# Patient Record
Sex: Female | Born: 1970 | Race: White | Hispanic: No
Health system: Midwestern US, Academic
[De-identification: ages and names within clinical notes are randomized; demographics above are authoritative.]

---

## 2014-03-22 IMAGING — MG SCR BIL
5 series · 5 of 5 positions shown · non-contrast
Comparison: February 29, 2008.
COMPARISON: February 29, 2008.

------------- REPORT [id number] -------------
DIGITAL MAMMOGRAM

INDICATIONS:
Screening
Family history OF carcinoma of the breast in maternal aunt.
TECHNIQUE: Four standard projections plus supplemental left MLO projection with CAD.

[RCC]
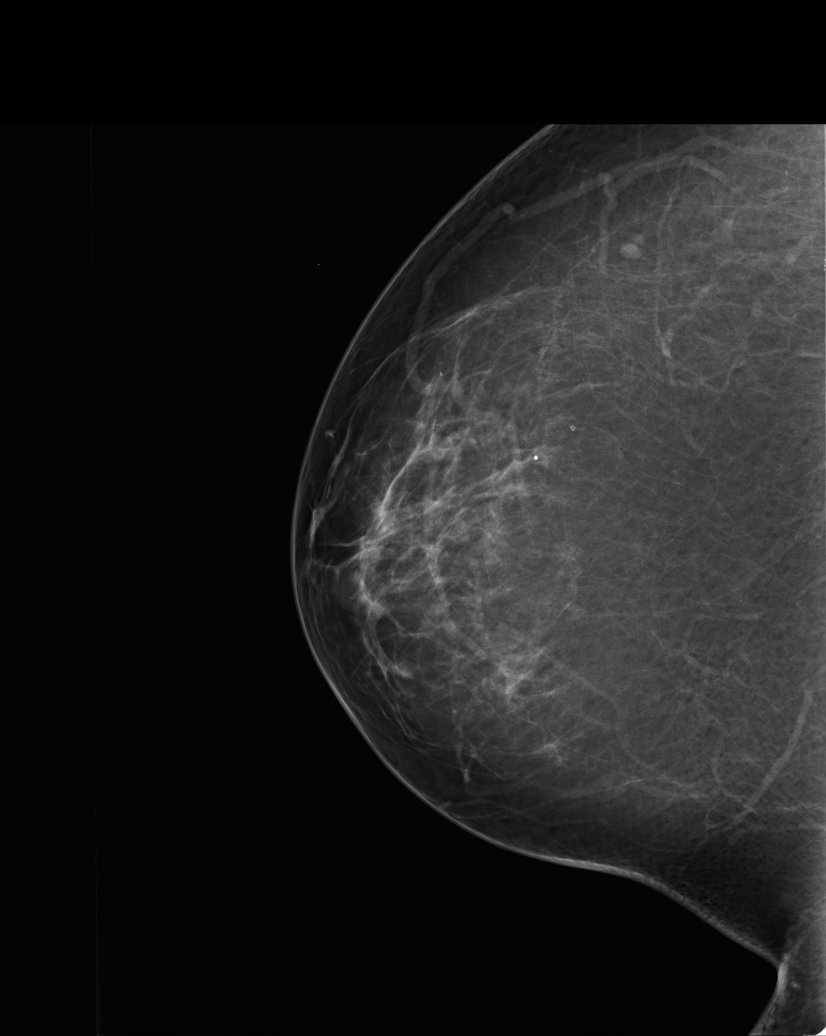

[LCC]
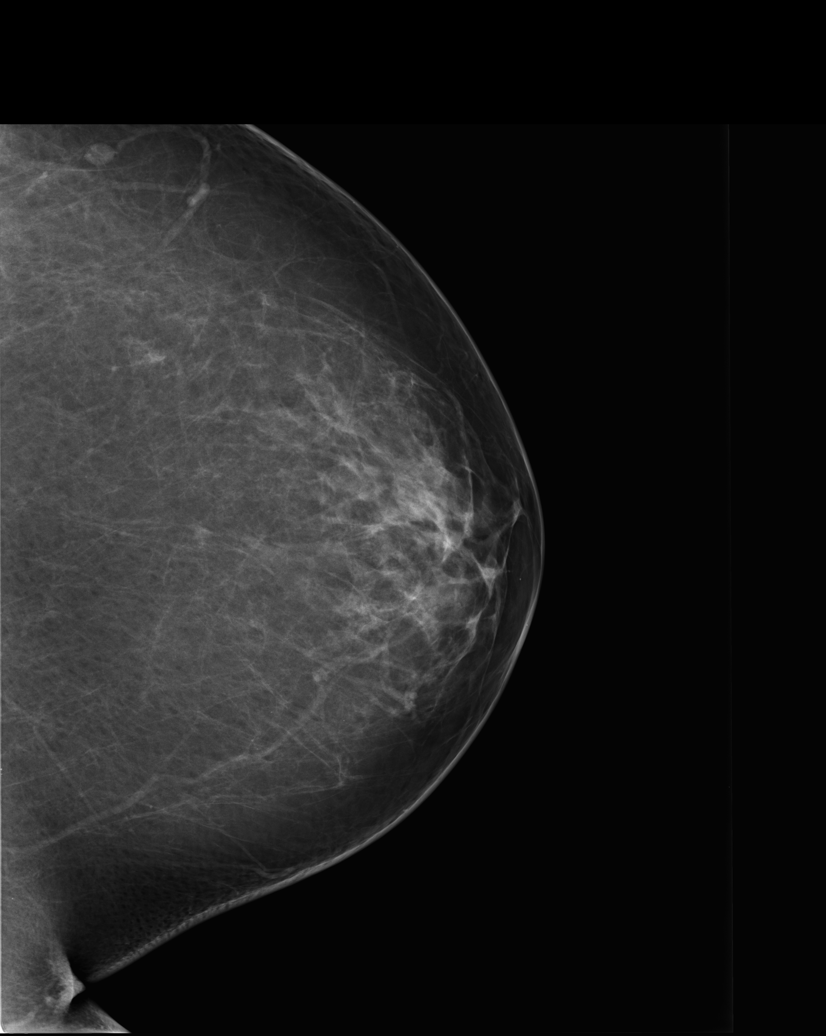

[RMLO]
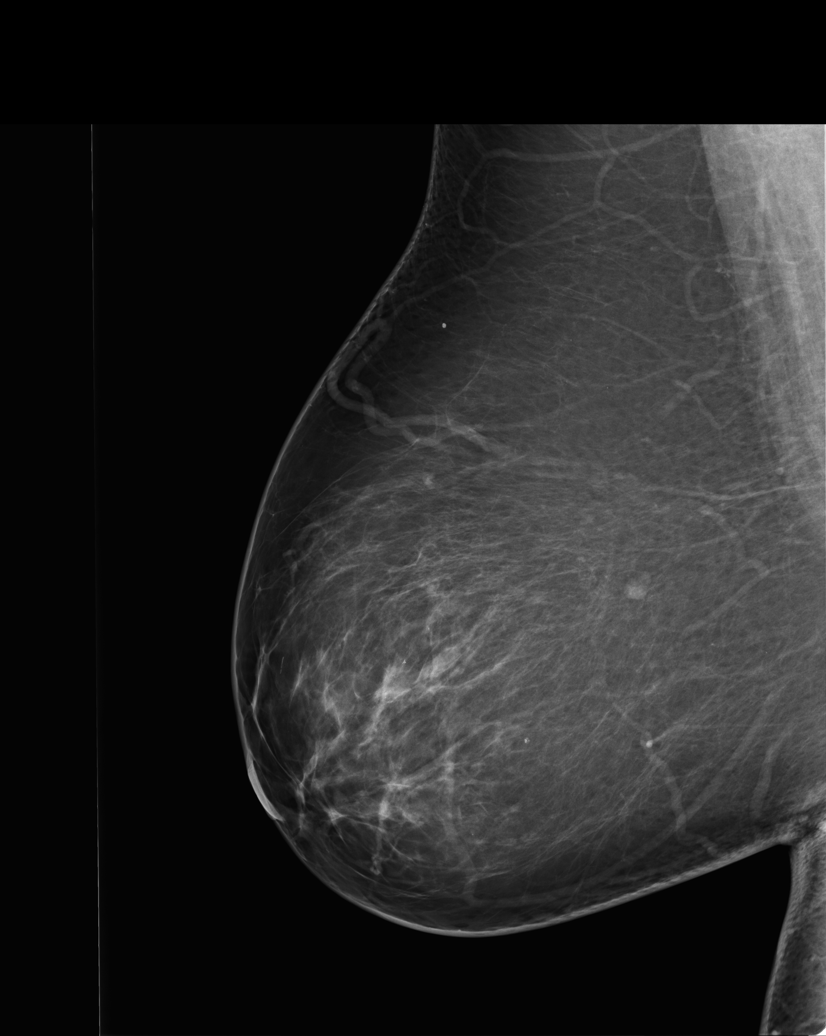

[LMLO (1 of 2)]
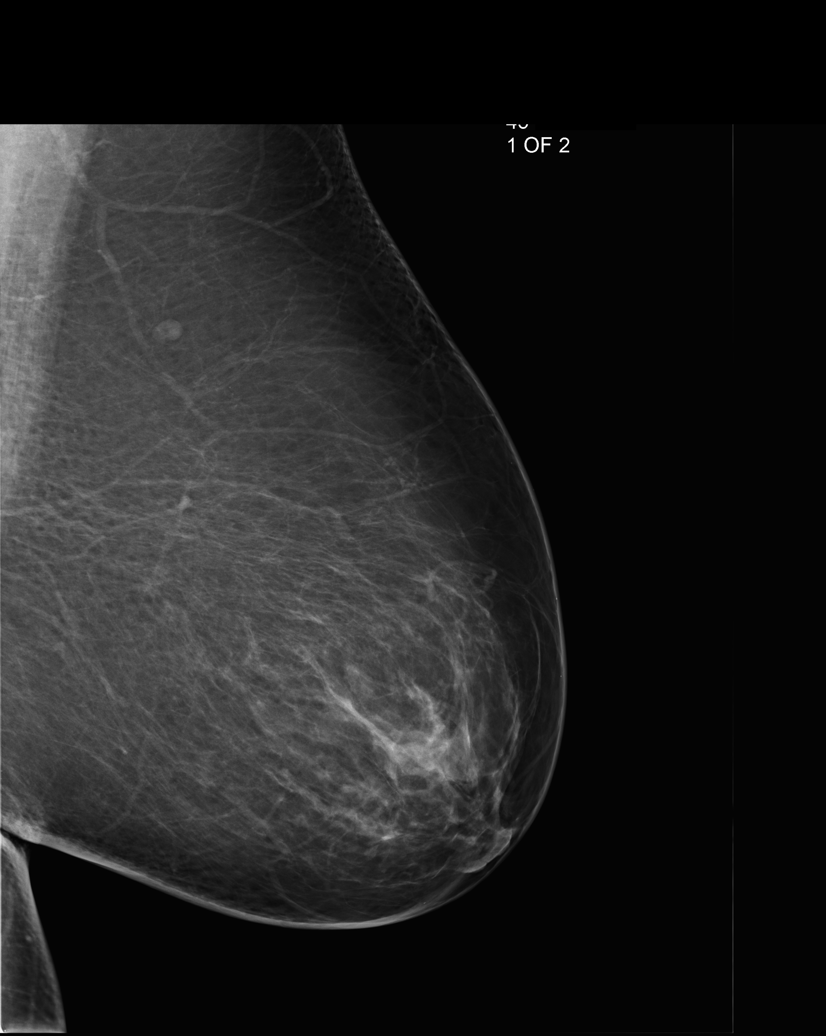

[LMLO (2 of 2)]
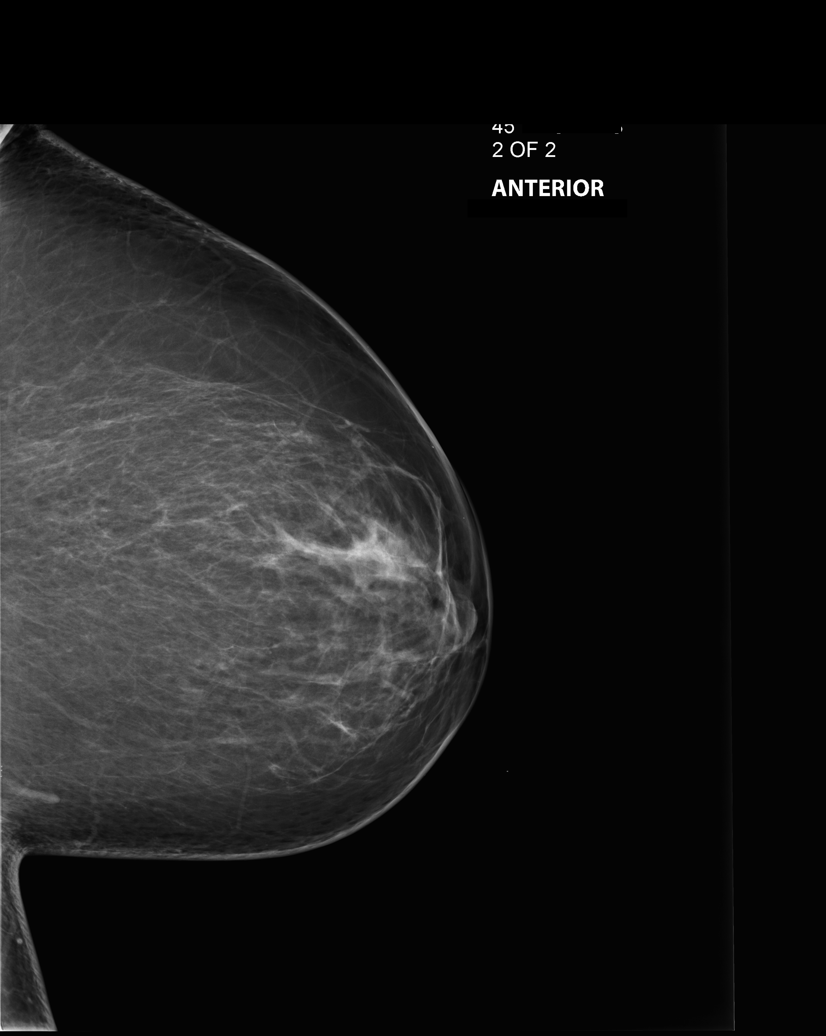

[5 of 5 positions shown; findings below may reference images not displayed]

FINDINGS: The breasts are largely fatty replaced
There are a few scattered involutional calcifications.
There are stable small intraparenchymal lymph nodes of the upper outer
quadrant of the left breast and lateral aspect of the right breast.
There is no new, dominant, nor suspicious parenchymal asymmetry or
calcification.
IMPRESSION: Benign findings.
Annual mammographic follow up is recommended.
The absence of a significant mammographic finding should not delay biopsy of
any clinically suspicious lesion.

Job:  [id number]

BI-RADS:  2 - BENIGN FINDING

FOLLOWUP:  12 MONTH FOLLOW-UP

Tech Notes: FM HX.:  MATERNAL AUNT DECEASED IN HER 30s - HAD BRAIN AND BREAST CA- NOT SURE WHICH
WAS FIRST.  AB
(LB5)

------------- REPORT [id number] -------------
DIGITAL MAMMOGRAM

INDICATIONS:
Screening
Family history OF carcinoma of the breast in maternal aunt.
FINDINGS: The breasts are largely fatty replaced
There are a few scattered involutional calcifications.
There are stable small intraparenchymal lymph nodes of the upper outer
quadrant of the left breast and lateral aspect of the right breast.
There is no new, dominant, nor suspicious parenchymal asymmetry or
calcification.
IMPRESSION: Benign findings.
Annual mammographic follow up is recommended.
The absence of a significant mammographic finding should not delay biopsy of
any clinically suspicious lesion.

Job:  [id number]

BI-RADS:  2 - BENIGN FINDING

FOLLOWUP:  12 MONTH FOLLOW-UP

Tech Notes: FM HX.:  MATERNAL AUNT DECEASED IN HER 30s - HAD BRAIN AND BREAST CA- NOT SURE WHICH
WAS FIRST.  AB
(LB5)

## 2014-09-23 IMAGING — CT Abdomen^1_ABDOMEN_PELVIS_WITHOUT (Adult)
1 series · 15 of 32 positions shown, 19 images · non-contrast
Comparison: none

PROCEDURE: ABDOMEN 1_ABDOMEN_PELVIS_WITHOUT (ADULT)
HISTORY: RT SIDE ABD PAIN. HX OF CHOLE, APPY, AND HYSTERECTOMY.
TECHNIQUE: Axial CT imaging of the abdomen and pelvis was performed without contrast.

[Series 2: abd/pelvis w/o 5.0 soft tissue · axial · non-contrast · 0.88mm/px · z∈[-487,-67]mm · 15 of 94 slices shown, 19 images]
[im 7/94  soft-tissue]
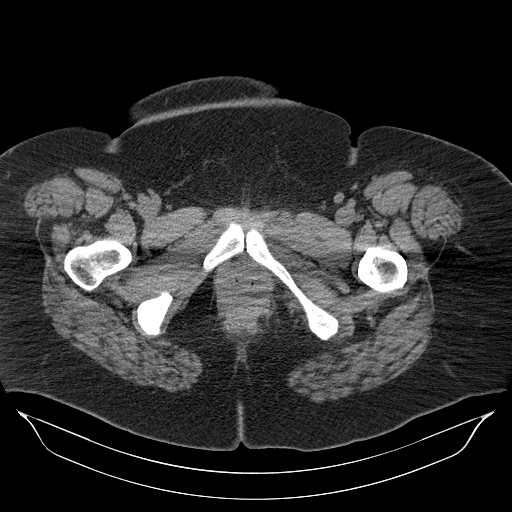
[im 7/94  bone]
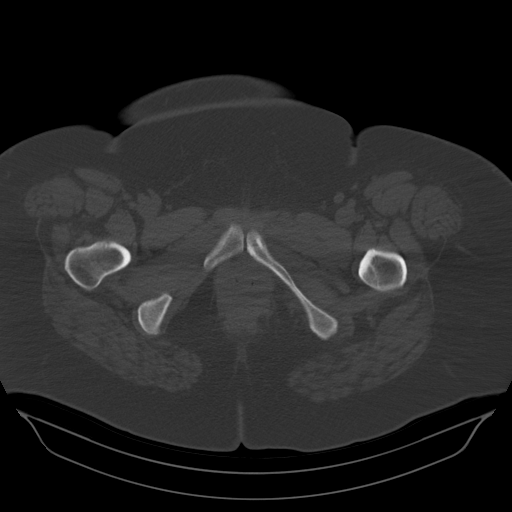
[im 13/94  soft-tissue]
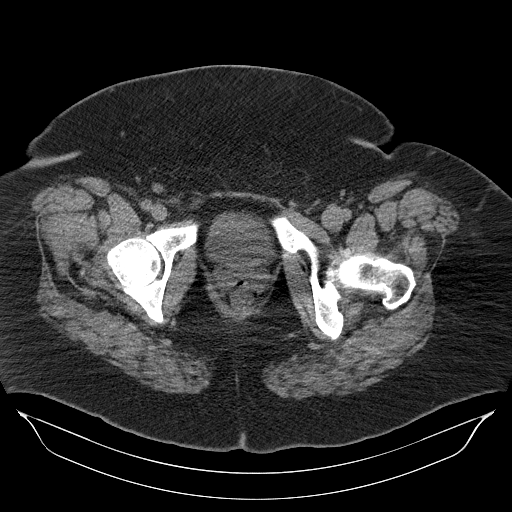
[im 19/94  soft-tissue]
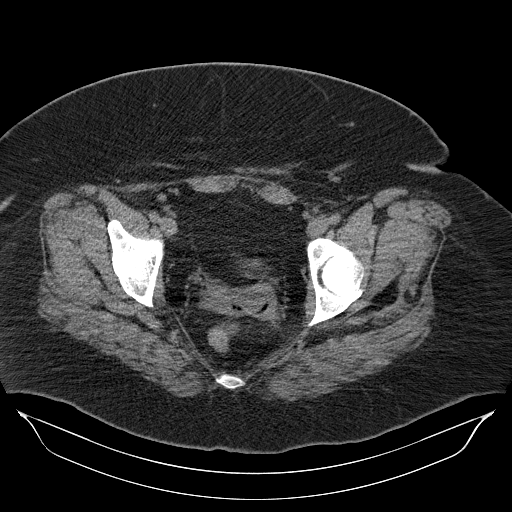
[im 28/94  soft-tissue]
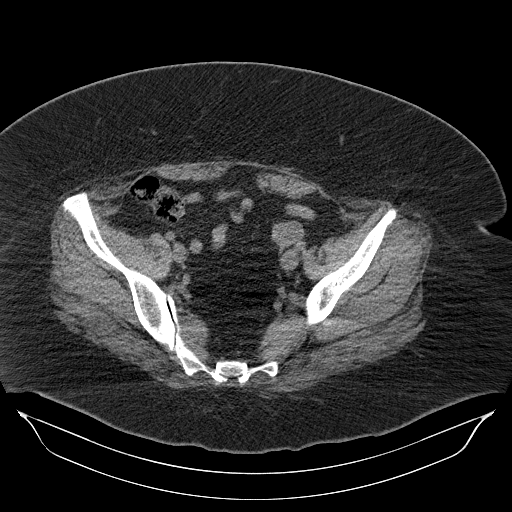
[im 34/94  soft-tissue]
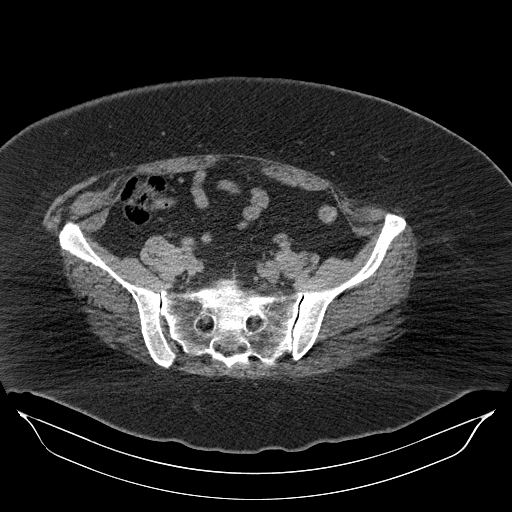
[im 40/94  soft-tissue]
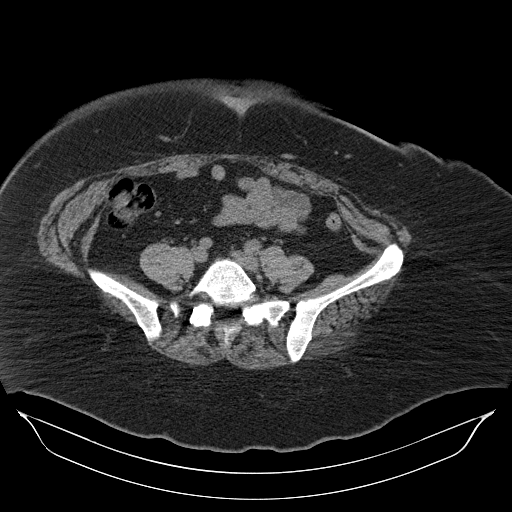
[im 49/94  soft-tissue]
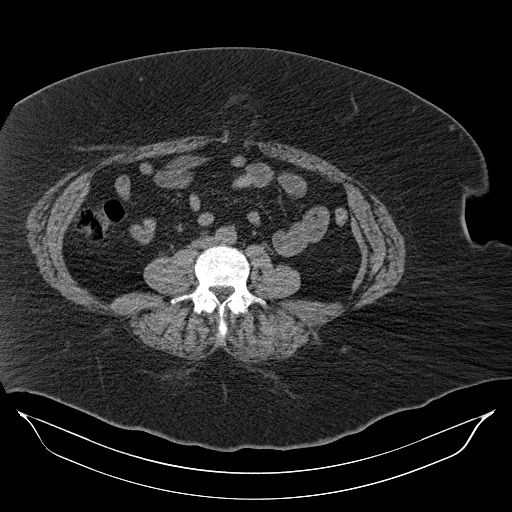
[im 55/94  soft-tissue]
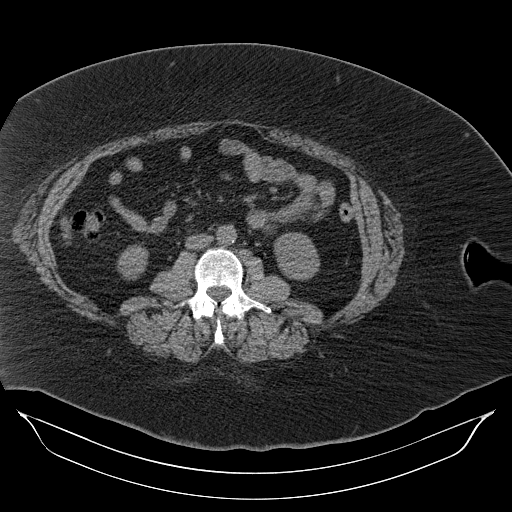
[im 61/94  soft-tissue]
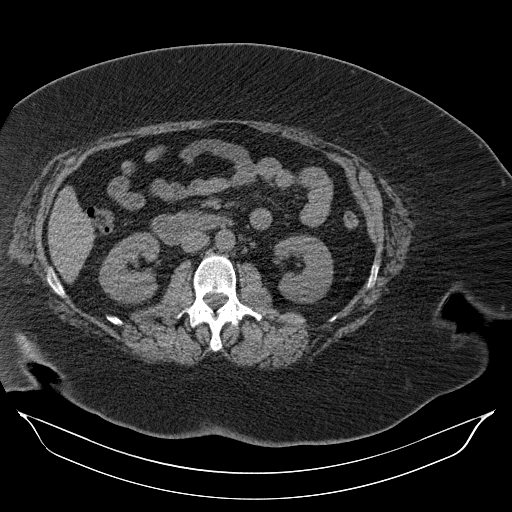
[im 61/94  bone]
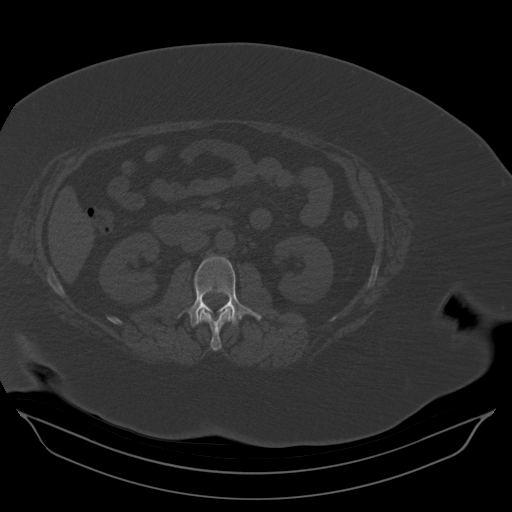
[im 67/94  soft-tissue]
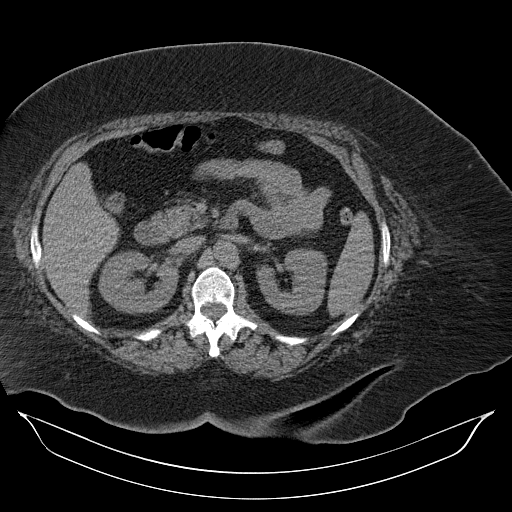
[im 76/94  soft-tissue]
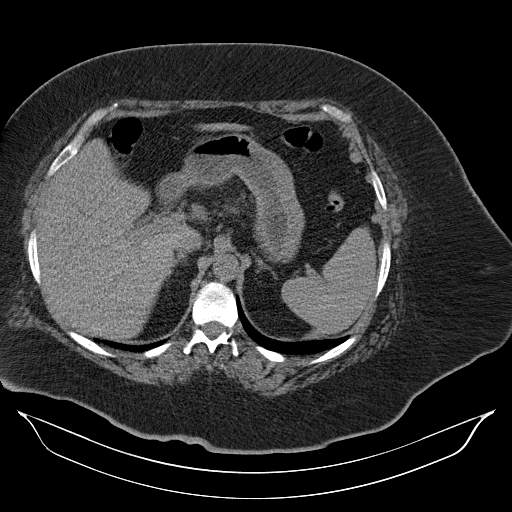
[im 82/94  soft-tissue]
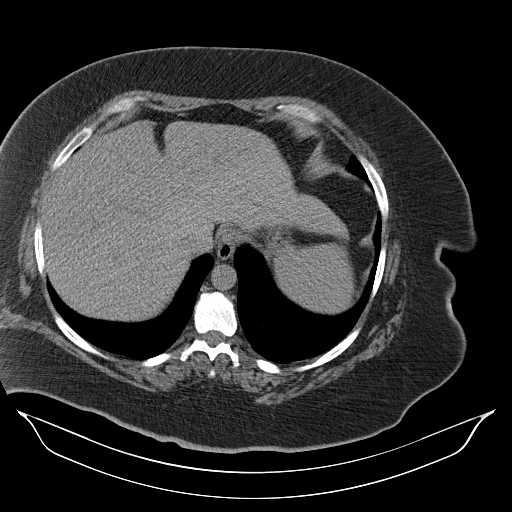
[im 82/94  lung]
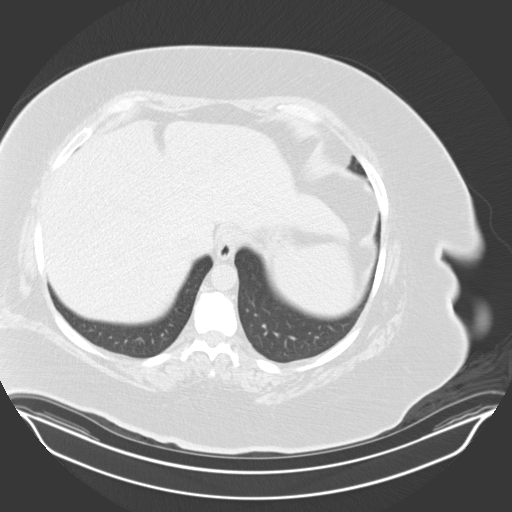
[im 85/94  lung]
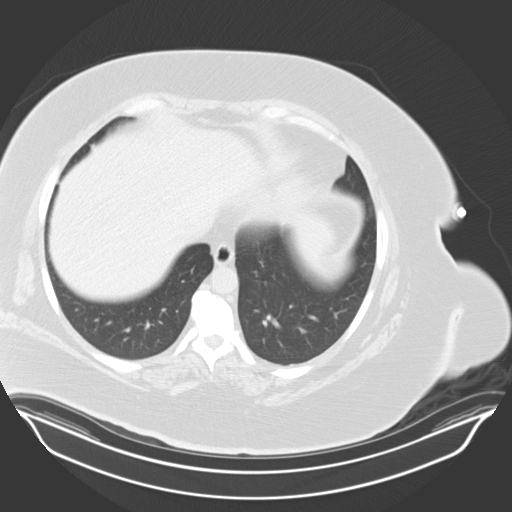
[im 88/94  soft-tissue]
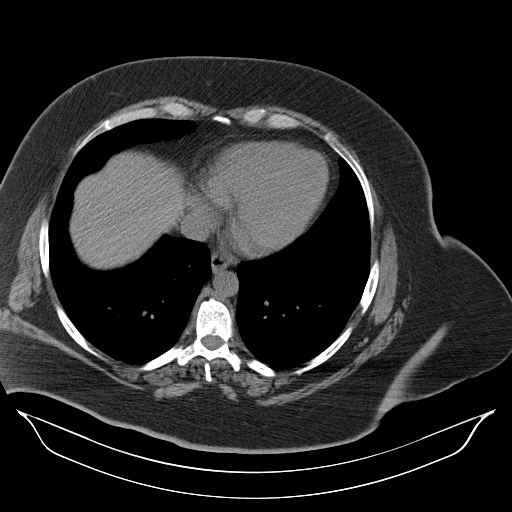
[im 88/94  lung]
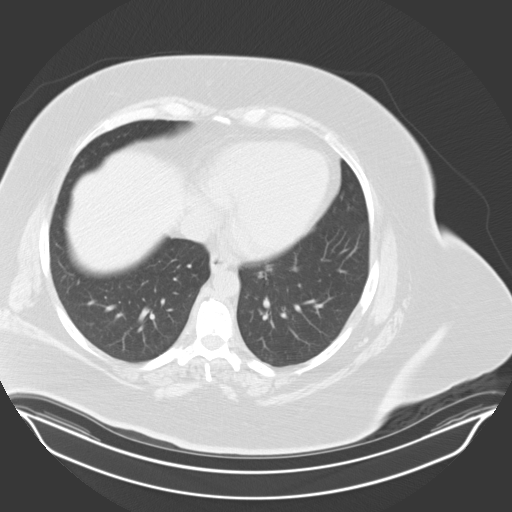
[im 91/94  lung]
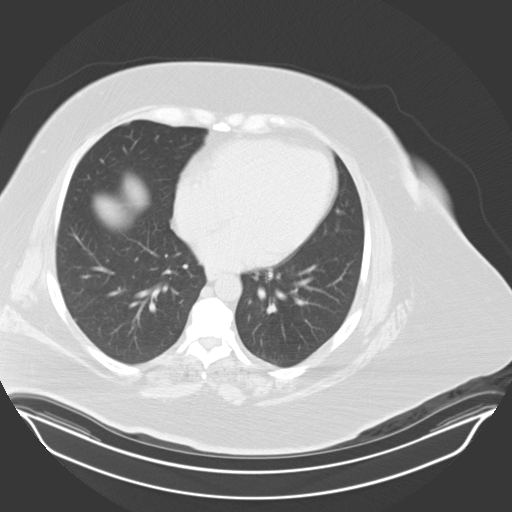

[15 of 32 positions shown; findings below may reference images not displayed]

FINDINGS: The lung bases are clear. Surgical changes are seen reflecting cholecystectomy. The noncontrast
visualized portions of the liver, spleen, gallbladder, pancreas, and adrenals are within normal
limits.
The right kidney shows no hydronephrosis, perinephric fat stranding, or obstructing renal calculi.
The
left kidney shows no hydronephrosis, perinephric fat stranding, or obstructing renal calculi. The
aorta
is normal in course and caliber. No pathologically dilated loops of large or small bowel are seen.
Findings are seen consistent with appendectomy. There is no free air or free fluid seen in the
abdomen. The noncontrast urinary bladder is unremarkable.
IMPRESSION: 1. There is no hydronephrosis, perinephric fat stranding, or obstructing renal calculi.
2. Nonobstructive bowel gas pattern without free air.

Tech Notes: RT SIDE ABD PAIN. HX OF CHOLE, APPY, HYSTERECTOMY. TM

## 2017-01-18 IMAGING — CR LOW_EXM
3 series · 3 of 3 positions shown · non-contrast
Comparison: none

[ankle ap]
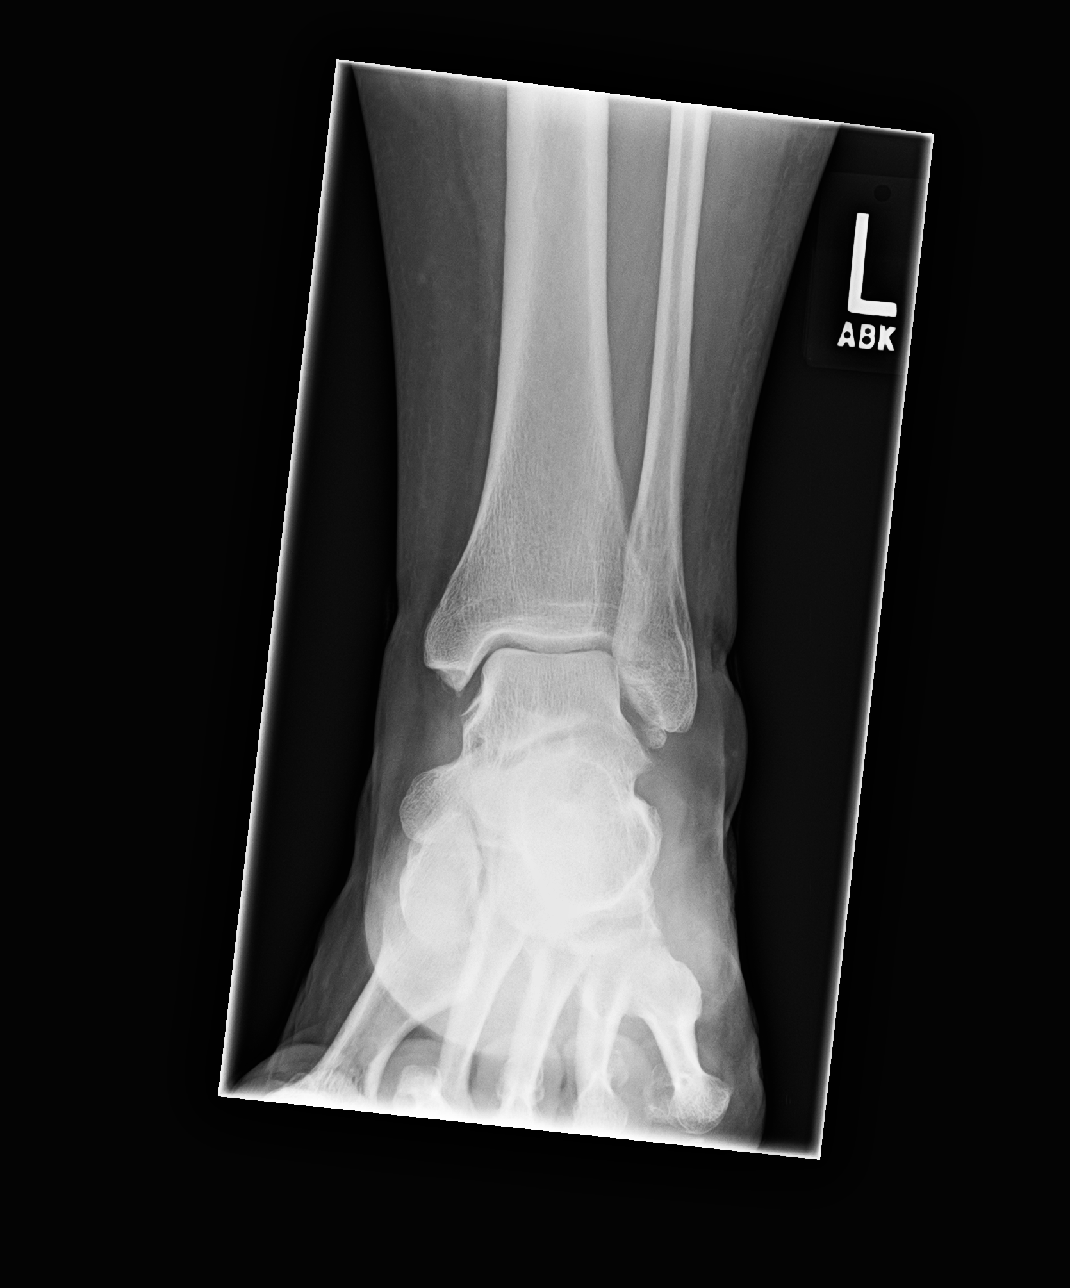

[ankle obl]
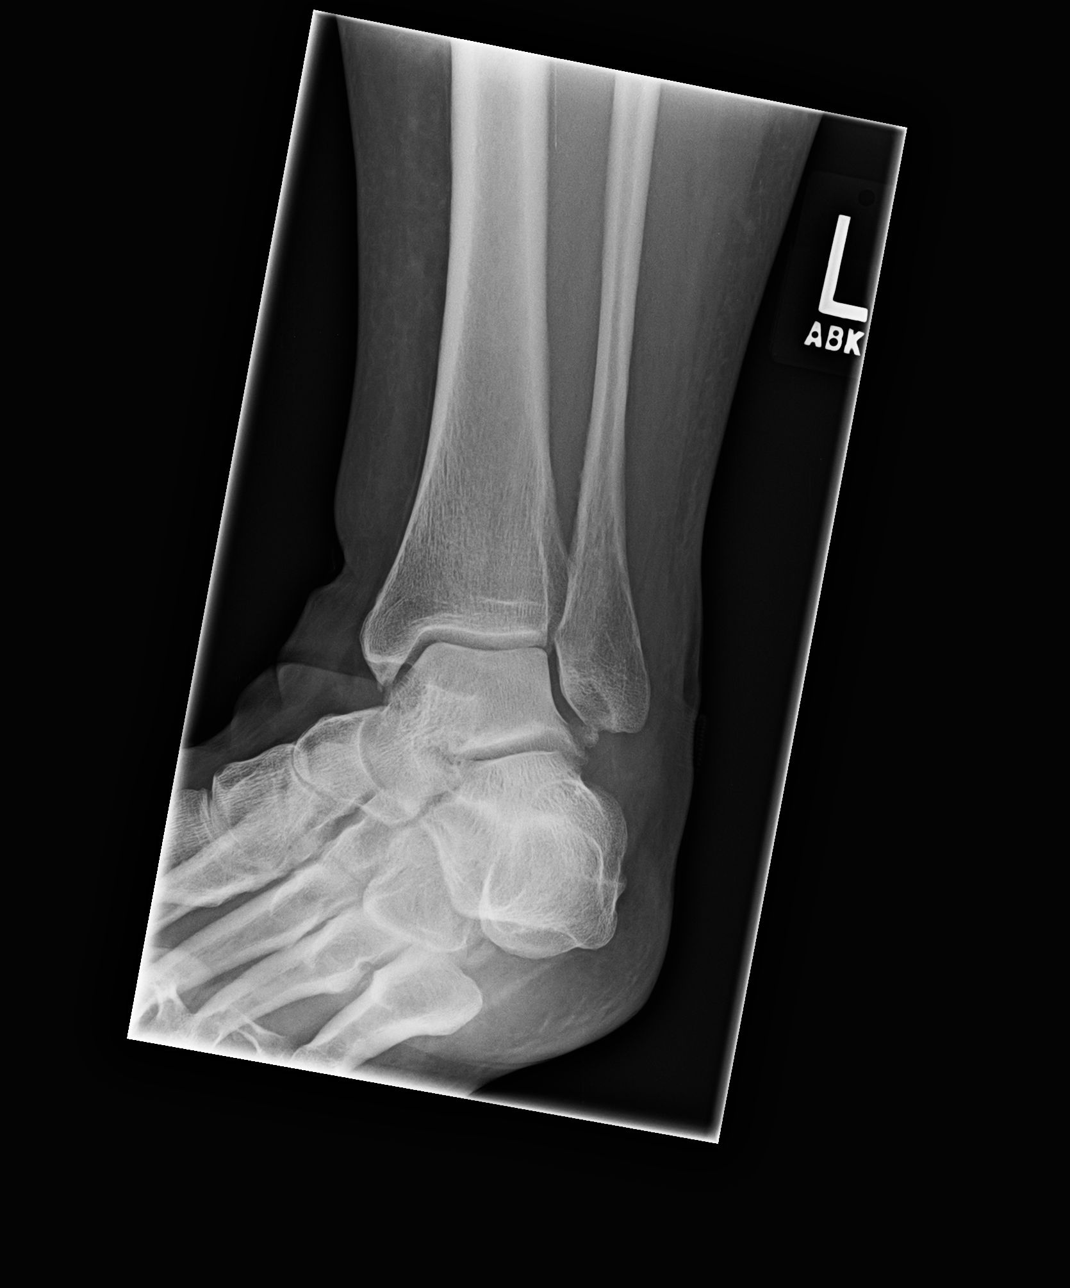

[ankle lat]
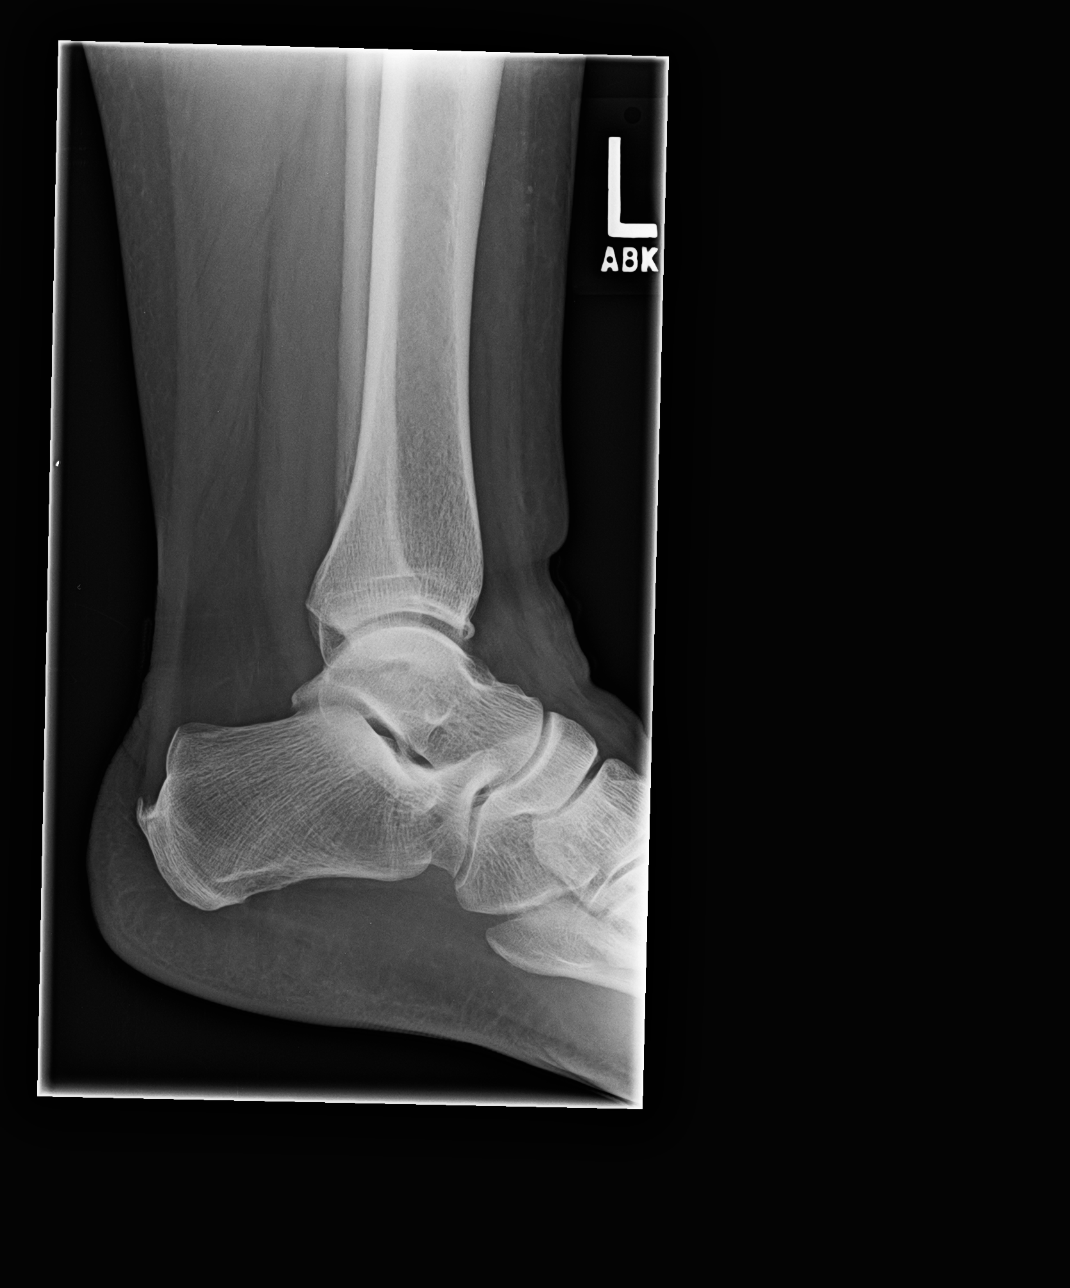

[3 of 3 positions shown; findings below may reference images not displayed]

EXAM

RADIOLOGIC EXAMINATION, ANKLE; 3 OR MORE VIEWS, COMPLETE CPT 19217

INDICATION

pain post fall
FELL AND TWISTED LEFT ANKLE, PAIN LATERALLY - AK

TECHNIQUE

AP, lateral and oblique views of the left ankle were acquired.

COMPARISONS

There are no previous examinations available at the time of dictation

FINDINGS

There is soft tissue swelling of the left ankle. There are no acute fractures. There are no
subluxations. Accessory ossicles are identified along the tip of the lateral malleolus. Calcaneal
spurs are identified along the insertion of the Achilles tendon and plantar fascia. The ankle
mortise appears intact. There is no evidence of an ankle joint effusion.

IMPRESSION

Soft tissue swelling of the left ankle suggestive of a left ankle sprain. No acute fractures.
Calcaneal spurs.

## 2018-07-07 IMAGING — MG MAMMOGRAM 3D SCREEN, BILATERAL
11 of 16 series · 11 of 16 positions shown · non-contrast
Comparison: none

[R CC (1 of 2)]
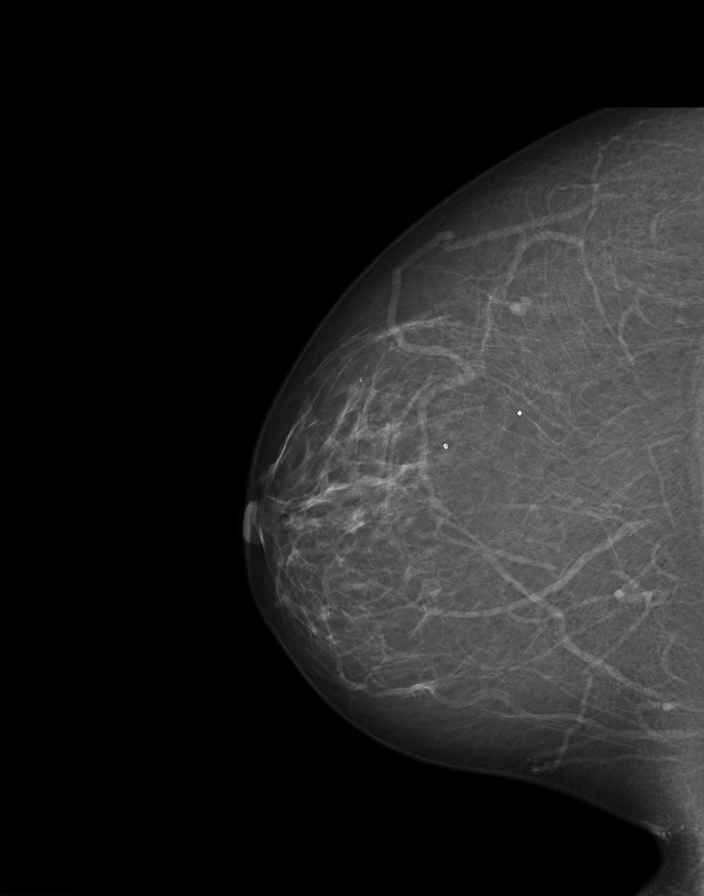

[R tomo (1 of 2)]
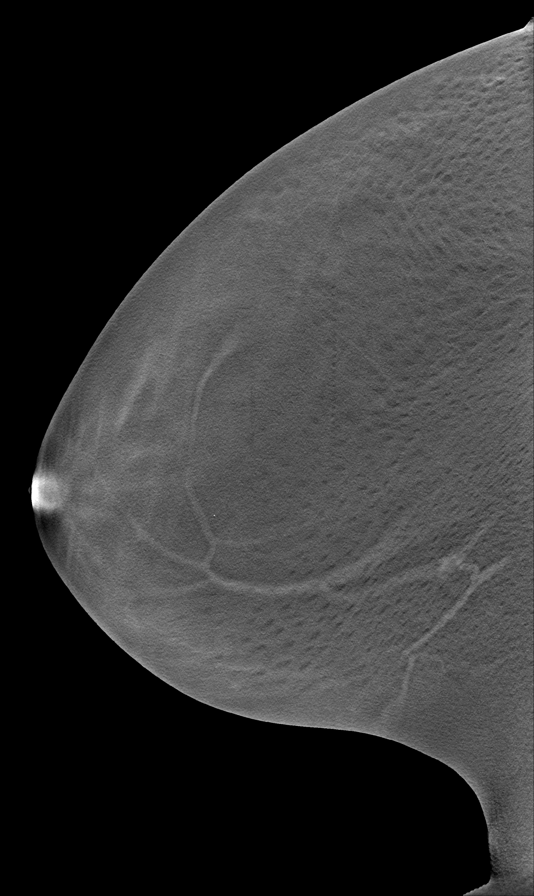

[R CC (2 of 2)]
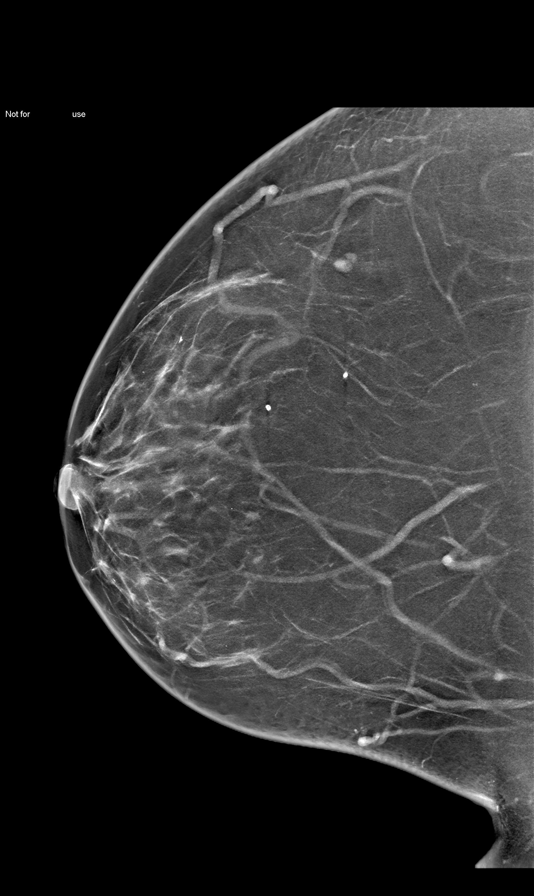

[R]
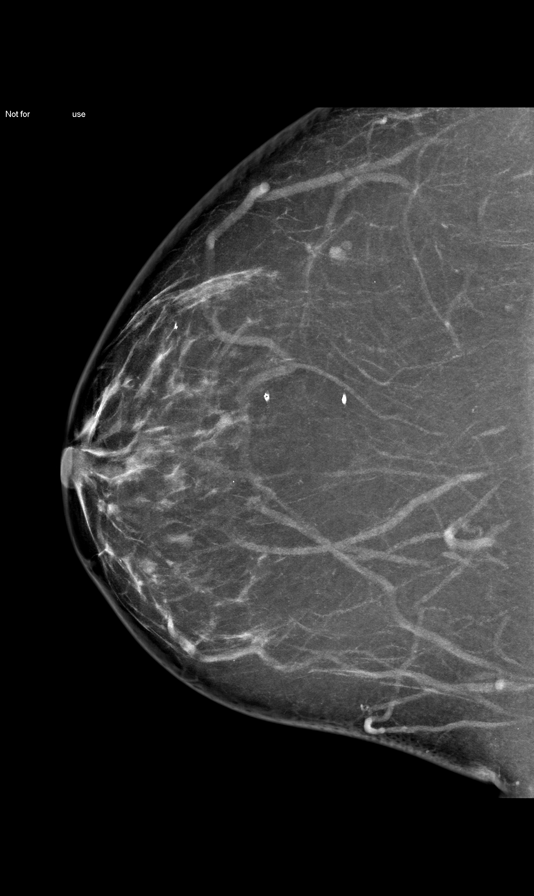

[L CC (1 of 2)]
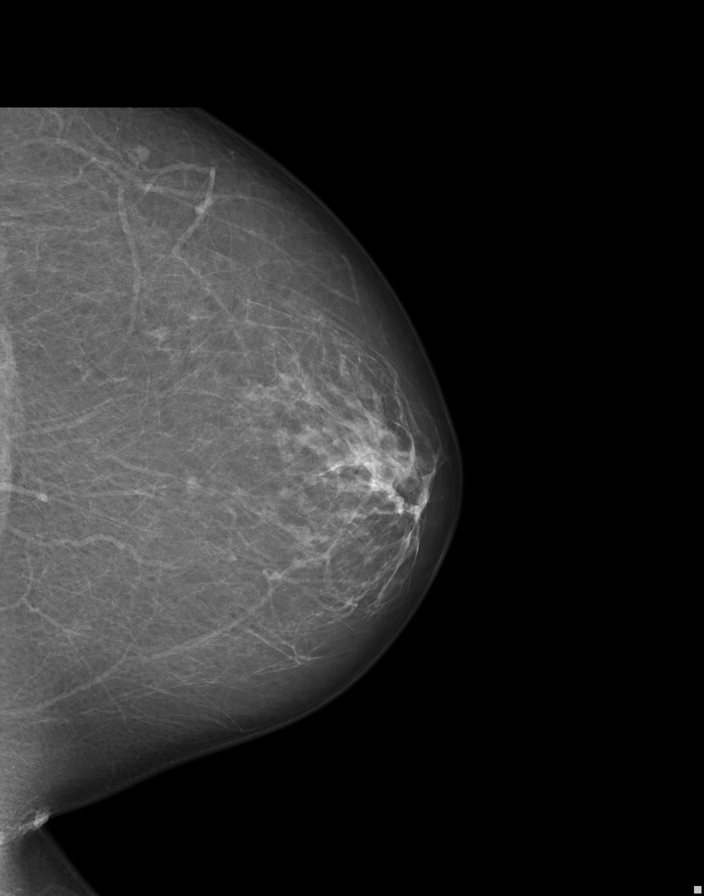

[L tomo]
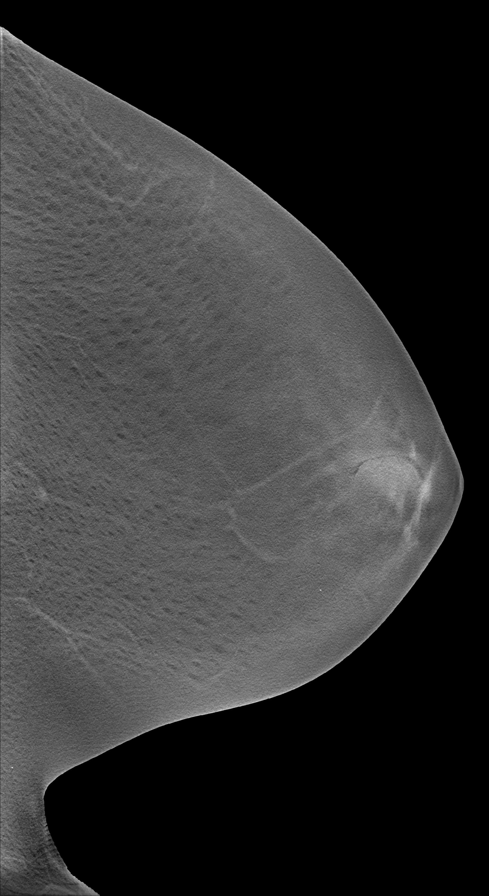

[L CC (2 of 2)]
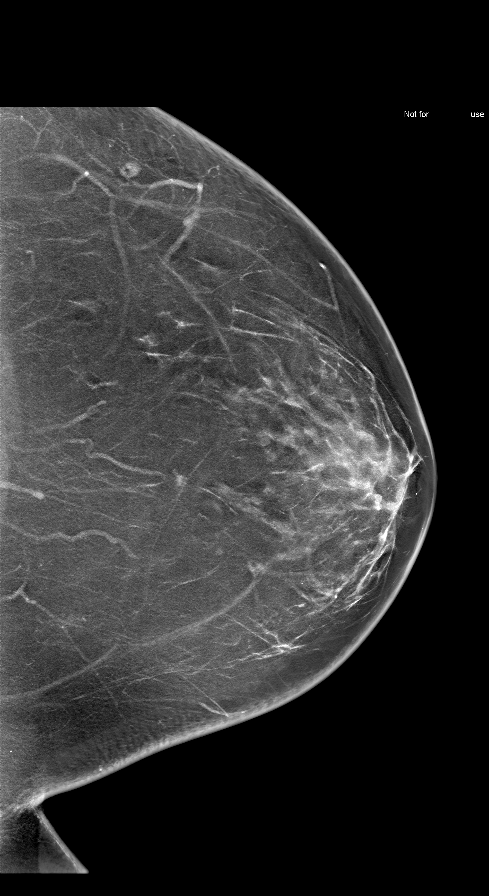

[L]
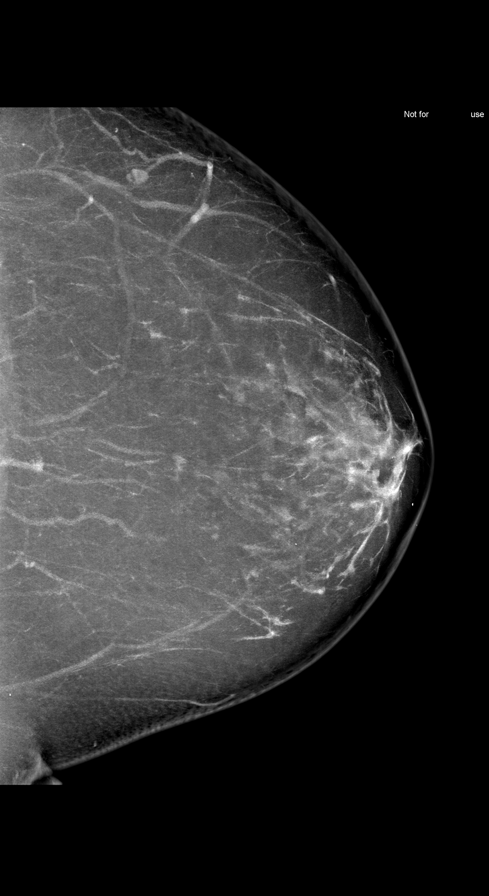

[R MLO (1 of 2)]
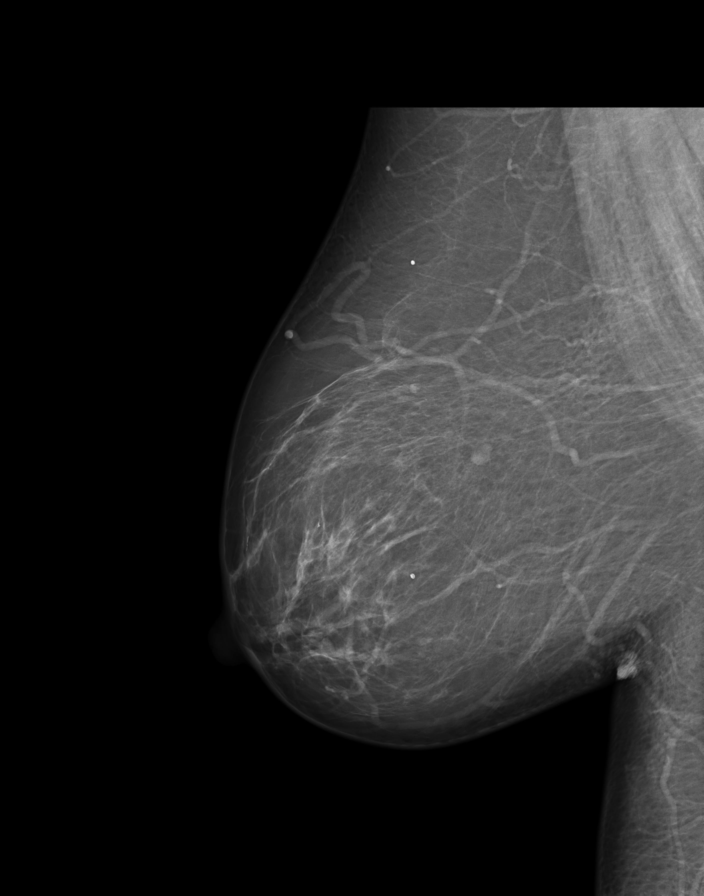

[R tomo (2 of 2)]
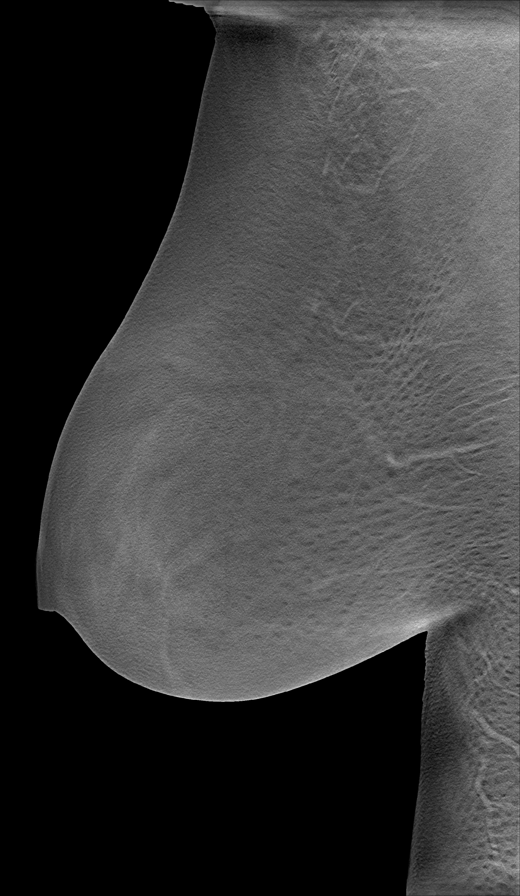

[R MLO (2 of 2)]
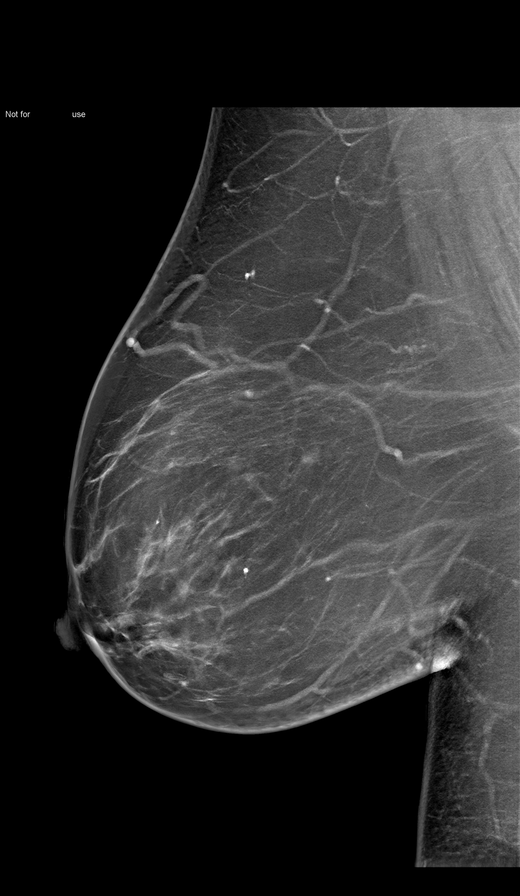

[11 of 16 positions shown; findings below may reference images not displayed]

EXAM
MM mammogram 3D screen bilat

INDICATION
Screening

TECHNIQUE
2D and 3D tomosynthesis digital craniocaudal and mediolateral oblique views were obtained of both
breasts. Computer aided detection software was utilized.

COMPARISONS
08/28/15

FINDINGS
The breasts are almost entirely fatty.

No suspicious microcalcification, mass, architectural distortion, or asymmetry.

IMPRESSION
BI-RADS 1, NEGATIVE.
A reminder letter will be sent.

Tech Notes:

## 2018-08-26 ENCOUNTER — Encounter: Admit: 2018-08-26 | Discharge: 2018-08-26 | Payer: Commercial Managed Care - PPO

## 2018-08-26 ENCOUNTER — Ambulatory Visit: Admit: 2018-08-26 | Discharge: 2018-08-31 | Payer: Commercial Managed Care - PPO

## 2018-08-26 ENCOUNTER — Inpatient Hospital Stay: Admit: 2018-08-26 | Discharge: 2018-08-26 | Payer: Commercial Managed Care - PPO

## 2018-08-26 DIAGNOSIS — G9389 Other specified disorders of brain: ICD-10-CM

## 2018-08-26 LAB — COMPREHENSIVE METABOLIC PANEL
Lab: 0.5 mg/dL (ref 0.3–1.2)
Lab: 0.7 mg/dL (ref 0.4–1.00)
Lab: 12 mg/dL (ref 7–25)
Lab: 138 MMOL/L (ref 137–147)
Lab: 18 U/L (ref 7–56)
Lab: 25 MMOL/L (ref 21–30)
Lab: 27 U/L (ref 7–40)
Lab: 3.6 g/dL (ref 3.5–5.0)
Lab: 40 U/L (ref 25–110)
Lab: 6.4 g/dL (ref 6.0–8.0)
Lab: 8 K/UL (ref 3–12)
Lab: 8.4 mg/dL — ABNORMAL LOW (ref 8.5–10.6)
Lab: 98 mg/dL (ref 70–100)
Lab: >60 mL/min (ref >60)
Lab: >60 mL/min (ref >60)

## 2018-08-26 LAB — PHOSPHORUS: Lab: 3 mg/dL (ref 2.0–4.5)

## 2018-08-26 LAB — PROTIME INR (PT): Lab: 1.1 M/UL (ref 0.8–1.2)

## 2018-08-26 LAB — PTT (APTT): Lab: 20 s — ABNORMAL LOW (ref 24.0–36.5)

## 2018-08-26 LAB — MAGNESIUM: Lab: 1.8 mg/dL (ref 1.6–2.6)

## 2018-08-26 LAB — CBC AND DIFF: Lab: 8.9 10*3/uL (ref 4.5–11.0)

## 2018-08-26 IMAGING — CT brain
3 of 4 series · 14 of 47 positions shown, 16 images · non-contrast
Comparison: none

[Series 2: brain 5.00 hr40 s3 ax ibhc · axial · 0.32mm/px · z∈[-434,-323]mm · 8 of 32 slices shown, 10 images]
[im 3/32  brain]
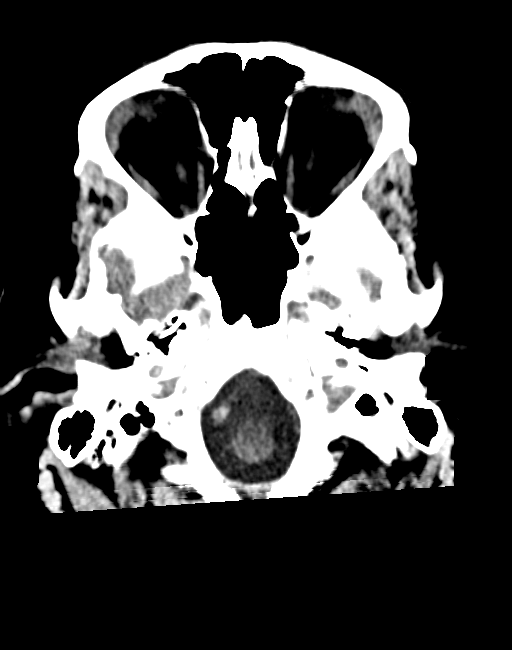
[im 3/32  bone]
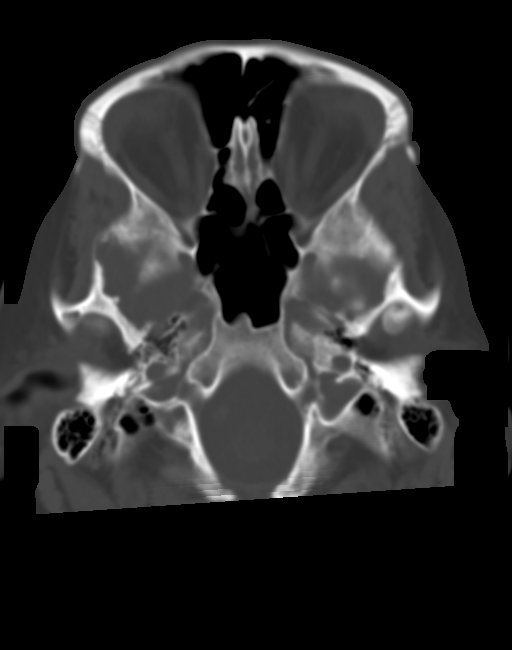
[im 7/32  brain]
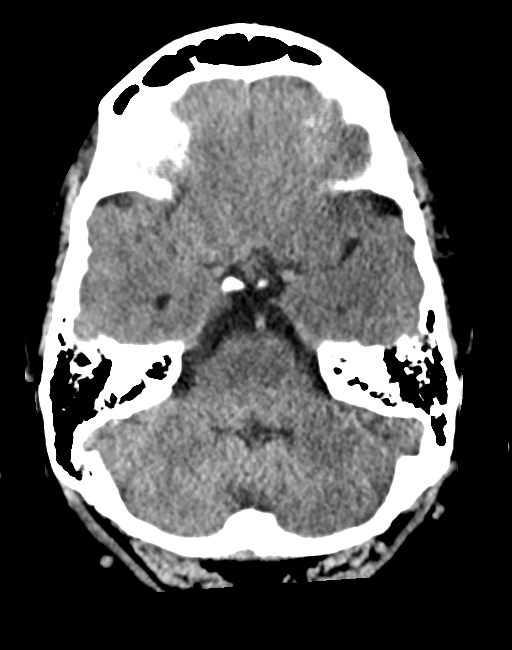
[im 12/32  brain]
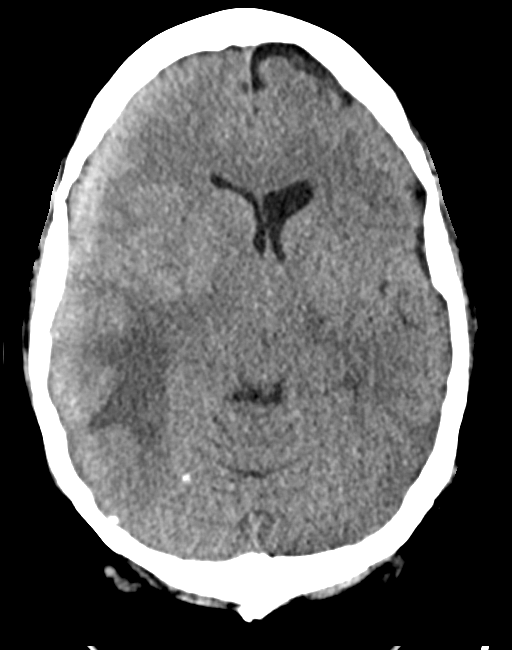
[im 14/32  brain]
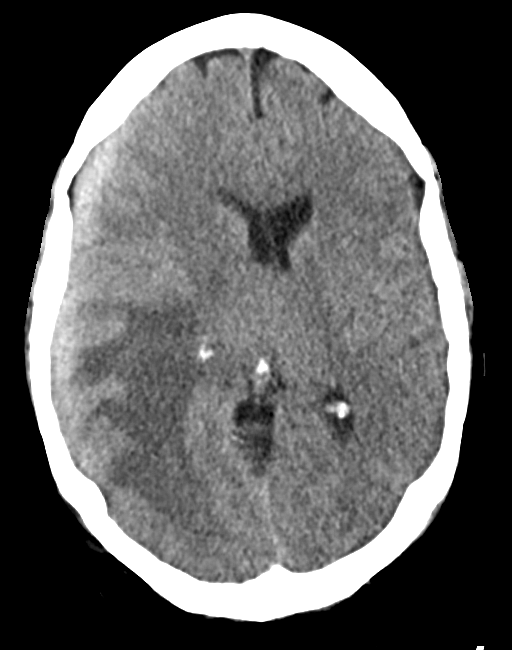
[im 18/32  brain]
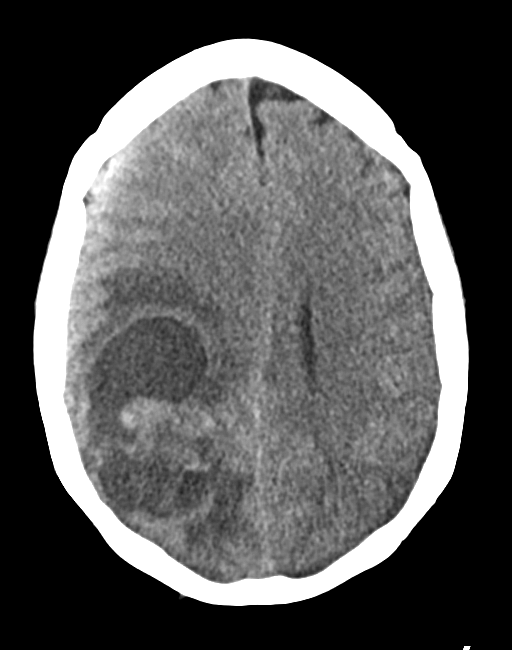
[im 18/32  bone]
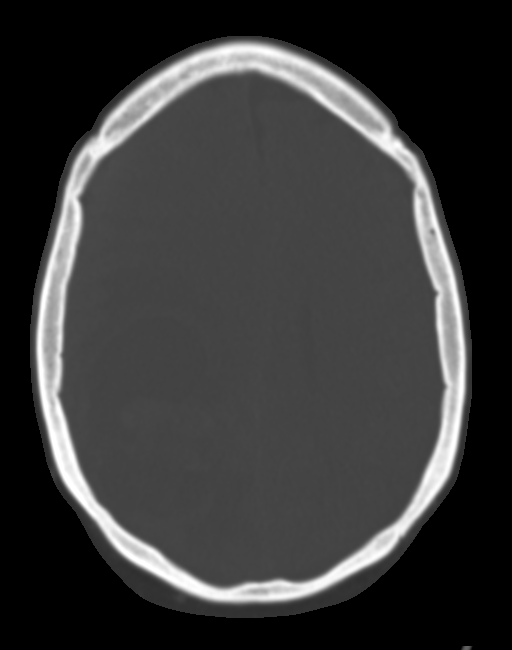
[im 20/32  brain]
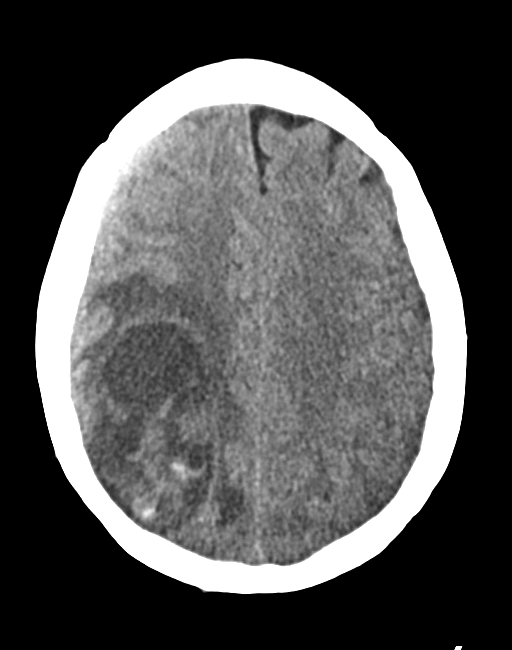
[im 25/32  brain]
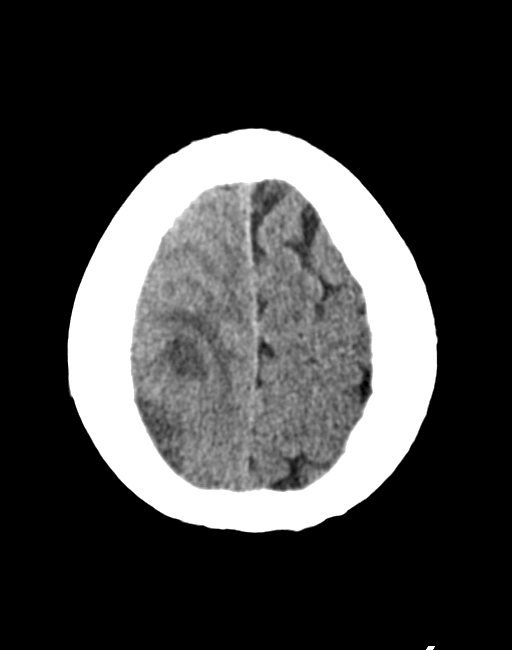
[im 29/32  brain]
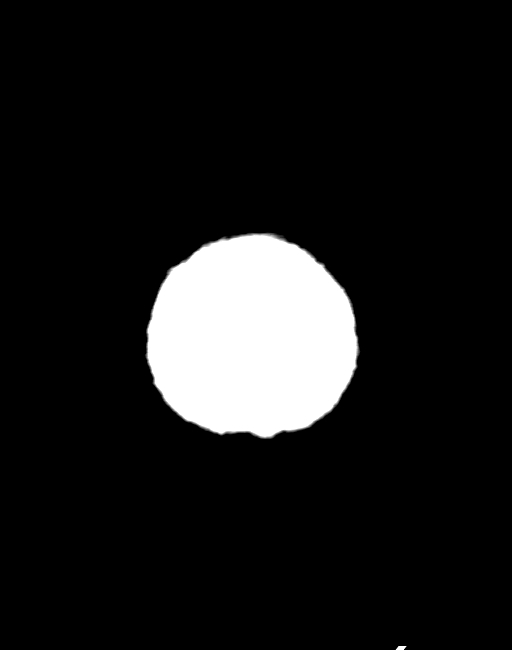

[Series 8: brain 5.00 hr40 s3 cor ibhc · coronal · 0.31mm/px · 3 of 41 slices shown]
[im 16/41  brain]
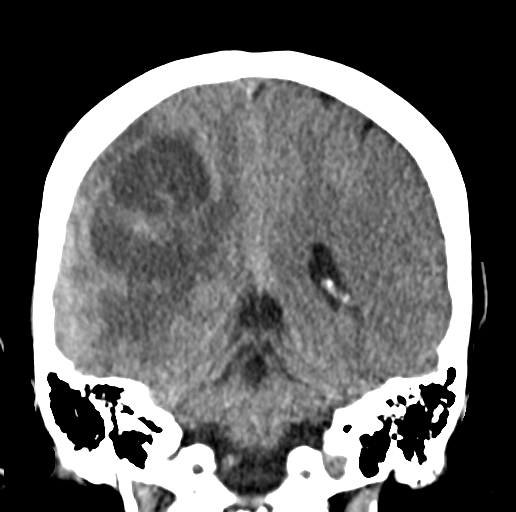
[im 19/41  brain]
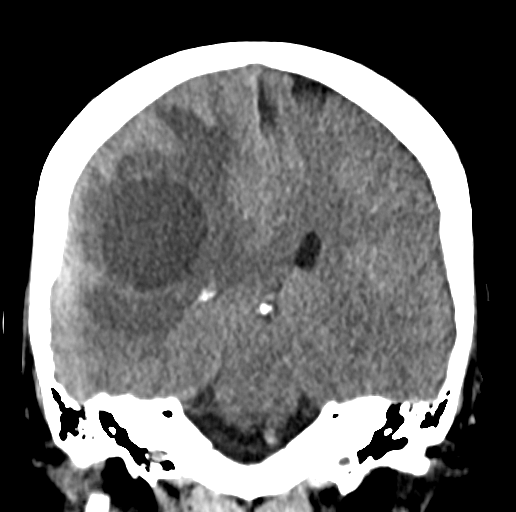
[im 22/41  brain]
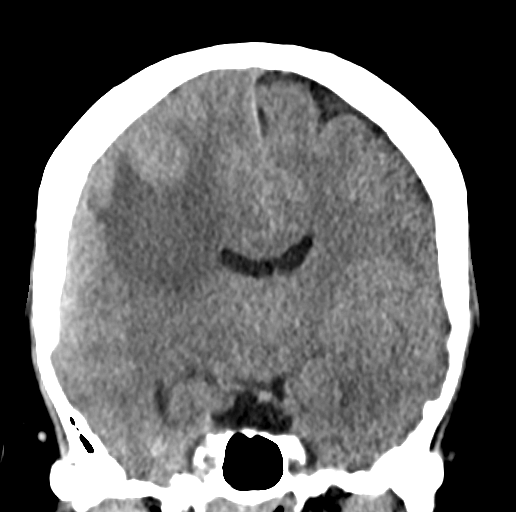

[Series 10: brain 5.00 hr40 s3 sag ibhc · sagittal · 0.31mm/px · 3 of 32 slices shown]
[im 11/32  brain]
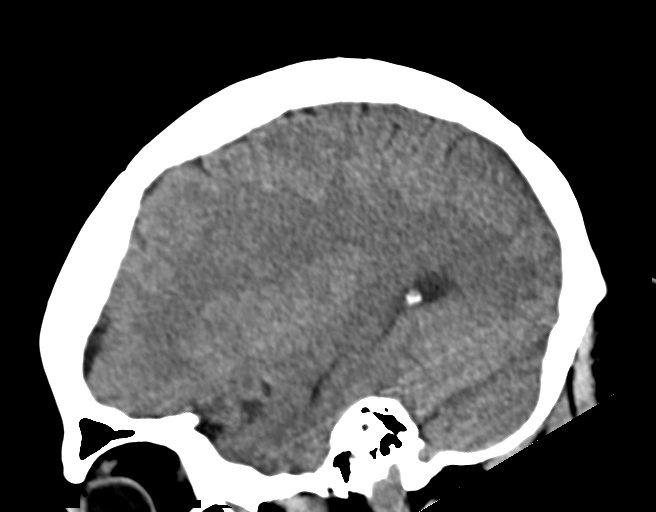
[im 16/32  brain]
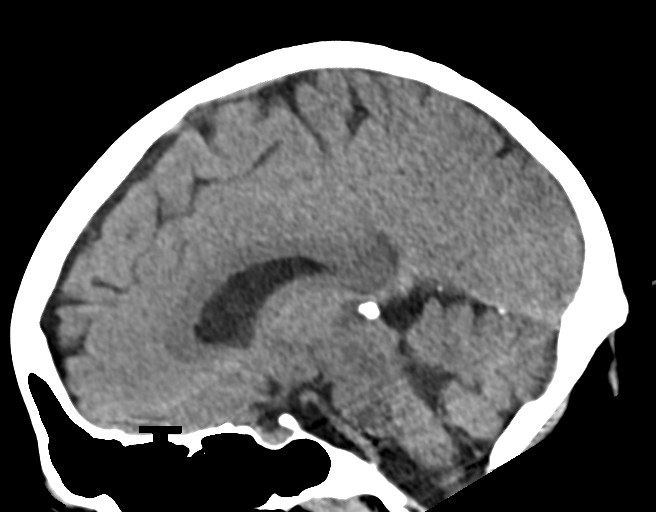
[im 21/32  brain]
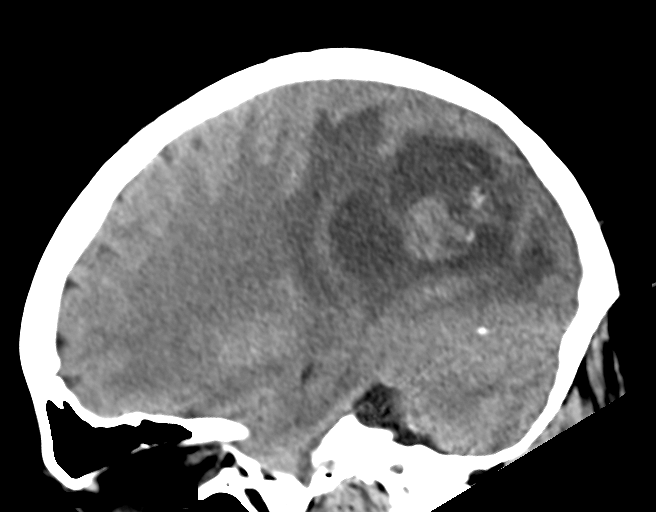

[14 of 47 positions shown; findings below may reference images not displayed]

EXAM
Sinus CT.

INDICATION
sinus pain
PT WOKE UP WITH HEADACHE, NAUSEA, NOW CONFUSION, WEAKNESS.

TECHNIQUE
Noncontrast CT scan of the paranasal sinuses. All CT scans at this facility use dose modulation,
iterative reconstruction, and/or weight based dosing when appropriate to reduce radiation dose to as
low as reasonably achievable.

COMPARISONS
Head CT from same date.

FINDINGS
Masslike process in the right posterior cerebrum/parietal lobe, better described on recent CT scan.

There is evidence of extensive sinus surgery with uncinate tectum a, ethmoidectomy surgery. There
is also takedown of the frontoethmoidal recesses. There is no evidence of acute paranasal sinus
disease. Minimal/trace mucosal thickening is noted in a few areas in the maxillary sinuses. The
nasal septum is nearly midline. The orbits are symmetric.

IMPRESSION
Evidence of prior sinus surgery. No evidence of sinusitis.

Tech Notes:

PT WOKE UP WITH HEADACHE, NAUSEA, NOW CONFUSION, WEAKNESS.

## 2018-08-26 MED ORDER — LOSARTAN 50 MG PO TAB
100 mg | Freq: Every day | ORAL | 0 refills | Status: DC
Start: 2018-08-26 — End: 2018-08-31
  Administered 2018-08-27 – 2018-08-31 (×5): 100 mg via ORAL

## 2018-08-26 MED ORDER — SODIUM CHLORIDE 0.9 % IJ SOLN
50 mL | Freq: Once | INTRAVENOUS | 0 refills | Status: CP
Start: 2018-08-26 — End: ?
  Administered 2018-08-26: 22:00:00 50 mL via INTRAVENOUS

## 2018-08-26 MED ORDER — MAGNESIUM CHLORIDE 64 MG PO TBER
535 mg | ORAL | 0 refills | Status: DC | PRN
Start: 2018-08-26 — End: 2018-08-30

## 2018-08-26 MED ORDER — LEVOTHYROXINE 125 MCG PO TAB
250 ug | Freq: Every day | ORAL | 0 refills | Status: DC
Start: 2018-08-26 — End: 2018-08-31
  Administered 2018-08-27 – 2018-08-31 (×4): 250 ug via ORAL

## 2018-08-26 MED ORDER — GADOBENATE DIMEGLUMINE 529 MG/ML (0.1MMOL/0.2ML) IV SOLN
20 mL | Freq: Once | INTRAVENOUS | 0 refills | Status: CP
Start: 2018-08-26 — End: ?
  Administered 2018-08-26: 23:00:00 20 mL via INTRAVENOUS

## 2018-08-26 MED ORDER — LABETALOL 5 MG/ML IV SYRG
10-20 mg | INTRAVENOUS | 0 refills | Status: DC | PRN
Start: 2018-08-26 — End: 2018-08-29

## 2018-08-26 MED ORDER — DEXAMETHASONE 4 MG PO TAB
4 mg | ORAL | 0 refills | Status: DC
Start: 2018-08-26 — End: 2018-08-30
  Administered 2018-08-26 – 2018-08-30 (×12): 4 mg via ORAL

## 2018-08-26 MED ORDER — IOHEXOL 350 MG IODINE/ML IV SOLN
100 mL | Freq: Once | INTRAVENOUS | 0 refills | Status: CP
Start: 2018-08-26 — End: ?
  Administered 2018-08-26: 22:00:00 100 mL via INTRAVENOUS

## 2018-08-26 MED ORDER — POTASSIUM CHLORIDE IN WATER 10 MEQ/50 ML IV PGBK
10 meq | INTRAVENOUS | 0 refills | Status: DC | PRN
Start: 2018-08-26 — End: 2018-08-30

## 2018-08-26 MED ORDER — POTASSIUM CHLORIDE 20 MEQ/15 ML PO LIQD
40-60 meq | NASOGASTRIC | 0 refills | Status: DC | PRN
Start: 2018-08-26 — End: 2018-08-30

## 2018-08-26 MED ORDER — FENTANYL CITRATE (PF) 50 MCG/ML IJ SOLN
25-50 ug | INTRAVENOUS | 0 refills | Status: DC | PRN
Start: 2018-08-26 — End: 2018-08-28
  Administered 2018-08-26 – 2018-08-27 (×10): 50 ug via INTRAVENOUS

## 2018-08-26 MED ORDER — ONDANSETRON HCL (PF) 4 MG/2 ML IJ SOLN
4 mg | INTRAVENOUS | 0 refills | Status: DC | PRN
Start: 2018-08-26 — End: 2018-08-30
  Administered 2018-08-27: 05:00:00 4 mg via INTRAVENOUS

## 2018-08-26 MED ORDER — SENNOSIDES-DOCUSATE SODIUM 8.6-50 MG PO TAB
1 | Freq: Two times a day (BID) | ORAL | 0 refills | Status: DC
Start: 2018-08-26 — End: 2018-08-31
  Administered 2018-08-27 – 2018-08-31 (×9): 1 via ORAL

## 2018-08-26 MED ORDER — LORAZEPAM 2 MG/ML IJ SOLN
.5 mg | Freq: Once | INTRAVENOUS | 0 refills | Status: CP
Start: 2018-08-26 — End: ?
  Administered 2018-08-26: 22:00:00 1 mg via INTRAVENOUS

## 2018-08-26 MED ORDER — DULOXETINE 60 MG PO CPDR
60 mg | Freq: Every day | ORAL | 0 refills | Status: DC
Start: 2018-08-26 — End: 2018-08-31
  Administered 2018-08-27 – 2018-08-31 (×5): 60 mg via ORAL

## 2018-08-26 MED ORDER — HYDRALAZINE 20 MG/ML IJ SOLN
10 mg | INTRAVENOUS | 0 refills | Status: DC | PRN
Start: 2018-08-26 — End: 2018-08-29

## 2018-08-26 MED ORDER — POTASSIUM CHLORIDE 20 MEQ PO TBTQ
40-60 meq | ORAL | 0 refills | Status: DC | PRN
Start: 2018-08-26 — End: 2018-08-30
  Administered 2018-08-27 – 2018-08-28 (×2): 40 meq via ORAL

## 2018-08-26 MED ORDER — POTASSIUM, SODIUM PHOSPHATES 280-160-250 MG PO PWPK
1 | NASOGASTRIC | 0 refills | Status: DC | PRN
Start: 2018-08-26 — End: 2018-08-30

## 2018-08-26 MED ORDER — PANTOPRAZOLE 40 MG PO TBEC
40 mg | Freq: Every day | ORAL | 0 refills | Status: DC
Start: 2018-08-26 — End: 2018-08-31
  Administered 2018-08-27 – 2018-08-31 (×4): 40 mg via ORAL

## 2018-08-26 MED ORDER — POTASSIUM PHOSPHATE, MONOBASIC 500 MG PO TBSO
2 | ORAL | 0 refills | Status: DC | PRN
Start: 2018-08-26 — End: 2018-08-30

## 2018-08-26 MED ORDER — SODIUM PHOSPHATE IVPB
8 MMOL | INTRAVENOUS | 0 refills | Status: DC | PRN
Start: 2018-08-26 — End: 2018-08-30

## 2018-08-26 MED ORDER — ACETAMINOPHEN 325 MG PO TAB
650 mg | ORAL | 0 refills | Status: DC | PRN
Start: 2018-08-26 — End: 2018-08-28
  Administered 2018-08-27 (×2): 650 mg via ORAL

## 2018-08-26 MED ORDER — LORAZEPAM 0.5 MG PO TAB
.5 mg | Freq: Once | ORAL | 0 refills | Status: DC
Start: 2018-08-26 — End: 2018-08-26

## 2018-08-26 MED ORDER — BUPROPION XL 150 MG PO TB24
150 mg | Freq: Every day | ORAL | 0 refills | Status: DC
Start: 2018-08-26 — End: 2018-08-31
  Administered 2018-08-27 – 2018-08-31 (×5): 150 mg via ORAL

## 2018-08-26 MED ORDER — DEXTROAMPHETAMINE-AMPHETAMINE 10 MG PO TAB
30 mg | Freq: Two times a day (BID) | ORAL | 0 refills | Status: DC
Start: 2018-08-26 — End: 2018-08-31
  Administered 2018-08-27 – 2018-08-31 (×9): 30 mg via ORAL

## 2018-08-26 MED ORDER — TRAMADOL 50 MG PO TAB
50 mg | ORAL | 0 refills | Status: DC | PRN
Start: 2018-08-26 — End: 2018-08-31
  Administered 2018-08-26 – 2018-08-31 (×4): 50 mg via ORAL

## 2018-08-26 MED ORDER — MAGNESIUM HYDROXIDE 2,400 MG/10 ML PO SUSP
10 mL | Freq: Every day | ORAL | 0 refills | Status: DC
Start: 2018-08-26 — End: 2018-08-31
  Administered 2018-08-29 – 2018-08-31 (×2): 10 mL via ORAL

## 2018-08-26 MED ORDER — HYDROCODONE-ACETAMINOPHEN 5-325 MG PO TAB
1-2 | ORAL | 0 refills | Status: DC | PRN
Start: 2018-08-26 — End: 2018-08-26

## 2018-08-26 MED ORDER — MAGNESIUM SULFATE IN D5W 1 GRAM/100 ML IV PGBK
1 g | INTRAVENOUS | 0 refills | Status: DC | PRN
Start: 2018-08-26 — End: 2018-08-30

## 2018-08-26 MED ORDER — BISACODYL 10 MG RE SUPP
10 mg | Freq: Every day | RECTAL | 0 refills | Status: DC
Start: 2018-08-26 — End: 2018-08-31

## 2018-08-26 MED ORDER — LEVETIRACETAM 500 MG PO TAB
500 mg | Freq: Two times a day (BID) | ORAL | 0 refills | Status: DC
Start: 2018-08-26 — End: 2018-08-28
  Administered 2018-08-26 – 2018-08-27 (×3): 500 mg via ORAL

## 2018-08-26 MED ORDER — DOCUSATE SODIUM 100 MG PO CAP
100 mg | Freq: Two times a day (BID) | ORAL | 0 refills | Status: DC
Start: 2018-08-26 — End: 2018-08-28
  Administered 2018-08-27 (×2): 100 mg via ORAL

## 2018-08-27 ENCOUNTER — Encounter: Admit: 2018-08-27 | Discharge: 2018-08-27 | Payer: Commercial Managed Care - PPO

## 2018-08-27 DIAGNOSIS — C713 Malignant neoplasm of parietal lobe: ICD-10-CM

## 2018-08-27 LAB — BASIC METABOLIC PANEL: Lab: 141 MMOL/L (ref 137–147)

## 2018-08-27 LAB — CBC AND DIFF: Lab: 7.1 10*3/uL — ABNORMAL LOW (ref >60)

## 2018-08-27 MED ORDER — LIDOCAINE-EPINEPHRINE 1 %-1:100,000 IJ SOLN
0 refills | Status: DC
Start: 2018-08-27 — End: 2018-08-28
  Administered 2018-08-27: 23:00:00 10 mL via INTRAMUSCULAR

## 2018-08-27 MED ORDER — THROMBIN (BOVINE) 5,000 UNIT TP SOLR
0 refills | Status: DC
Start: 2018-08-27 — End: 2018-08-28
  Administered 2018-08-27: 23:00:00 5000 [IU] via TOPICAL

## 2018-08-27 MED ORDER — BACITRACIN 50,000 UN LR 500 ML IRR BOT (OR)
0 refills | Status: DC
Start: 2018-08-27 — End: 2018-08-28
  Administered 2018-08-27 (×2): 500 mL

## 2018-08-27 MED ORDER — DEXTRAN 70-HYPROMELLOSE (PF) 0.1-0.3 % OP DPET
0 refills | Status: DC
Start: 2018-08-27 — End: 2018-08-28
  Administered 2018-08-27: 22:00:00 2 [drp] via OPHTHALMIC

## 2018-08-27 MED ORDER — FLUORESCEIN 500 MG/5 ML (10 %) IV SOLN
0 refills | Status: DC
Start: 2018-08-27 — End: 2018-08-28
  Administered 2018-08-27: 22:00:00 300 mg via INTRAVENOUS

## 2018-08-27 MED ORDER — ALPRAZOLAM 0.25 MG PO TAB
0.25 mg | Freq: Once | ORAL | 0 refills | Status: CP
Start: 2018-08-27 — End: ?
  Administered 2018-08-27: 16:00:00 0.25 mg via ORAL

## 2018-08-27 MED ORDER — HYDRALAZINE 20 MG/ML IJ SOLN
10 mg | INTRAVENOUS | 0 refills | Status: DC | PRN
Start: 2018-08-27 — End: 2018-08-29

## 2018-08-27 MED ORDER — PROPOFOL 10 MG/ML IV EMUL (INFUSION)(AM)(OR)
0 refills | Status: DC
Start: 2018-08-27 — End: 2018-08-28
  Administered 2018-08-27: 22:00:00 150 ug/kg/min via INTRAVENOUS
  Administered 2018-08-27: 23:00:00 125 ug/kg/min via INTRAVENOUS
  Administered 2018-08-28: 100.000 mL via INTRAVENOUS

## 2018-08-27 MED ORDER — LABETALOL 5 MG/ML IV SYRG
10-20 mg | INTRAVENOUS | 0 refills | Status: DC | PRN
Start: 2018-08-27 — End: 2018-08-29

## 2018-08-27 MED ORDER — ROCURONIUM 10 MG/ML IV SOLN
INTRAVENOUS | 0 refills | Status: DC
Start: 2018-08-27 — End: 2018-08-28
  Administered 2018-08-27: 22:00:00 50 mg via INTRAVENOUS
  Administered 2018-08-27: 23:00:00 20 mg via INTRAVENOUS
  Administered 2018-08-28: 02:00:00 10 mg via INTRAVENOUS

## 2018-08-27 MED ORDER — FENTANYL CITRATE (PF) 50 MCG/ML IJ SOLN
0 refills | Status: DC
Start: 2018-08-27 — End: 2018-08-28
  Administered 2018-08-27 – 2018-08-28 (×4): 50 ug via INTRAVENOUS

## 2018-08-27 MED ORDER — OXYCODONE 5 MG PO TAB
5-15 mg | ORAL | 0 refills | Status: DC | PRN
Start: 2018-08-27 — End: 2018-08-31
  Administered 2018-08-29: 23:00:00 10 mg via ORAL

## 2018-08-27 MED ORDER — DEXAMETHASONE SODIUM PHOS (PF) 10 MG/ML IJ SOLN
0 refills | Status: DC
Start: 2018-08-27 — End: 2018-08-28
  Administered 2018-08-27: 22:00:00 10 mg

## 2018-08-27 MED ORDER — ELECTROLYTE-A IV SOLP
0 refills | Status: DC
Start: 2018-08-27 — End: 2018-08-28
  Administered 2018-08-27 – 2018-08-28 (×2): via INTRAVENOUS

## 2018-08-27 MED ORDER — LEVETIRACETAM IVPB
0 refills | Status: DC
Start: 2018-08-27 — End: 2018-08-28
  Administered 2018-08-28 (×2): 500 mg via INTRAVENOUS

## 2018-08-27 MED ORDER — MIDAZOLAM 1 MG/ML IJ SOLN
0 refills | Status: DC
Start: 2018-08-27 — End: 2018-08-28
  Administered 2018-08-27: 22:00:00 2 mg via INTRAVENOUS

## 2018-08-27 MED ORDER — FENTANYL CITRATE (PF) 50 MCG/ML IJ SOLN
25-50 ug | INTRAVENOUS | 0 refills | Status: DC | PRN
Start: 2018-08-27 — End: 2018-08-29
  Administered 2018-08-28 – 2018-08-29 (×7): 50 ug via INTRAVENOUS

## 2018-08-27 MED ORDER — ONDANSETRON HCL (PF) 4 MG/2 ML IJ SOLN
4 mg | INTRAVENOUS | 0 refills | Status: DC | PRN
Start: 2018-08-27 — End: 2018-08-31
  Administered 2018-08-28: 23:00:00 4 mg via INTRAVENOUS

## 2018-08-27 MED ORDER — DEXAMETHASONE SODIUM PHOSPHATE 4 MG/ML IJ SOLN
4 mg | INTRAVENOUS | 0 refills | Status: DC
Start: 2018-08-27 — End: 2018-08-28
  Administered 2018-08-28 (×2): 4 mg via INTRAVENOUS

## 2018-08-27 MED ORDER — LIDOCAINE (PF) 200 MG/10 ML (2 %) IJ SYRG
0 refills | Status: DC
Start: 2018-08-27 — End: 2018-08-28
  Administered 2018-08-27: 22:00:00 60 mg via INTRAVENOUS

## 2018-08-27 MED ORDER — MANNITOL 20 % 20 % IV SOLP
0 refills | Status: DC
Start: 2018-08-27 — End: 2018-08-28
  Administered 2018-08-27: 23:00:00 50 g via INTRAVENOUS
  Administered 2018-08-27: 20 g via INTRAVENOUS

## 2018-08-27 MED ORDER — CEFAZOLIN INJ 1GM IVP
2 g | INTRAVENOUS | 0 refills | Status: CP
Start: 2018-08-27 — End: ?
  Administered 2018-08-28 (×3): 2 g via INTRAVENOUS

## 2018-08-27 MED ORDER — CEFAZOLIN 1 GRAM IJ SOLR
0 refills | Status: DC
Start: 2018-08-27 — End: 2018-08-28
  Administered 2018-08-27: 23:00:00 3 g via INTRAVENOUS

## 2018-08-27 MED ORDER — ACETAMINOPHEN 325 MG PO TAB
650 mg | ORAL | 0 refills | Status: DC
Start: 2018-08-27 — End: 2018-08-31
  Administered 2018-08-28 – 2018-08-31 (×13): 650 mg via ORAL

## 2018-08-27 MED ORDER — PROPOFOL 10 MG/ML IV EMUL
5-150 ug/kg/min | INTRAVENOUS | 0 refills | Status: DC
Start: 2018-08-27 — End: 2018-08-28
  Administered 2018-08-28: 08:00:00 50 ug/kg/min via INTRAVENOUS
  Administered 2018-08-28: 11:00:00 10 ug/kg/min via INTRAVENOUS

## 2018-08-27 MED ORDER — LACTATED RINGERS IV SOLP
0 refills | Status: DC
Start: 2018-08-27 — End: 2018-08-28
  Administered 2018-08-27: 22:00:00 via INTRAVENOUS

## 2018-08-27 MED ORDER — BACITRACIN ZINC 500 UNIT/GRAM TP OINT
0 refills | Status: DC
Start: 2018-08-27 — End: 2018-08-28
  Administered 2018-08-27: 23:00:00 1 via TOPICAL

## 2018-08-27 MED ORDER — LEVETIRACETAM IN NACL (ISO-OS) 500 MG/100 ML IV PGBK
500 mg | Freq: Two times a day (BID) | INTRAVENOUS | 0 refills | Status: DC
Start: 2018-08-27 — End: 2018-08-28
  Administered 2018-08-28: 05:00:00 500 mg via INTRAVENOUS

## 2018-08-27 MED ORDER — LEVETIRACETAM 500 MG PO TAB
500 mg | Freq: Two times a day (BID) | ORAL | 0 refills | Status: DC
Start: 2018-08-27 — End: 2018-08-31
  Administered 2018-08-28 – 2018-08-31 (×7): 500 mg via ORAL

## 2018-08-27 MED ORDER — NICARDIPINE IN NACL (ISO-OS) 40 MG/200 ML IV PGBK (INFUSION)(AM)(OR)
0 refills | Status: DC
Start: 2018-08-27 — End: 2018-08-28
  Administered 2018-08-28: 5 mg/h via INTRAVENOUS

## 2018-08-27 MED ORDER — REMIFENTANYL 1000MCG IN NS 20ML (OR)
0 refills | Status: DC
Start: 2018-08-27 — End: 2018-08-28
  Administered 2018-08-27 (×2): .1 ug/kg/min via INTRAVENOUS
  Administered 2018-08-28 (×2): 20.000 mL via INTRAVENOUS

## 2018-08-27 MED ORDER — PROPOFOL INJ 10 MG/ML IV VIAL
0 refills | Status: DC
Start: 2018-08-27 — End: 2018-08-28
  Administered 2018-08-27: 22:00:00 130 mg via INTRAVENOUS
  Administered 2018-08-27: 22:00:00 70 mg via INTRAVENOUS

## 2018-08-27 MED ORDER — ONDANSETRON HCL (PF) 4 MG/2 ML IJ SOLN
INTRAVENOUS | 0 refills | Status: DC
Start: 2018-08-27 — End: 2018-08-28
  Administered 2018-08-28: 02:00:00 4 mg via INTRAVENOUS

## 2018-08-27 MED ORDER — ESMOLOL 100 MG/10 ML (10 MG/ML) IV SOLN
0 refills | Status: DC
Start: 2018-08-27 — End: 2018-08-28
  Administered 2018-08-28: 03:00:00 30 mg via INTRAVENOUS
  Administered 2018-08-28: 03:00:00 50 mg via INTRAVENOUS

## 2018-08-27 MED ORDER — SODIUM CHLORIDE 0.9 % IV SOLP
1000 mL | INTRAVENOUS | 0 refills | Status: DC
Start: 2018-08-27 — End: 2018-08-28
  Administered 2018-08-27 – 2018-08-28 (×2): 1000 mL via INTRAVENOUS

## 2018-08-27 MED ORDER — PHENYLEPHRINE IN 0.9% NACL(PF) 1 MG/10 ML (100 MCG/ML) IV SYRG
INTRAVENOUS | 0 refills | Status: DC
Start: 2018-08-27 — End: 2018-08-28
  Administered 2018-08-27 (×2): 50 ug via INTRAVENOUS

## 2018-08-27 MED ORDER — DOCUSATE SODIUM 100 MG PO CAP
100 mg | Freq: Two times a day (BID) | ORAL | 0 refills | Status: DC
Start: 2018-08-27 — End: 2018-08-31
  Administered 2018-08-28 – 2018-08-31 (×7): 100 mg via ORAL

## 2018-08-28 ENCOUNTER — Encounter: Admit: 2018-08-28 | Discharge: 2018-08-28 | Payer: Commercial Managed Care - PPO

## 2018-08-28 ENCOUNTER — Inpatient Hospital Stay: Admit: 2018-08-28 | Discharge: 2018-08-28 | Payer: Commercial Managed Care - PPO

## 2018-08-28 LAB — MAGNESIUM: Lab: 1.7 mg/dL (ref 1.6–2.6)

## 2018-08-28 LAB — BLOOD GASES, ARTERIAL
Lab: 0.8 MMOL/L
Lab: 25 MMOL/L (ref 21–28)
Lab: 270 mmHg — ABNORMAL HIGH (ref 80–100)
Lab: 45 mmHg (ref 35–45)
Lab: 99 % — ABNORMAL HIGH (ref 95–99)
Lab: 7.3 (ref 7.35–7.45)

## 2018-08-28 LAB — POC BLOOD GAS ARTERIAL
Lab: 37 mmHg (ref 35–45)
Lab: 7.4 (ref 7.35–7.45)
Lab: 99 % (ref 95–99)

## 2018-08-28 LAB — POC POTASSIUM: Lab: 3.9 MMOL/L (ref 3.5–5.1)

## 2018-08-28 LAB — POC IONIZED CALCIUM: Lab: 1.1 MMOL/L (ref 1.0–1.3)

## 2018-08-28 LAB — POC HEMATOCRIT
Lab: 11 g/dL — ABNORMAL LOW (ref 12.0–15.0)
Lab: 35 % — ABNORMAL LOW (ref 36–45)

## 2018-08-28 LAB — POC GLUCOSE
Lab: 123 mg/dL — ABNORMAL HIGH (ref 70–100)
Lab: 124 mg/dL — ABNORMAL HIGH (ref 70–100)

## 2018-08-28 LAB — POC SODIUM: Lab: 136 MMOL/L — ABNORMAL LOW (ref 137–147)

## 2018-08-28 LAB — BASIC METABOLIC PANEL: Lab: 143 MMOL/L — ABNORMAL LOW (ref >60)

## 2018-08-28 LAB — CBC AND DIFF: Lab: 14 K/UL — ABNORMAL HIGH (ref >60)

## 2018-08-28 MED ORDER — METHOCARBAMOL 750 MG PO TAB
750 mg | ORAL | 0 refills | Status: DC | PRN
Start: 2018-08-28 — End: 2018-08-31
  Administered 2018-08-29 – 2018-08-30 (×2): 750 mg via ORAL

## 2018-08-28 MED ORDER — POTASSIUM CHLORIDE IN WATER 10 MEQ/50 ML IV PGBK
10 meq | INTRAVENOUS | 0 refills | Status: AC
Start: 2018-08-28 — End: ?
  Administered 2018-08-28 (×2): 10 meq via INTRAVENOUS

## 2018-08-28 MED ORDER — GADOBENATE DIMEGLUMINE 529 MG/ML (0.1MMOL/0.2ML) IV SOLN
20 mL | Freq: Once | INTRAVENOUS | 0 refills | Status: CP
Start: 2018-08-28 — End: ?
  Administered 2018-08-28: 06:00:00 20 mL via INTRAVENOUS

## 2018-08-28 MED ADMIN — PROPOFOL 10 MG/ML IV EMUL [11150]: 75 ug/kg/min | INTRAVENOUS | @ 04:00:00 | Stop: 2018-08-28 | NDC 63323026965

## 2018-08-29 LAB — CBC AND DIFF: Lab: 13 K/UL — ABNORMAL HIGH (ref 4.5–11.0)

## 2018-08-29 LAB — BASIC METABOLIC PANEL: Lab: 137 MMOL/L — ABNORMAL LOW (ref 137–147)

## 2018-08-29 MED ORDER — HEPARIN, PORCINE (PF) 5,000 UNIT/0.5 ML IJ SYRG
5000 [IU] | SUBCUTANEOUS | 0 refills | Status: DC
Start: 2018-08-29 — End: 2018-08-31
  Administered 2018-08-30 – 2018-08-31 (×4): 5000 [IU] via SUBCUTANEOUS

## 2018-08-30 ENCOUNTER — Ambulatory Visit: Admit: 2018-08-30 | Discharge: 2018-08-30 | Payer: Commercial Managed Care - PPO

## 2018-08-30 ENCOUNTER — Encounter: Admit: 2018-08-30 | Discharge: 2018-08-30 | Payer: Commercial Managed Care - PPO

## 2018-08-30 LAB — CBC AND DIFF: Lab: 8.6 10*3/uL (ref 4.5–11.0)

## 2018-08-30 LAB — BASIC METABOLIC PANEL
Lab: 140 MMOL/L — ABNORMAL LOW (ref 137–147)
Lab: 3.9 MMOL/L — ABNORMAL LOW (ref 3.5–5.1)

## 2018-08-30 MED ORDER — DEXAMETHASONE 4 MG PO TAB
4 mg | ORAL | 0 refills | Status: DC
Start: 2018-08-30 — End: 2018-08-31
  Administered 2018-08-30 – 2018-08-31 (×4): 4 mg via ORAL

## 2018-08-31 ENCOUNTER — Inpatient Hospital Stay: Admit: 2018-08-26 | Discharge: 2018-08-26 | Payer: Commercial Managed Care - PPO

## 2018-08-31 ENCOUNTER — Encounter: Admit: 2018-08-31 | Discharge: 2018-08-31 | Payer: Commercial Managed Care - PPO

## 2018-08-31 ENCOUNTER — Inpatient Hospital Stay: Admit: 2018-08-27 | Discharge: 2018-08-27 | Payer: Commercial Managed Care - PPO

## 2018-08-31 ENCOUNTER — Inpatient Hospital Stay
Admit: 2018-08-26 | Discharge: 2018-08-31 | Disposition: A | Discharge status: Rehabilitation Facility | Payer: Commercial Managed Care - PPO | Source: Other Acute Inpatient Hospital

## 2018-08-31 DIAGNOSIS — F17210 Nicotine dependence, cigarettes, uncomplicated: ICD-10-CM

## 2018-08-31 DIAGNOSIS — Z006 Encounter for examination for normal comparison and control in clinical research program: ICD-10-CM

## 2018-08-31 DIAGNOSIS — Z7409 Other reduced mobility: ICD-10-CM

## 2018-08-31 DIAGNOSIS — Z833 Family history of diabetes mellitus: ICD-10-CM

## 2018-08-31 DIAGNOSIS — H53462 Homonymous bilateral field defects, left side: ICD-10-CM

## 2018-08-31 DIAGNOSIS — Z90710 Acquired absence of both cervix and uterus: ICD-10-CM

## 2018-08-31 DIAGNOSIS — I612 Nontraumatic intracerebral hemorrhage in hemisphere, unspecified: ICD-10-CM

## 2018-08-31 DIAGNOSIS — F329 Major depressive disorder, single episode, unspecified: ICD-10-CM

## 2018-08-31 DIAGNOSIS — E039 Hypothyroidism, unspecified: ICD-10-CM

## 2018-08-31 DIAGNOSIS — E785 Hyperlipidemia, unspecified: ICD-10-CM

## 2018-08-31 DIAGNOSIS — G936 Cerebral edema: ICD-10-CM

## 2018-08-31 DIAGNOSIS — Z9049 Acquired absence of other specified parts of digestive tract: ICD-10-CM

## 2018-08-31 DIAGNOSIS — C715 Malignant neoplasm of cerebral ventricle: Principal | ICD-10-CM

## 2018-08-31 DIAGNOSIS — G935 Compression of brain: ICD-10-CM

## 2018-08-31 DIAGNOSIS — D72829 Elevated white blood cell count, unspecified: ICD-10-CM

## 2018-08-31 DIAGNOSIS — Z79899 Other long term (current) drug therapy: ICD-10-CM

## 2018-08-31 DIAGNOSIS — I10 Essential (primary) hypertension: Principal | ICD-10-CM

## 2018-08-31 DIAGNOSIS — R5383 Other fatigue: ICD-10-CM

## 2018-08-31 DIAGNOSIS — Z79890 Hormone replacement therapy (postmenopausal): ICD-10-CM

## 2018-08-31 DIAGNOSIS — Z8249 Family history of ischemic heart disease and other diseases of the circulatory system: ICD-10-CM

## 2018-08-31 DIAGNOSIS — F909 Attention-deficit hyperactivity disorder, unspecified type: ICD-10-CM

## 2018-08-31 DIAGNOSIS — G43909 Migraine, unspecified, not intractable, without status migrainosus: ICD-10-CM

## 2018-08-31 DIAGNOSIS — R410 Disorientation, unspecified: ICD-10-CM

## 2018-08-31 DIAGNOSIS — F419 Anxiety disorder, unspecified: ICD-10-CM

## 2018-08-31 DIAGNOSIS — C713 Malignant neoplasm of parietal lobe: Secondary | ICD-10-CM

## 2018-08-31 DIAGNOSIS — E079 Disorder of thyroid, unspecified: ICD-10-CM

## 2018-08-31 LAB — BASIC METABOLIC PANEL: Lab: 139 MMOL/L — ABNORMAL LOW (ref 137–147)

## 2018-08-31 LAB — CBC AND DIFF: Lab: 7.2 K/UL — ABNORMAL HIGH (ref 4.5–11.0)

## 2018-08-31 MED ORDER — BUPROPION XL 150 MG PO TB24
150 mg | Freq: Every day | ORAL | 0 refills | Status: DC
Start: 2018-08-31 — End: 2018-09-10
  Administered 2018-09-01 – 2018-09-10 (×10): 150 mg via ORAL

## 2018-08-31 MED ORDER — PANTOPRAZOLE 40 MG PO TBEC
40 mg | Freq: Every day | ORAL | 0 refills | Status: DC
Start: 2018-08-31 — End: 2018-09-10
  Administered 2018-09-01 – 2018-09-10 (×10): 40 mg via ORAL

## 2018-08-31 MED ORDER — LOSARTAN 50 MG PO TAB
100 mg | Freq: Every day | ORAL | 0 refills | Status: DC
Start: 2018-08-31 — End: 2018-09-10
  Administered 2018-09-01 – 2018-09-10 (×10): 100 mg via ORAL

## 2018-08-31 MED ORDER — DEXTROAMPHETAMINE-AMPHETAMINE 10 MG PO TAB
30 mg | Freq: Two times a day (BID) | ORAL | 0 refills | Status: DC
Start: 2018-08-31 — End: 2018-09-10
  Administered 2018-09-01 – 2018-09-10 (×20): 30 mg via ORAL

## 2018-08-31 MED ORDER — OXYCODONE 5 MG PO TAB
5-15 mg | ORAL | 0 refills | 6.00000 days | Status: AC | PRN
Start: 2018-08-31 — End: 2018-09-10

## 2018-08-31 MED ORDER — BISACODYL 10 MG RE SUPP
10 mg | Freq: Every day | RECTAL | 0 refills | Status: DC | PRN
Start: 2018-08-31 — End: 2018-09-10

## 2018-08-31 MED ORDER — ONDANSETRON HCL 4 MG PO TAB
4 mg | ORAL | 0 refills | Status: DC | PRN
Start: 2018-08-31 — End: 2018-09-10
  Administered 2018-09-08: 17:00:00 4 mg via ORAL

## 2018-08-31 MED ORDER — DEXAMETHASONE 4 MG PO TAB
4 mg | Freq: Two times a day (BID) | ORAL | 0 refills | Status: CP
Start: 2018-08-31 — End: ?
  Administered 2018-09-02 – 2018-09-04 (×4): 4 mg via ORAL

## 2018-08-31 MED ORDER — ACETAMINOPHEN 325 MG PO TAB
650 mg | ORAL | 0 refills | Status: DC
Start: 2018-08-31 — End: 2018-09-10
  Administered 2018-09-01 – 2018-09-10 (×38): 650 mg via ORAL

## 2018-08-31 MED ORDER — DOCUSATE SODIUM 100 MG PO CAP
100 mg | Freq: Two times a day (BID) | ORAL | 0 refills | Status: DC
Start: 2018-08-31 — End: 2018-09-10
  Administered 2018-09-01 – 2018-09-10 (×19): 100 mg via ORAL

## 2018-08-31 MED ORDER — DEXAMETHASONE 2 MG PO TAB
2 mg | Freq: Two times a day (BID) | ORAL | 0 refills | Status: CP
Start: 2018-08-31 — End: ?
  Administered 2018-09-04 – 2018-09-06 (×4): 2 mg via ORAL

## 2018-08-31 MED ORDER — IMS MIXTURE TEMPLATE
250 ug | Freq: Every day | ORAL | 0 refills | Status: DC
Start: 2018-08-31 — End: 2018-09-10
  Administered 2018-09-01 – 2018-09-10 (×20): 250 ug via ORAL

## 2018-08-31 MED ORDER — ACETAMINOPHEN 325 MG PO TAB
650 mg | ORAL | 0 refills | Status: AC | PRN
Start: 2018-08-31 — End: 2018-09-10

## 2018-08-31 MED ORDER — DEXAMETHASONE 4 MG PO TAB
4 mg | ORAL | 0 refills | Status: AC
Start: 2018-08-31 — End: ?
  Administered 2018-09-01 – 2018-09-02 (×5): 4 mg via ORAL

## 2018-08-31 MED ORDER — SODIUM CHLORIDE 0.9 % FLUSH
3-5 mL | Freq: Three times a day (TID) | 0 refills | Status: DC
Start: 2018-08-31 — End: 2018-09-10

## 2018-08-31 MED ORDER — METHOCARBAMOL 500 MG PO TAB
750 mg | ORAL | 0 refills | Status: DC | PRN
Start: 2018-08-31 — End: 2018-09-03

## 2018-08-31 MED ORDER — LEVETIRACETAM 500 MG PO TAB
500 mg | Freq: Two times a day (BID) | ORAL | 0 refills | Status: DC
Start: 2018-08-31 — End: 2018-09-10
  Administered 2018-09-01 – 2018-09-10 (×20): 500 mg via ORAL

## 2018-08-31 MED ORDER — ALUMINUM-MAGNESIUM HYDROXIDE 200-200 MG/5 ML PO SUSP
30 mL | ORAL | 0 refills | Status: DC | PRN
Start: 2018-08-31 — End: 2018-09-10

## 2018-08-31 MED ORDER — DEXAMETHASONE 2 MG PO TAB
2 mg | Freq: Every day | ORAL | 0 refills | Status: DC
Start: 2018-08-31 — End: 2018-09-06
  Administered 2018-09-06: 15:00:00 2 mg via ORAL

## 2018-08-31 MED ORDER — LEVETIRACETAM 500 MG PO TAB
500 mg | ORAL_TABLET | Freq: Two times a day (BID) | ORAL | 0 refills | Status: SS
Start: 2018-08-31 — End: 2018-09-09

## 2018-08-31 MED ORDER — DEXAMETHASONE 2 MG PO TAB
Freq: Three times a day (TID) | INTRAMUSCULAR | 0 refills | 14.00000 days | Status: AC
Start: 2018-08-31 — End: 2018-09-10

## 2018-08-31 MED ORDER — METHOCARBAMOL 750 MG PO TAB
750 mg | ORAL_TABLET | ORAL | 0 refills | Status: AC | PRN
Start: 2018-08-31 — End: 2018-09-10

## 2018-08-31 MED ORDER — HEPARIN, PORCINE (PF) 5,000 UNIT/0.5 ML IJ SYRG
5000 [IU] | SUBCUTANEOUS | 0 refills | Status: AC
Start: 2018-08-31 — End: 2018-09-10

## 2018-08-31 MED ORDER — SENNOSIDES-DOCUSATE SODIUM 8.6-50 MG PO TAB
1 | Freq: Two times a day (BID) | ORAL | 0 refills | Status: AC
Start: 2018-08-31 — End: 2018-09-10

## 2018-08-31 MED ORDER — TRAMADOL 50 MG PO TAB
50 mg | ORAL | 0 refills | Status: DC | PRN
Start: 2018-08-31 — End: 2018-09-10
  Administered 2018-09-02 – 2018-09-09 (×11): 50 mg via ORAL

## 2018-08-31 MED ORDER — INSULIN ASPART 100 UNIT/ML SC FLEXPEN
0-6 [IU] | Freq: Before meals | SUBCUTANEOUS | 0 refills | Status: DC
Start: 2018-08-31 — End: 2018-09-03

## 2018-08-31 MED ORDER — HEPARIN, PORCINE (PF) 5,000 UNIT/0.5 ML IJ SYRG
5000 [IU] | SUBCUTANEOUS | 0 refills | Status: DC
Start: 2018-08-31 — End: 2018-09-10
  Administered 2018-09-01 – 2018-09-10 (×27): 5000 [IU] via SUBCUTANEOUS

## 2018-08-31 MED ORDER — MAGNESIUM HYDROXIDE 2,400 MG/10 ML PO SUSP
10 mL | ORAL | 0 refills | Status: DC | PRN
Start: 2018-08-31 — End: 2018-09-10

## 2018-08-31 MED ORDER — DULOXETINE 60 MG PO CPDR
60 mg | Freq: Every day | ORAL | 0 refills | Status: DC
Start: 2018-08-31 — End: 2018-09-10
  Administered 2018-09-01 – 2018-09-10 (×9): 60 mg via ORAL

## 2018-08-31 MED ORDER — SENNOSIDES 8.6 MG PO TAB
2 | Freq: Every evening | ORAL | 0 refills | Status: DC
Start: 2018-08-31 — End: 2018-09-10
  Administered 2018-09-01 – 2018-09-10 (×9): 2 via ORAL

## 2018-09-01 LAB — POC GLUCOSE
Lab: 154 mg/dL — ABNORMAL HIGH (ref 70–100)
Lab: 127 mg/dL — ABNORMAL HIGH (ref 70–100)
Lab: 121 mg/dL — ABNORMAL HIGH (ref 70–100)
Lab: 142 mg/dL — ABNORMAL HIGH (ref 70–100)
Lab: 131 mg/dL — ABNORMAL HIGH (ref 70–100)

## 2018-09-01 LAB — URINALYSIS DIPSTICK REFLEX TO CULTURE
Lab: NEGATIVE
Lab: NEGATIVE

## 2018-09-01 LAB — URINALYSIS MICROSCOPIC REFLEX TO CULTURE

## 2018-09-01 LAB — CBC CELLULAR THERAPEUTICS: Lab: 9.2 K/UL — ABNORMAL LOW (ref 4.5–11.0)

## 2018-09-01 LAB — BASIC METABOLIC PANEL CELLULAR THERAPEUTICS: Lab: 139 MMOL/L — ABNORMAL LOW (ref >60)

## 2018-09-01 MED ORDER — POLYETHYLENE GLYCOL 3350 17 GRAM PO PWPK
1 | Freq: Every day | ORAL | 0 refills | Status: DC
Start: 2018-09-01 — End: 2018-09-10
  Administered 2018-09-01 – 2018-09-10 (×9): 17 g via ORAL

## 2018-09-01 MED ORDER — TAMSULOSIN 0.4 MG PO CAP
0.4 mg | Freq: Every day | ORAL | 0 refills | Status: DC
Start: 2018-09-01 — End: 2018-09-10
  Administered 2018-09-01 – 2018-09-10 (×10): 0.4 mg via ORAL

## 2018-09-02 LAB — POC GLUCOSE
Lab: 134 mg/dL — ABNORMAL HIGH (ref 70–100)
Lab: 101 mg/dL — ABNORMAL HIGH (ref 70–100)
Lab: 109 mg/dL — ABNORMAL HIGH (ref 70–100)
Lab: 105 mg/dL — ABNORMAL HIGH (ref 70–100)

## 2018-09-02 LAB — CBC CELLULAR THERAPEUTICS: Lab: 9.1 K/UL — ABNORMAL LOW (ref 4.5–11.0)

## 2018-09-02 LAB — BASIC METABOLIC PANEL CELLULAR THERAPEUTICS: Lab: 139 MMOL/L — ABNORMAL LOW (ref >60)

## 2018-09-02 MED ORDER — FUROSEMIDE 40 MG PO TAB
40 mg | Freq: Every day | ORAL | 0 refills | Status: DC | PRN
Start: 2018-09-02 — End: 2018-09-10

## 2018-09-02 MED ORDER — ASPIRIN 81 MG PO CHEW
81 mg | Freq: Every day | ORAL | 0 refills | Status: DC
Start: 2018-09-02 — End: 2018-09-10
  Administered 2018-09-02 – 2018-09-10 (×9): 81 mg via ORAL

## 2018-09-03 LAB — POC GLUCOSE
Lab: 119 mg/dL — ABNORMAL HIGH (ref 70–100)
Lab: 90 mg/dL (ref 70–100)

## 2018-09-03 LAB — CBC CELLULAR THERAPEUTICS: Lab: 11 K/UL — ABNORMAL HIGH (ref 4.5–11.0)

## 2018-09-03 LAB — BASIC METABOLIC PANEL CELLULAR THERAPEUTICS: Lab: 140 MMOL/L — ABNORMAL LOW (ref 137–147)

## 2018-09-03 MED ORDER — HYDROCHLOROTHIAZIDE 12.5 MG PO TAB
12.5 mg | Freq: Every day | ORAL | 0 refills | Status: DC
Start: 2018-09-03 — End: 2018-09-04
  Administered 2018-09-03 – 2018-09-04 (×2): 12.5 mg via ORAL

## 2018-09-04 LAB — CBC CELLULAR THERAPEUTICS: Lab: 12 K/UL — ABNORMAL HIGH (ref >60)

## 2018-09-04 LAB — BASIC METABOLIC PANEL CELLULAR THERAPEUTICS: Lab: 136 MMOL/L — ABNORMAL LOW (ref >60)

## 2018-09-04 MED ORDER — PNEUMOCOCCAL 23-VAL PS VACCINE 25 MCG/0.5 ML IJ SOLN
.5 mL | Freq: Once | INTRAMUSCULAR | 0 refills | Status: DC
Start: 2018-09-04 — End: 2018-09-09

## 2018-09-04 MED ORDER — FLU VACC QS2019-20 6MOS UP(PF) 60 MCG (15 MCG X 4)/0.5 ML IM SYRG
.5 mL | Freq: Once | INTRAMUSCULAR | 0 refills | Status: CP
Start: 2018-09-04 — End: ?
  Administered 2018-09-04: 21:00:00 0.5 mL via INTRAMUSCULAR

## 2018-09-04 MED ORDER — HYDROCHLOROTHIAZIDE 25 MG PO TAB
25 mg | Freq: Every day | ORAL | 0 refills | Status: DC
Start: 2018-09-04 — End: 2018-09-10
  Administered 2018-09-05 – 2018-09-10 (×6): 25 mg via ORAL

## 2018-09-05 LAB — CBC CELLULAR THERAPEUTICS: Lab: 10 K/UL — ABNORMAL LOW (ref 4.5–11.0)

## 2018-09-05 LAB — BASIC METABOLIC PANEL CELLULAR THERAPEUTICS: Lab: 139 MMOL/L — ABNORMAL LOW (ref >60)

## 2018-09-06 LAB — CBC CELLULAR THERAPEUTICS: Lab: 9.7 K/UL — ABNORMAL LOW (ref 4.5–11.0)

## 2018-09-06 LAB — BASIC METABOLIC PANEL CELLULAR THERAPEUTICS: Lab: 138 MMOL/L — ABNORMAL LOW (ref 137–147)

## 2018-09-06 MED ORDER — DEXAMETHASONE 2 MG PO TAB
2 mg | Freq: Every day | ORAL | 0 refills | Status: DC
Start: 2018-09-06 — End: 2018-09-10
  Administered 2018-09-07 – 2018-09-10 (×4): 2 mg via ORAL

## 2018-09-07 MED ORDER — MELATONIN 5 MG PO TAB
5 mg | Freq: Every evening | ORAL | 0 refills | Status: DC
Start: 2018-09-07 — End: 2018-09-10
  Administered 2018-09-08 – 2018-09-10 (×3): 5 mg via ORAL

## 2018-09-08 ENCOUNTER — Encounter: Admit: 2018-09-08 | Discharge: 2018-09-08 | Payer: Commercial Managed Care - PPO

## 2018-09-08 DIAGNOSIS — C719 Malignant neoplasm of brain, unspecified: Principal | ICD-10-CM

## 2018-09-08 LAB — CBC CELLULAR THERAPEUTICS: Lab: 10 K/UL — ABNORMAL LOW (ref >60)

## 2018-09-08 LAB — BASIC METABOLIC PANEL CELLULAR THERAPEUTICS: Lab: 136 MMOL/L — ABNORMAL LOW (ref >60)

## 2018-09-08 MED ORDER — DEXAMETHASONE 4 MG PO TAB
4 mg | Freq: Once | ORAL | 0 refills | Status: CP
Start: 2018-09-08 — End: ?
  Administered 2018-09-08: 19:00:00 4 mg via ORAL

## 2018-09-08 MED ORDER — DIVALPROEX 500 MG PO TBEC
500 mg | Freq: Once | ORAL | 0 refills | Status: CP
Start: 2018-09-08 — End: ?
  Administered 2018-09-08: 19:00:00 500 mg via ORAL

## 2018-09-08 MED ORDER — MAGNESIUM OXIDE 400 MG (241.3 MG MAGNESIUM) PO TAB
400 mg | Freq: Once | ORAL | 0 refills | Status: CP
Start: 2018-09-08 — End: ?
  Administered 2018-09-08: 19:00:00 400 mg via ORAL

## 2018-09-09 ENCOUNTER — Encounter: Admit: 2018-09-09 | Discharge: 2018-09-09 | Payer: Commercial Managed Care - PPO

## 2018-09-09 MED ORDER — ACETAMINOPHEN 325 MG PO TAB
650 mg | ORAL | 0 refills | Status: AC | PRN
Start: 2018-09-09 — End: ?

## 2018-09-09 MED ORDER — BUPROPION XL 150 MG PO TB24
150 mg | ORAL_TABLET | Freq: Every day | ORAL | 1 refills | Status: AC
Start: 2018-09-09 — End: 2019-09-15

## 2018-09-09 MED ORDER — DEXAMETHASONE 2 MG PO TAB
2 mg | ORAL_TABLET | Freq: Every day | ORAL | 0 refills | 12.50000 days | Status: AC
Start: 2018-09-09 — End: 2018-09-21

## 2018-09-09 MED ORDER — TRAMADOL 50 MG PO TAB
50 mg | ORAL_TABLET | ORAL | 0 refills | Status: AC | PRN
Start: 2018-09-09 — End: ?

## 2018-09-09 MED ORDER — TAMSULOSIN 0.4 MG PO CAP
0.4 mg | ORAL_CAPSULE | Freq: Every day | ORAL | 1 refills | 90.00000 days | Status: AC
Start: 2018-09-09 — End: 2018-11-16

## 2018-09-09 MED ORDER — POLYETHYLENE GLYCOL 3350 17 GRAM PO PWPK
17 g | Freq: Every day | ORAL | 0 refills | 18.00000 days | Status: DC
Start: 2018-09-09 — End: 2020-03-23

## 2018-09-09 MED ORDER — PANTOPRAZOLE 40 MG PO TBEC
40 mg | ORAL_TABLET | Freq: Every day | ORAL | 1 refills | 90.00000 days | Status: AC
Start: 2018-09-09 — End: 2018-09-21

## 2018-09-09 MED ORDER — DULOXETINE 60 MG PO CPDR
60 mg | ORAL_CAPSULE | Freq: Every day | ORAL | 1 refills | 60.00000 days | Status: AC
Start: 2018-09-09 — End: ?

## 2018-09-09 MED ORDER — ASPIRIN 81 MG PO CHEW
81 mg | ORAL_TABLET | Freq: Every day | ORAL | 0 refills | Status: AC
Start: 2018-09-09 — End: 2018-11-16

## 2018-09-09 MED ORDER — LEVETIRACETAM 500 MG PO TAB
500 mg | ORAL_TABLET | Freq: Two times a day (BID) | ORAL | 0 refills | 90.00000 days | Status: AC
Start: 2018-09-09 — End: 2018-11-16

## 2018-09-09 MED ORDER — MELATONIN 5 MG PO TAB
5 mg | Freq: Every evening | ORAL | 0 refills | 28.00000 days | Status: DC
Start: 2018-09-09 — End: 2018-10-01

## 2018-09-10 ENCOUNTER — Encounter: Admit: 2018-09-10 | Discharge: 2018-09-10 | Payer: Commercial Managed Care - PPO

## 2018-09-10 ENCOUNTER — Inpatient Hospital Stay: Admit: 2018-08-31 | Discharge: 2018-09-10 | Payer: Commercial Managed Care - PPO

## 2018-09-10 ENCOUNTER — Ambulatory Visit: Admit: 2018-09-10 | Discharge: 2018-09-10 | Payer: Commercial Managed Care - PPO

## 2018-09-10 ENCOUNTER — Ambulatory Visit: Admit: 2018-08-27 | Discharge: 2018-08-27 | Payer: Commercial Managed Care - PPO

## 2018-09-10 DIAGNOSIS — F900 Attention-deficit hyperactivity disorder, predominantly inattentive type: ICD-10-CM

## 2018-09-10 DIAGNOSIS — Z90710 Acquired absence of both cervix and uterus: ICD-10-CM

## 2018-09-10 DIAGNOSIS — E039 Hypothyroidism, unspecified: ICD-10-CM

## 2018-09-10 DIAGNOSIS — I2510 Atherosclerotic heart disease of native coronary artery without angina pectoris: ICD-10-CM

## 2018-09-10 DIAGNOSIS — G4700 Insomnia, unspecified: ICD-10-CM

## 2018-09-10 DIAGNOSIS — R531 Weakness: ICD-10-CM

## 2018-09-10 DIAGNOSIS — G3184 Mild cognitive impairment, so stated: ICD-10-CM

## 2018-09-10 DIAGNOSIS — R739 Hyperglycemia, unspecified: ICD-10-CM

## 2018-09-10 DIAGNOSIS — Z9049 Acquired absence of other specified parts of digestive tract: ICD-10-CM

## 2018-09-10 DIAGNOSIS — C712 Malignant neoplasm of temporal lobe: Principal | ICD-10-CM

## 2018-09-10 DIAGNOSIS — F17210 Nicotine dependence, cigarettes, uncomplicated: ICD-10-CM

## 2018-09-10 DIAGNOSIS — C713 Malignant neoplasm of parietal lobe: Secondary | ICD-10-CM

## 2018-09-10 DIAGNOSIS — T380X5D Adverse effect of glucocorticoids and synthetic analogues, subsequent encounter: ICD-10-CM

## 2018-09-10 DIAGNOSIS — N83202 Unspecified ovarian cyst, left side: ICD-10-CM

## 2018-09-10 DIAGNOSIS — G8104 Flaccid hemiplegia affecting left nondominant side: ICD-10-CM

## 2018-09-10 DIAGNOSIS — F419 Anxiety disorder, unspecified: ICD-10-CM

## 2018-09-10 DIAGNOSIS — R339 Retention of urine, unspecified: ICD-10-CM

## 2018-09-10 DIAGNOSIS — K5900 Constipation, unspecified: ICD-10-CM

## 2018-09-10 DIAGNOSIS — E785 Hyperlipidemia, unspecified: ICD-10-CM

## 2018-09-10 DIAGNOSIS — Z23 Encounter for immunization: ICD-10-CM

## 2018-09-10 DIAGNOSIS — F909 Attention-deficit hyperactivity disorder, unspecified type: ICD-10-CM

## 2018-09-10 DIAGNOSIS — R5381 Other malaise: ICD-10-CM

## 2018-09-10 DIAGNOSIS — R51 Headache: ICD-10-CM

## 2018-09-10 DIAGNOSIS — I10 Essential (primary) hypertension: ICD-10-CM

## 2018-09-10 DIAGNOSIS — G8918 Other acute postprocedural pain: ICD-10-CM

## 2018-09-10 DIAGNOSIS — Z9660 Presence of unspecified orthopedic joint implant: ICD-10-CM

## 2018-09-10 DIAGNOSIS — G9389 Other specified disorders of brain: ICD-10-CM

## 2018-09-10 DIAGNOSIS — H53462 Homonymous bilateral field defects, left side: ICD-10-CM

## 2018-09-10 DIAGNOSIS — F330 Major depressive disorder, recurrent, mild: ICD-10-CM

## 2018-09-10 DIAGNOSIS — Z7409 Other reduced mobility: ICD-10-CM

## 2018-09-10 DIAGNOSIS — F4323 Adjustment disorder with mixed anxiety and depressed mood: ICD-10-CM

## 2018-09-10 LAB — CBC CELLULAR THERAPEUTICS: Lab: 9.3 K/UL — ABNORMAL LOW (ref >60)

## 2018-09-10 LAB — BASIC METABOLIC PANEL CELLULAR THERAPEUTICS: Lab: 137 MMOL/L — ABNORMAL LOW (ref >60)

## 2018-09-11 ENCOUNTER — Ambulatory Visit: Admit: 2018-09-11 | Discharge: 2018-09-25 | Payer: Commercial Managed Care - PPO

## 2018-09-13 ENCOUNTER — Ambulatory Visit: Admit: 2018-09-13 | Discharge: 2018-09-13 | Payer: Commercial Managed Care - PPO

## 2018-09-13 DIAGNOSIS — G9389 Other specified disorders of brain: Principal | ICD-10-CM

## 2018-09-13 MED ORDER — GADOBENATE DIMEGLUMINE 529 MG/ML (0.1MMOL/0.2ML) IV SOLN
20 mL | Freq: Once | INTRAVENOUS | 0 refills | Status: CP
Start: 2018-09-13 — End: ?
  Administered 2018-09-13: 22:00:00 20 mL via INTRAVENOUS

## 2018-09-16 ENCOUNTER — Encounter: Admit: 2018-09-16 | Discharge: 2018-09-16 | Payer: Commercial Managed Care - PPO

## 2018-09-17 ENCOUNTER — Encounter: Admit: 2018-09-17 | Discharge: 2018-09-17 | Payer: Commercial Managed Care - PPO

## 2018-09-17 ENCOUNTER — Ambulatory Visit: Admit: 2018-09-17 | Discharge: 2018-09-17 | Payer: Commercial Managed Care - PPO

## 2018-09-17 DIAGNOSIS — E079 Disorder of thyroid, unspecified: ICD-10-CM

## 2018-09-17 DIAGNOSIS — F329 Major depressive disorder, single episode, unspecified: ICD-10-CM

## 2018-09-17 DIAGNOSIS — I10 Essential (primary) hypertension: Principal | ICD-10-CM

## 2018-09-17 DIAGNOSIS — F909 Attention-deficit hyperactivity disorder, unspecified type: ICD-10-CM

## 2018-09-20 ENCOUNTER — Encounter: Admit: 2018-09-20 | Discharge: 2018-09-20 | Payer: Commercial Managed Care - PPO

## 2018-09-20 ENCOUNTER — Ambulatory Visit: Admit: 2018-09-20 | Discharge: 2018-09-21 | Payer: Commercial Managed Care - PPO

## 2018-09-20 DIAGNOSIS — I10 Essential (primary) hypertension: Principal | ICD-10-CM

## 2018-09-20 DIAGNOSIS — E079 Disorder of thyroid, unspecified: ICD-10-CM

## 2018-09-20 DIAGNOSIS — C719 Malignant neoplasm of brain, unspecified: Principal | ICD-10-CM

## 2018-09-20 DIAGNOSIS — F329 Major depressive disorder, single episode, unspecified: ICD-10-CM

## 2018-09-20 DIAGNOSIS — F909 Attention-deficit hyperactivity disorder, unspecified type: ICD-10-CM

## 2018-09-20 MED ORDER — CELECOXIB 200 MG PO CAP
200 mg | ORAL_CAPSULE | Freq: Every day | ORAL | 1 refills | Status: AC
Start: 2018-09-20 — End: 2018-11-16

## 2018-09-20 MED ORDER — TEMOZOLOMIDE 180 MG PO CAP
75 mg/m2 | ORAL_CAPSULE | Freq: Every day | ORAL | 0 refills | Status: AC
Start: 2018-09-20 — End: 2018-09-21

## 2018-09-20 MED ORDER — DIVALPROEX 250 MG PO TBEC
ORAL_TABLET | ORAL | 1 refills | 30.00000 days | Status: AC
Start: 2018-09-20 — End: 2018-11-16

## 2018-09-20 MED ORDER — ONDANSETRON HCL 8 MG PO TAB
ORAL_TABLET | Freq: Four times a day (QID) | ORAL | 11 refills | 8.00000 days | Status: AC
Start: 2018-09-20 — End: 2018-12-15
  Filled 2018-09-21 (×2): qty 30, 3d supply, fill #1
  Filled 2018-09-21: qty 30, 3d supply

## 2018-09-21 ENCOUNTER — Encounter: Admit: 2018-09-21 | Discharge: 2018-09-21 | Payer: Commercial Managed Care - PPO

## 2018-09-21 DIAGNOSIS — E079 Disorder of thyroid, unspecified: ICD-10-CM

## 2018-09-21 DIAGNOSIS — F329 Major depressive disorder, single episode, unspecified: ICD-10-CM

## 2018-09-21 DIAGNOSIS — C719 Malignant neoplasm of brain, unspecified: Principal | ICD-10-CM

## 2018-09-21 DIAGNOSIS — I10 Essential (primary) hypertension: Principal | ICD-10-CM

## 2018-09-21 DIAGNOSIS — F909 Attention-deficit hyperactivity disorder, unspecified type: ICD-10-CM

## 2018-09-21 MED ORDER — ALBUTEROL SULFATE 2.5 MG /3 ML (0.083 %) IN NEBU
2.5 mg | RESPIRATORY_TRACT | 0 refills | Status: CN | PRN
Start: 2018-09-21 — End: ?

## 2018-09-21 MED ORDER — ALBUTEROL SULFATE 90 MCG/ACTUATION IN HFAA
2 | RESPIRATORY_TRACT | 1 refills | Status: AC | PRN
Start: 2018-09-21 — End: 2018-12-15

## 2018-09-21 MED ORDER — PENTAMIDINE 300 MG INH SOLR
300 mg | Freq: Once | RESPIRATORY_TRACT | 0 refills | Status: CN
Start: 2018-09-21 — End: ?

## 2018-09-21 MED ORDER — TEMOZOLOMIDE 180 MG PO CAP
ORAL_CAPSULE | 0 refills | Status: AC
Start: 2018-09-21 — End: 2018-11-16

## 2018-09-22 ENCOUNTER — Encounter: Admit: 2018-09-22 | Discharge: 2018-09-22 | Payer: Commercial Managed Care - PPO

## 2018-09-22 DIAGNOSIS — 1 ERRONEOUS ENCOUNTER--DISREGARD: Principal | ICD-10-CM

## 2018-09-24 ENCOUNTER — Encounter: Admit: 2018-09-24 | Discharge: 2018-09-24 | Payer: Commercial Managed Care - PPO

## 2018-09-25 DIAGNOSIS — G9389 Other specified disorders of brain: Principal | ICD-10-CM

## 2018-09-26 ENCOUNTER — Ambulatory Visit: Admit: 2018-09-26 | Discharge: 2018-10-10 | Payer: Commercial Managed Care - PPO

## 2018-09-27 ENCOUNTER — Encounter: Admit: 2018-09-27 | Discharge: 2018-09-27 | Payer: Commercial Managed Care - PPO

## 2018-09-27 ENCOUNTER — Ambulatory Visit: Admit: 2018-09-27 | Discharge: 2018-09-27 | Payer: Commercial Managed Care - PPO

## 2018-09-27 DIAGNOSIS — C719 Malignant neoplasm of brain, unspecified: Principal | ICD-10-CM

## 2018-09-27 DIAGNOSIS — I10 Essential (primary) hypertension: Principal | ICD-10-CM

## 2018-09-27 DIAGNOSIS — F909 Attention-deficit hyperactivity disorder, unspecified type: ICD-10-CM

## 2018-09-27 DIAGNOSIS — 1 ERRONEOUS ENCOUNTER--DISREGARD: ICD-10-CM

## 2018-09-27 DIAGNOSIS — E079 Disorder of thyroid, unspecified: ICD-10-CM

## 2018-09-27 DIAGNOSIS — F329 Major depressive disorder, single episode, unspecified: ICD-10-CM

## 2018-09-28 ENCOUNTER — Encounter: Admit: 2018-09-28 | Discharge: 2018-09-28 | Payer: Commercial Managed Care - PPO

## 2018-09-28 ENCOUNTER — Ambulatory Visit: Admit: 2018-09-28 | Discharge: 2018-09-29 | Payer: Commercial Managed Care - PPO

## 2018-09-28 DIAGNOSIS — G9389 Other specified disorders of brain: Principal | ICD-10-CM

## 2018-09-28 LAB — CBC AND DIFF
Lab: 0 10*3/uL (ref 0–0.20)
Lab: 0.1 10*3/uL (ref 0–0.45)
Lab: 0.5 10*3/uL (ref 0–0.80)
Lab: 1.8 10*3/uL (ref 1.0–4.8)
Lab: 11 g/dL — ABNORMAL LOW (ref 12.0–15.0)
Lab: 3.5 M/UL — ABNORMAL LOW (ref 4.0–5.0)
Lab: 34 % — ABNORMAL LOW (ref 36–45)
Lab: 6.4 10*3/uL (ref 4.5–11.0)

## 2018-09-28 LAB — COMPREHENSIVE METABOLIC PANEL
Lab: 137 MMOL/L (ref 137–147)
Lab: 3.9 MMOL/L (ref 3.5–5.1)

## 2018-09-29 ENCOUNTER — Encounter: Admit: 2018-09-29 | Discharge: 2018-09-29 | Payer: Commercial Managed Care - PPO

## 2018-09-30 ENCOUNTER — Encounter: Admit: 2018-09-30 | Discharge: 2018-09-30 | Payer: Commercial Managed Care - PPO

## 2018-10-01 ENCOUNTER — Encounter: Admit: 2018-10-01 | Discharge: 2018-10-01 | Payer: Commercial Managed Care - PPO

## 2018-10-01 ENCOUNTER — Ambulatory Visit: Admit: 2018-10-01 | Discharge: 2018-10-02 | Payer: Commercial Managed Care - PPO

## 2018-10-01 DIAGNOSIS — F329 Major depressive disorder, single episode, unspecified: ICD-10-CM

## 2018-10-01 DIAGNOSIS — C719 Malignant neoplasm of brain, unspecified: Principal | ICD-10-CM

## 2018-10-01 DIAGNOSIS — E079 Disorder of thyroid, unspecified: ICD-10-CM

## 2018-10-01 DIAGNOSIS — F909 Attention-deficit hyperactivity disorder, unspecified type: ICD-10-CM

## 2018-10-01 DIAGNOSIS — I10 Essential (primary) hypertension: Principal | ICD-10-CM

## 2018-10-04 ENCOUNTER — Encounter: Admit: 2018-10-04 | Discharge: 2018-10-04 | Payer: Commercial Managed Care - PPO

## 2018-10-05 ENCOUNTER — Encounter: Admit: 2018-10-05 | Discharge: 2018-10-05 | Payer: Commercial Managed Care - PPO

## 2018-10-05 DIAGNOSIS — C719 Malignant neoplasm of brain, unspecified: Principal | ICD-10-CM

## 2018-10-05 LAB — CBC AND DIFF
Lab: 0 10*3/uL (ref 0–0.20)
Lab: 0 10*3/uL (ref 0–0.45)
Lab: 0.4 10*3/uL (ref 0–0.80)
Lab: 1.5 10*3/uL (ref 1.0–4.8)
Lab: 11 g/dL — ABNORMAL LOW (ref 12.0–15.0)
Lab: 14 % (ref 11–15)
Lab: 256 10*3/uL (ref 150–400)
Lab: 3.5 M/UL — ABNORMAL LOW (ref 4.0–5.0)
Lab: 32 pg (ref 26–34)
Lab: 33 % — ABNORMAL LOW (ref 36–45)
Lab: 34 g/dL (ref 32.0–36.0)
Lab: 4.3 10*3/uL (ref 1.8–7.0)
Lab: 6.3 10*3/uL (ref 4.5–11.0)
Lab: 94 FL (ref 80–100)

## 2018-10-05 LAB — COMPREHENSIVE METABOLIC PANEL
Lab: 0.7 mg/dL (ref 0.4–1.00)
Lab: 105 MMOL/L (ref 98–110)
Lab: 12 mg/dL (ref 7–25)
Lab: 141 MMOL/L (ref 137–147)
Lab: 3.9 MMOL/L (ref 3.5–5.1)
Lab: 89 mg/dL (ref 70–100)
Lab: 9 mg/dL (ref 8.5–10.6)

## 2018-10-06 ENCOUNTER — Ambulatory Visit: Admit: 2018-10-06 | Discharge: 2018-10-06 | Payer: Commercial Managed Care - PPO

## 2018-10-06 ENCOUNTER — Encounter: Admit: 2018-10-06 | Discharge: 2018-10-06 | Payer: Commercial Managed Care - PPO

## 2018-10-06 MED ORDER — DEXAMETHASONE 4 MG PO TAB
8 mg | ORAL_TABLET | Freq: Every day | ORAL | 1 refills | 12.50000 days | Status: AC
Start: 2018-10-06 — End: 2018-11-16

## 2018-10-07 ENCOUNTER — Encounter: Admit: 2018-10-07 | Discharge: 2018-10-07 | Payer: Commercial Managed Care - PPO

## 2018-10-07 MED FILL — ONDANSETRON HCL 8 MG PO TAB: 8 mg | 3 days supply | Qty: 30 | Fill #2 | Status: AC

## 2018-10-08 ENCOUNTER — Encounter: Admit: 2018-10-08 | Discharge: 2018-10-09 | Payer: Commercial Managed Care - PPO

## 2018-10-08 ENCOUNTER — Encounter: Admit: 2018-10-08 | Discharge: 2018-10-08 | Payer: Commercial Managed Care - PPO

## 2018-10-08 ENCOUNTER — Ambulatory Visit: Admit: 2018-10-08 | Discharge: 2018-10-08 | Payer: Commercial Managed Care - PPO

## 2018-10-08 MED ORDER — ALBUTEROL SULFATE 2.5 MG /3 ML (0.083 %) IN NEBU
2.5 mg | RESPIRATORY_TRACT | 0 refills | Status: DC | PRN
Start: 2018-10-08 — End: 2018-10-13
  Administered 2018-10-08: 17:00:00 2.5 mg via RESPIRATORY_TRACT

## 2018-10-08 MED ORDER — PENTAMIDINE 300 MG INH SOLR
300 mg | Freq: Once | RESPIRATORY_TRACT | 0 refills | Status: CP
Start: 2018-10-08 — End: ?
  Administered 2018-10-08: 18:00:00 300 mg via RESPIRATORY_TRACT

## 2018-10-10 ENCOUNTER — Ambulatory Visit: Admit: 2018-09-27 | Discharge: 2018-09-27 | Payer: Commercial Managed Care - PPO

## 2018-10-10 DIAGNOSIS — C719 Malignant neoplasm of brain, unspecified: Secondary | ICD-10-CM

## 2018-10-10 DIAGNOSIS — G9389 Other specified disorders of brain: Principal | ICD-10-CM

## 2018-10-11 ENCOUNTER — Encounter: Admit: 2018-10-11 | Discharge: 2018-10-11 | Payer: Commercial Managed Care - PPO

## 2018-10-11 ENCOUNTER — Ambulatory Visit: Admit: 2018-10-11 | Discharge: 2018-10-12 | Payer: Commercial Managed Care - PPO

## 2018-10-11 LAB — CBC AND DIFF
Lab: 12 K/UL — ABNORMAL HIGH (ref 4.5–11.0)
Lab: 3.9 M/UL — ABNORMAL LOW (ref 4.0–5.0)

## 2018-10-11 LAB — COMPREHENSIVE METABOLIC PANEL
Lab: 101 MMOL/L (ref 98–110)
Lab: 137 MMOL/L (ref 137–147)
Lab: 4.3 MMOL/L (ref 3.5–5.1)

## 2018-10-12 ENCOUNTER — Encounter: Admit: 2018-10-12 | Discharge: 2018-10-12 | Payer: Commercial Managed Care - PPO

## 2018-10-13 ENCOUNTER — Encounter: Admit: 2018-10-13 | Discharge: 2018-10-13 | Payer: Commercial Managed Care - PPO

## 2018-10-14 ENCOUNTER — Encounter: Admit: 2018-10-14 | Discharge: 2018-10-14 | Payer: Commercial Managed Care - PPO

## 2018-10-15 ENCOUNTER — Ambulatory Visit: Admit: 2018-10-15 | Discharge: 2018-10-15 | Payer: Commercial Managed Care - PPO

## 2018-10-15 ENCOUNTER — Encounter: Admit: 2018-10-15 | Discharge: 2018-10-15 | Payer: Commercial Managed Care - PPO

## 2018-10-15 DIAGNOSIS — C719 Malignant neoplasm of brain, unspecified: Secondary | ICD-10-CM

## 2018-10-18 ENCOUNTER — Encounter: Admit: 2018-10-18 | Discharge: 2018-10-18 | Payer: Commercial Managed Care - PPO

## 2018-10-18 DIAGNOSIS — C719 Malignant neoplasm of brain, unspecified: Principal | ICD-10-CM

## 2018-10-18 LAB — COMPREHENSIVE METABOLIC PANEL
Lab: 0.8 mg/dL (ref 0.4–1.00)
Lab: 101 MMOL/L (ref 98–110)
Lab: 13 U/L — ABNORMAL LOW (ref 7–40)
Lab: 138 MMOL/L (ref 137–147)
Lab: 21 U/L (ref 7–56)
Lab: 22 mg/dL (ref 7–25)
Lab: 28 MMOL/L (ref 21–30)
Lab: 3.5 g/dL (ref 3.5–5.0)
Lab: 4.2 MMOL/L (ref 3.5–5.1)
Lab: 6.2 g/dL (ref 6.0–8.0)
Lab: 9 g/dL — ABNORMAL HIGH (ref 3–12)
Lab: 9 mg/dL (ref 8.5–10.6)
Lab: >60 mL/min (ref >60)
Lab: >60 mL/min (ref >60)

## 2018-10-18 LAB — CBC AND DIFF
Lab: 0 10*3/uL (ref 0–0.20)
Lab: 0 10*3/uL (ref 0–0.45)
Lab: 0.6 10*3/uL (ref 0–0.80)

## 2018-10-18 MED FILL — ONDANSETRON HCL 8 MG PO TAB: 8 mg | 3 days supply | Qty: 30 | Fill #3 | Status: AC

## 2018-10-19 ENCOUNTER — Encounter: Admit: 2018-10-19 | Discharge: 2018-10-19 | Payer: Commercial Managed Care - PPO

## 2018-10-21 ENCOUNTER — Encounter: Admit: 2018-10-21 | Discharge: 2018-10-21 | Payer: Commercial Managed Care - PPO

## 2018-10-22 ENCOUNTER — Encounter: Admit: 2018-10-22 | Discharge: 2018-10-22 | Payer: Commercial Managed Care - PPO

## 2018-10-22 ENCOUNTER — Ambulatory Visit: Admit: 2018-10-22 | Discharge: 2018-10-22 | Payer: Commercial Managed Care - PPO

## 2018-10-24 ENCOUNTER — Encounter: Admit: 2018-10-24 | Discharge: 2018-10-24 | Payer: Commercial Managed Care - PPO

## 2018-10-25 ENCOUNTER — Encounter: Admit: 2018-10-25 | Discharge: 2018-10-25 | Payer: Commercial Managed Care - PPO

## 2018-10-25 DIAGNOSIS — C719 Malignant neoplasm of brain, unspecified: Principal | ICD-10-CM

## 2018-10-25 LAB — COMPREHENSIVE METABOLIC PANEL
Lab: 0.4 mg/dL (ref 0.3–1.2)
Lab: 0.7 mg/dL (ref 0.4–1.00)
Lab: 102 MMOL/L — ABNORMAL LOW (ref >60)
Lab: 138 MMOL/L (ref 137–147)
Lab: 14 U/L (ref 7–40)
Lab: 22 U/L — ABNORMAL LOW (ref 25–110)
Lab: 25 mg/dL — ABNORMAL HIGH (ref 7–25)
Lab: 26 U/L (ref 7–56)
Lab: 3.1 g/dL — ABNORMAL LOW (ref 3.5–5.0)
Lab: 30 MMOL/L (ref 21–30)
Lab: 4.7 MMOL/L (ref 3.5–5.1)
Lab: 5.7 g/dL — ABNORMAL LOW (ref 6.0–8.0)
Lab: 8.7 mg/dL (ref 8.5–10.6)
Lab: 94 mg/dL — ABNORMAL LOW (ref >60)
Lab: >60 mL/min (ref >60)
Lab: >60 mL/min (ref >60)
Lab: 6 (ref 3–12)

## 2018-10-25 LAB — CBC AND DIFF
Lab: 12 g/dL (ref 12.0–15.0)
Lab: 15 % — ABNORMAL HIGH (ref 11–15)
Lab: 3.9 M/UL — ABNORMAL LOW (ref 4.0–5.0)
Lab: 31 pg (ref 26–34)
Lab: 33 g/dL (ref 32.0–36.0)
Lab: 37 % (ref 36–45)
Lab: 4 % (ref 4–12)
Lab: 7 FL (ref 7–11)
Lab: 9.5 10*3/uL (ref 4.5–11.0)
Lab: 91 % — ABNORMAL HIGH (ref 41–77)
Lab: 95 FL (ref 80–100)

## 2018-10-25 MED FILL — ONDANSETRON HCL 8 MG PO TAB: 8 mg | 3 days supply | Qty: 30 | Fill #4 | Status: AC

## 2018-10-26 ENCOUNTER — Encounter: Admit: 2018-10-26 | Discharge: 2018-10-26 | Payer: Commercial Managed Care - PPO

## 2018-10-26 ENCOUNTER — Ambulatory Visit: Admit: 2018-10-11 | Discharge: 2018-10-26 | Payer: Commercial Managed Care - PPO

## 2018-10-26 DIAGNOSIS — G9389 Other specified disorders of brain: Principal | ICD-10-CM

## 2018-10-26 DIAGNOSIS — C719 Malignant neoplasm of brain, unspecified: ICD-10-CM

## 2018-10-27 ENCOUNTER — Ambulatory Visit: Admit: 2018-10-27 | Discharge: 2018-11-10 | Payer: Commercial Managed Care - PPO

## 2018-10-28 ENCOUNTER — Encounter: Admit: 2018-10-28 | Discharge: 2018-10-28 | Payer: Commercial Managed Care - PPO

## 2018-11-01 ENCOUNTER — Encounter: Admit: 2018-11-01 | Discharge: 2018-11-01 | Payer: Commercial Managed Care - PPO

## 2018-11-01 ENCOUNTER — Ambulatory Visit: Admit: 2018-11-01 | Discharge: 2018-11-01 | Payer: Commercial Managed Care - PPO

## 2018-11-01 DIAGNOSIS — C719 Malignant neoplasm of brain, unspecified: Secondary | ICD-10-CM

## 2018-11-01 LAB — CBC AND DIFF
Lab: 12 g/dL (ref 12.0–15.0)
Lab: 16 % — ABNORMAL HIGH (ref 11–15)
Lab: 3.8 M/UL — ABNORMAL LOW (ref 4.0–5.0)
Lab: 33 pg — ABNORMAL LOW (ref 26–34)
Lab: 34 g/dL (ref 32.0–36.0)
Lab: 37 % (ref 36–45)
Lab: 7 FL (ref 7–11)
Lab: 8.8 10*3/uL (ref 4.5–11.0)
Lab: 87 K/UL — ABNORMAL LOW (ref 150–400)
Lab: 97 FL — ABNORMAL LOW (ref 80–100)

## 2018-11-01 LAB — COMPREHENSIVE METABOLIC PANEL
Lab: 139 MMOL/L (ref 137–147)
Lab: 25 U/L (ref 7–56)
Lab: 31 MMOL/L — ABNORMAL HIGH (ref 21–30)
Lab: 4.4 MMOL/L (ref 3.5–5.1)
Lab: 7 (ref 3–12)
Lab: >60 mL/min (ref >60)
Lab: >60 mL/min (ref >60)

## 2018-11-01 MED ORDER — FLUCONAZOLE 100 MG PO TAB
100 mg | ORAL_TABLET | Freq: Every day | ORAL | 0 refills | 3.00000 days | Status: AC
Start: 2018-11-01 — End: 2018-11-16

## 2018-11-02 ENCOUNTER — Encounter: Admit: 2018-11-02 | Discharge: 2018-11-02 | Payer: Commercial Managed Care - PPO

## 2018-11-02 LAB — MANUAL DIFF
Lab: 2 % (ref 0–5)
Lab: 3 % (ref 0–10)
Lab: 5 % (ref 4–12)
Lab: 8 % — ABNORMAL LOW (ref 24–44)
Lab: 82 % — ABNORMAL HIGH (ref 41–77)

## 2018-11-03 ENCOUNTER — Encounter: Admit: 2018-11-03 | Discharge: 2018-11-03 | Payer: Commercial Managed Care - PPO

## 2018-11-03 MED FILL — ONDANSETRON HCL 8 MG PO TAB: 8 mg | 3 days supply | Qty: 30 | Fill #5 | Status: AC

## 2018-11-04 ENCOUNTER — Encounter: Admit: 2018-11-04 | Discharge: 2018-11-04 | Payer: Commercial Managed Care - PPO

## 2018-11-05 ENCOUNTER — Ambulatory Visit: Admit: 2018-11-05 | Discharge: 2018-11-06 | Payer: Commercial Managed Care - PPO

## 2018-11-05 ENCOUNTER — Encounter: Admit: 2018-11-05 | Discharge: 2018-11-05 | Payer: Commercial Managed Care - PPO

## 2018-11-05 DIAGNOSIS — E079 Disorder of thyroid, unspecified: Secondary | ICD-10-CM

## 2018-11-05 DIAGNOSIS — F329 Major depressive disorder, single episode, unspecified: Secondary | ICD-10-CM

## 2018-11-05 DIAGNOSIS — C719 Malignant neoplasm of brain, unspecified: Secondary | ICD-10-CM

## 2018-11-05 DIAGNOSIS — F909 Attention-deficit hyperactivity disorder, unspecified type: Secondary | ICD-10-CM

## 2018-11-05 DIAGNOSIS — I10 Essential (primary) hypertension: Secondary | ICD-10-CM

## 2018-11-05 MED ORDER — CYCLOSPORINE 0.05 % OP DPET
1 [drp] | Freq: Two times a day (BID) | OPHTHALMIC | 3 refills | 90.00000 days | Status: AC
Start: 2018-11-05 — End: 2019-12-19

## 2018-11-06 DIAGNOSIS — H53462 Homonymous bilateral field defects, left side: Secondary | ICD-10-CM

## 2018-11-08 LAB — COMPREHENSIVE METABOLIC PANEL

## 2018-11-09 ENCOUNTER — Encounter: Admit: 2018-11-09 | Discharge: 2018-11-09 | Payer: Commercial Managed Care - PPO

## 2018-11-09 DIAGNOSIS — C719 Malignant neoplasm of brain, unspecified: Secondary | ICD-10-CM

## 2018-11-10 DIAGNOSIS — C719 Malignant neoplasm of brain, unspecified: Secondary | ICD-10-CM

## 2018-11-10 DIAGNOSIS — G9389 Other specified disorders of brain: Secondary | ICD-10-CM

## 2018-11-10 LAB — CBC AND DIFF

## 2018-11-11 ENCOUNTER — Encounter: Admit: 2018-11-11 | Discharge: 2018-11-11 | Payer: Commercial Managed Care - PPO

## 2018-11-11 ENCOUNTER — Ambulatory Visit: Admit: 2018-11-11 | Discharge: 2018-11-26 | Payer: Commercial Managed Care - PPO

## 2018-11-11 DIAGNOSIS — C719 Malignant neoplasm of brain, unspecified: Secondary | ICD-10-CM

## 2018-11-12 ENCOUNTER — Encounter: Admit: 2018-11-12 | Discharge: 2018-11-12 | Payer: Commercial Managed Care - PPO

## 2018-11-12 ENCOUNTER — Ambulatory Visit: Admit: 2018-11-12 | Discharge: 2018-11-12 | Payer: Commercial Managed Care - PPO

## 2018-11-12 DIAGNOSIS — C719 Malignant neoplasm of brain, unspecified: Secondary | ICD-10-CM

## 2018-11-12 LAB — CBC AND DIFF

## 2018-11-12 MED ORDER — GADOBENATE DIMEGLUMINE 529 MG/ML (0.1MMOL/0.2ML) IV SOLN
20 mL | Freq: Once | INTRAVENOUS | 0 refills | Status: CP
Start: 2018-11-12 — End: ?
  Administered 2018-11-12: 21:00:00 20 mL via INTRAVENOUS

## 2018-11-16 ENCOUNTER — Encounter: Admit: 2018-11-16 | Discharge: 2018-11-16 | Payer: Commercial Managed Care - PPO

## 2018-11-16 DIAGNOSIS — C719 Malignant neoplasm of brain, unspecified: Secondary | ICD-10-CM

## 2018-11-16 DIAGNOSIS — E079 Disorder of thyroid, unspecified: Secondary | ICD-10-CM

## 2018-11-16 DIAGNOSIS — F909 Attention-deficit hyperactivity disorder, unspecified type: Secondary | ICD-10-CM

## 2018-11-16 DIAGNOSIS — F329 Major depressive disorder, single episode, unspecified: Secondary | ICD-10-CM

## 2018-11-16 DIAGNOSIS — I10 Essential (primary) hypertension: Secondary | ICD-10-CM

## 2018-11-16 DIAGNOSIS — D696 Thrombocytopenia, unspecified: Secondary | ICD-10-CM

## 2018-11-16 DIAGNOSIS — C713 Malignant neoplasm of parietal lobe: Secondary | ICD-10-CM

## 2018-11-16 LAB — CBC AND DIFF
Lab: 0 10*3/uL (ref 0–0.20)
Lab: 0 10*3/uL (ref 0–0.45)
Lab: 0.2 10*3/uL (ref 0–0.80)
Lab: 0.7 10*3/uL — ABNORMAL LOW (ref 1.0–4.8)
Lab: 1 % (ref >60)
Lab: 12 g/dL (ref 12.0–15.0)
Lab: 16 % — ABNORMAL HIGH (ref 11–15)
Lab: 2 % (ref >60)
Lab: 2.2 10*3/uL (ref 1.8–7.0)
Lab: 22 10*3/uL — CL (ref 150–400)
Lab: 23 % — ABNORMAL LOW (ref 24–44)
Lab: 3.2 10*3/uL — ABNORMAL LOW (ref 4.5–11.0)
Lab: 3.7 M/UL — ABNORMAL LOW (ref 4.0–5.0)
Lab: 33 pg (ref 26–34)
Lab: 34 g/dL (ref 32.0–36.0)
Lab: 36 % (ref 36–45)
Lab: 5 % (ref 4–12)
Lab: 69 % (ref 41–77)
Lab: 7.2 FL (ref 7–11)
Lab: 97 FL — ABNORMAL LOW (ref 80–100)

## 2018-11-16 LAB — COMPREHENSIVE METABOLIC PANEL
Lab: 136 MMOL/L — ABNORMAL LOW (ref 137–147)
Lab: 3.4 MMOL/L — ABNORMAL LOW (ref 3.5–5.1)

## 2018-11-17 ENCOUNTER — Encounter: Admit: 2018-11-17 | Discharge: 2018-11-17 | Payer: Commercial Managed Care - PPO

## 2018-11-17 DIAGNOSIS — C719 Malignant neoplasm of brain, unspecified: Secondary | ICD-10-CM

## 2018-11-17 LAB — CBC AND DIFF

## 2018-11-18 ENCOUNTER — Encounter: Admit: 2018-11-18 | Discharge: 2018-11-18 | Payer: Commercial Managed Care - PPO

## 2018-11-18 DIAGNOSIS — C719 Malignant neoplasm of brain, unspecified: Secondary | ICD-10-CM

## 2018-11-18 LAB — CBC AND DIFF

## 2018-11-18 MED ORDER — FLUCONAZOLE 200 MG PO TAB
ORAL_TABLET | Freq: Every day | ORAL | 0 refills | 3.00000 days | Status: AC
Start: 2018-11-18 — End: 2018-12-15

## 2018-11-22 ENCOUNTER — Encounter: Admit: 2018-11-22 | Discharge: 2018-11-22 | Payer: Commercial Managed Care - PPO

## 2018-11-22 DIAGNOSIS — C719 Malignant neoplasm of brain, unspecified: Secondary | ICD-10-CM

## 2018-11-22 LAB — CBC AND DIFF

## 2018-11-22 LAB — COMPREHENSIVE METABOLIC PANEL

## 2018-11-23 ENCOUNTER — Encounter: Admit: 2018-11-23 | Discharge: 2018-11-23 | Payer: Commercial Managed Care - PPO

## 2018-11-25 ENCOUNTER — Encounter: Admit: 2018-11-25 | Discharge: 2018-11-25 | Payer: Commercial Managed Care - PPO

## 2018-11-25 DIAGNOSIS — C719 Malignant neoplasm of brain, unspecified: Secondary | ICD-10-CM

## 2018-11-25 LAB — CBC AND DIFF

## 2018-11-25 LAB — COMPREHENSIVE METABOLIC PANEL

## 2018-11-29 ENCOUNTER — Encounter: Admit: 2018-11-29 | Discharge: 2018-11-29 | Payer: Commercial Managed Care - PPO

## 2018-11-29 LAB — CBC AND DIFF

## 2018-11-29 LAB — COMPREHENSIVE METABOLIC PANEL

## 2018-11-30 ENCOUNTER — Encounter: Admit: 2018-11-30 | Discharge: 2018-11-30 | Payer: Commercial Managed Care - PPO

## 2018-11-30 DIAGNOSIS — C719 Malignant neoplasm of brain, unspecified: Principal | ICD-10-CM

## 2018-11-30 NOTE — Telephone Encounter
LVM with Andrea [name] regarding lab results. Skyelynn educated to call the clinic at [phone] for update and when Dr. Milas Kocher would like next labs drawn.

## 2018-11-30 NOTE — Progress Notes
Case Management Progress Note    Name: Andrea [name]           MRN: [id number]                 DOB: 10-12-1971          Age: 48 y.o.  Date of Service: 11/30/2018        Plan:  Mail pt financial assistance and support resources. SW to remain available to assist as needed.    Interventions:   SW discussed mailing pt financial and support resources at previous visit and pt was agreeable. SW mailed pt American Brain Tumor Association, Brain Tumor Foundation, [facility] financial and support group information, [employer], [employer], [employer], [employer], [employer], [employer], and [employer] for [employer]. SW included SW contact information in packet.    ? Support       ? Info or Referral  Information or Referral to [employer]: Diagnosis-Related [facility]    ? Care Planning       ? Medication Needs       ? Financial       ? Legal       Arnette Felts, LMSW  [phone]

## 2018-12-01 ENCOUNTER — Encounter: Admit: 2018-12-01 | Discharge: 2018-12-01 | Payer: Commercial Managed Care - PPO

## 2018-12-01 DIAGNOSIS — C719 Malignant neoplasm of brain, unspecified: Principal | ICD-10-CM

## 2018-12-01 IMAGING — CT BRAIN WO(Adult)
2 series · 4 of 6 positions shown, 5 images · non-contrast
Comparison: none

[Series 4: brain cor 5.00 hr40 s3 · coronal · 0.35mm/px · 3 of 3 slices shown, 4 images]
[im 1/3  brain]
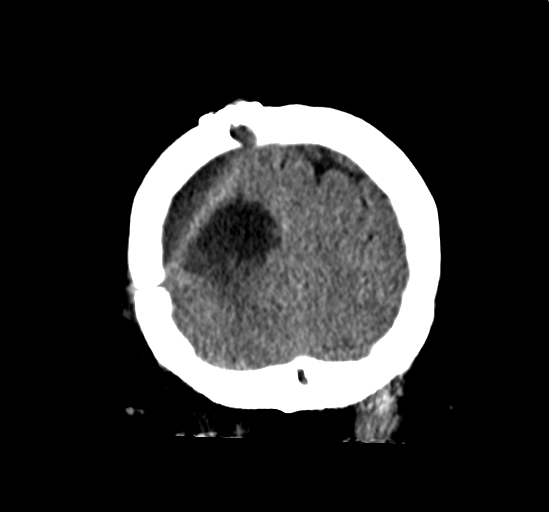
[im 1/3  bone]
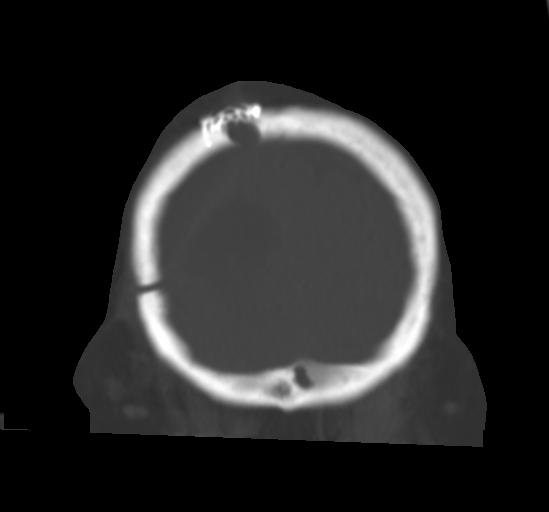
[im 2/3  brain]
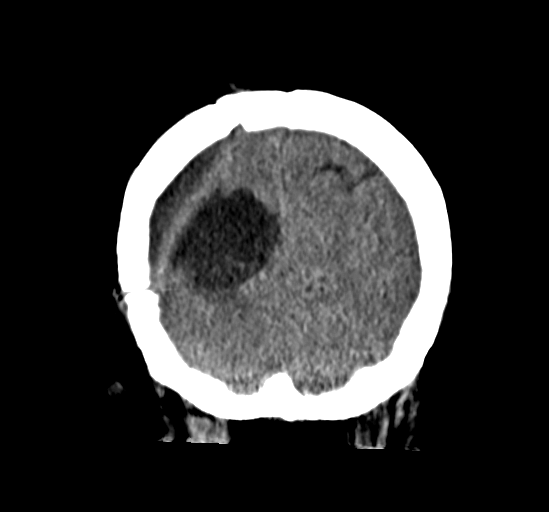
[im 3/3  brain]
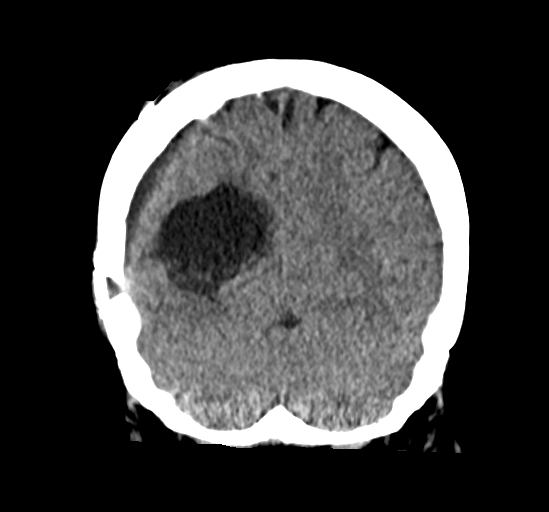

[Series 6: brain sag 5.00 hr40 s3 · sagittal · 0.35mm/px · 1 of 3 slices shown]
[im 2/3  brain]
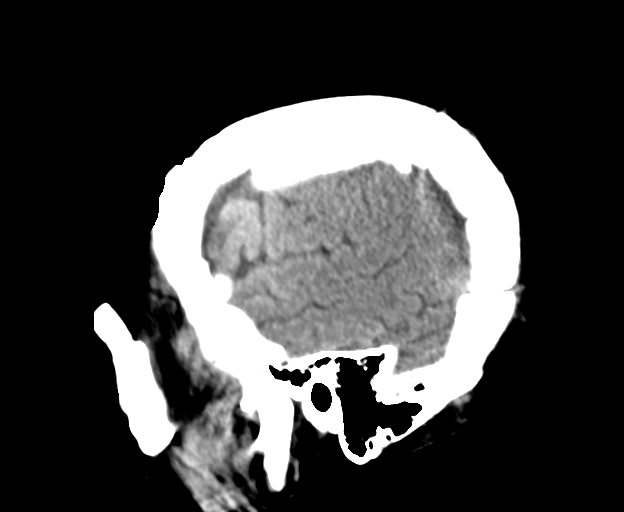

[4 of 6 positions shown; findings below may reference images not displayed]

**ADDENDUM**
ADDENDUM

Number of previous computed tomography exams in the last 12 months is 2.

Number of previous nuclear medicine myocardial perfusion studies in the last 12 months is 0.

TD/TT: /

EXAM

CT head without contast.

INDICATION

Brain tumor post resect, fall 3423. Dizziness
PT C/O INCREASED DIZZINESS. HX OF RECENT BRAIN TUMOR, REMOVED FALL ON 3423. TJ

TECHNIQUE

Volumetric multi detector CT images of the head were obtained without the administration of IV
contrast.

All CT scans at this facility use dose modulation, iterative reconstruction, and/or weight based
dosing when appropriate to reduce radiation dose to as low as reasonably achievable.

COMPARISONS

CT head August 26, 2018

FINDINGS

There is interval postoperative change of the right parietal lobe status post craniotomy and tumor
resection with a minimal amount of chronic loculated subdural fluid at the craniotomy site. There is
overall markedly improved para focal vasogenic edema and subtle midline shift from comparison exam.
The brain parenchyma is otherwise grossly preserved in attenuation and gray-white differentiation.
There is no intra-axial or acute extra-axial fluid. Acute on chronic sinus disease is seen within
the paranasal sinuses. The mastoid air cells are clear.

IMPRESSION

1.  Interval craniotomy and resection of previously seen right parietal lobe tumor with overall
markedly improved edema and subtle midline shift from comparison exam.

2. No evidence of new acute intracranial abnormality.

Tech Notes:

PT C/O INCREASED DIZZINESS. HX OF RECENT BRAIN TUMOR, REMOVED FALL ON 3423. TJ

## 2018-12-02 ENCOUNTER — Encounter: Admit: 2018-12-02 | Discharge: 2018-12-02 | Payer: Commercial Managed Care - PPO

## 2018-12-02 DIAGNOSIS — C719 Malignant neoplasm of brain, unspecified: Principal | ICD-10-CM

## 2018-12-02 MED FILL — ONDANSETRON HCL 8 MG PO TAB: 8 mg | 3 days supply | Qty: 30 | Fill #6 | Status: AC

## 2018-12-06 ENCOUNTER — Encounter: Admit: 2018-12-06 | Discharge: 2018-12-07 | Payer: Commercial Managed Care - PPO

## 2018-12-06 ENCOUNTER — Encounter: Admit: 2018-12-06 | Discharge: 2018-12-06 | Payer: Commercial Managed Care - PPO

## 2018-12-06 DIAGNOSIS — C719 Malignant neoplasm of brain, unspecified: Principal | ICD-10-CM

## 2018-12-06 IMAGING — CT PE(Adult)
2 of 4 series · 13 of 23 positions shown · IV contrast (Omnipaque)
Comparison: none

[Series 5: cta pulmonary ax 3.00 bv36 s3 · axial · 0.71mm/px · z∈[+1680,+1860]mm · 10 of 12 slices shown]
[im 2/12  lung]
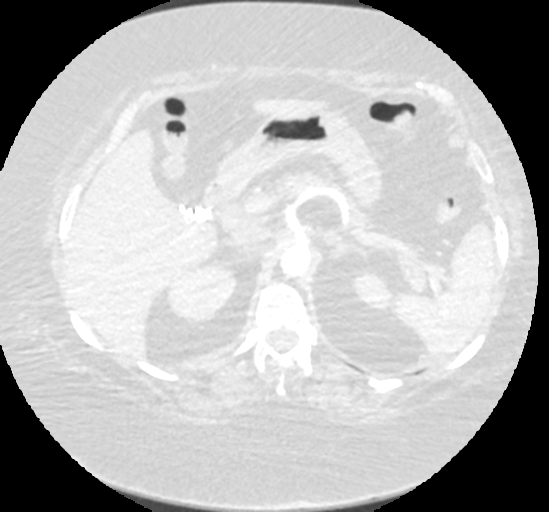
[im 3/12  mediastinal]
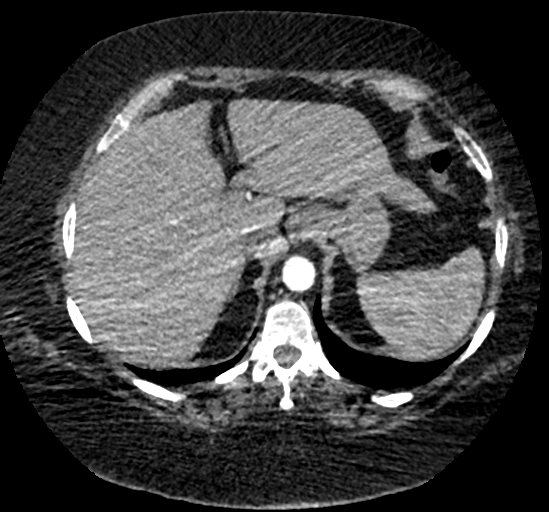
[im 4/12  lung]
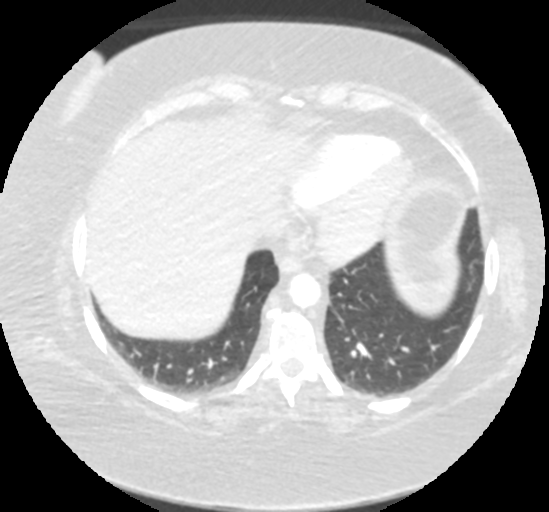
[im 5/12  mediastinal]
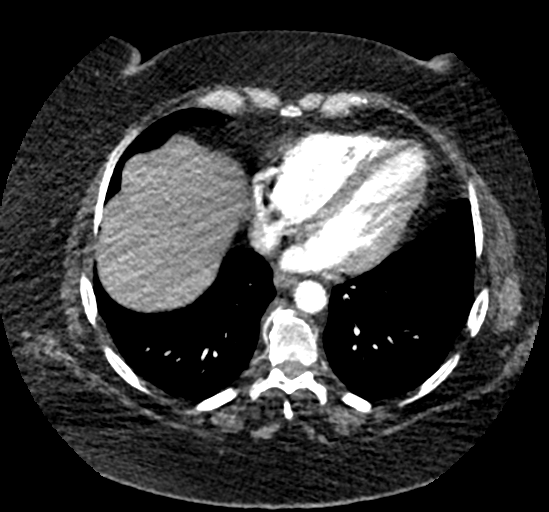
[im 6/12  lung]
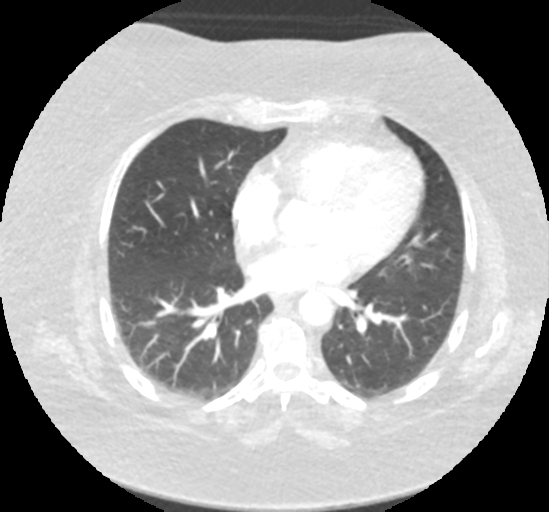
[im 7/12  mediastinal]
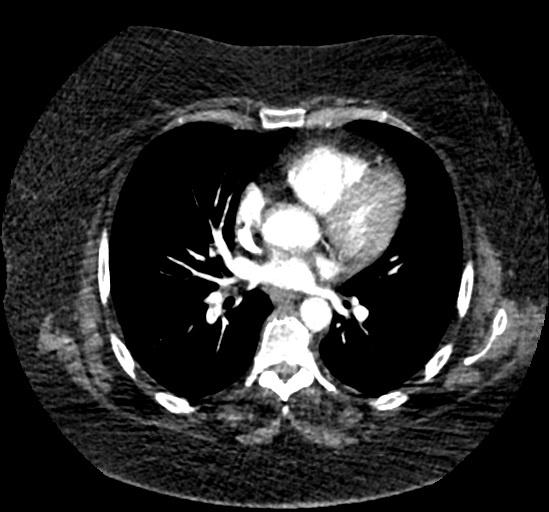
[im 8/12  lung]
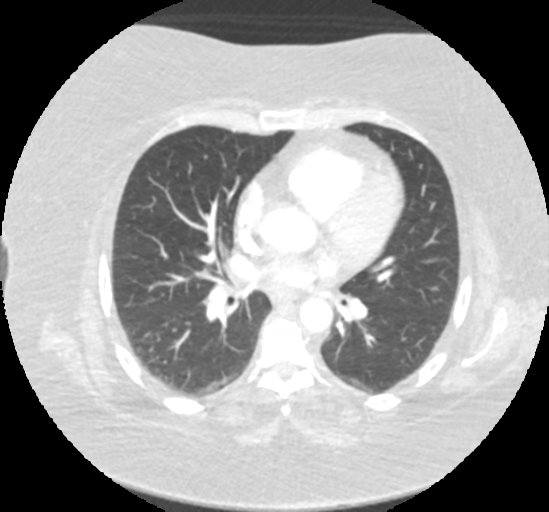
[im 9/12  mediastinal]
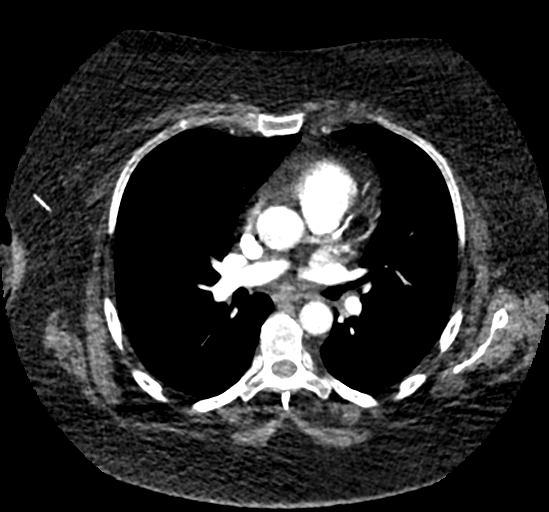
[im 10/12  lung]
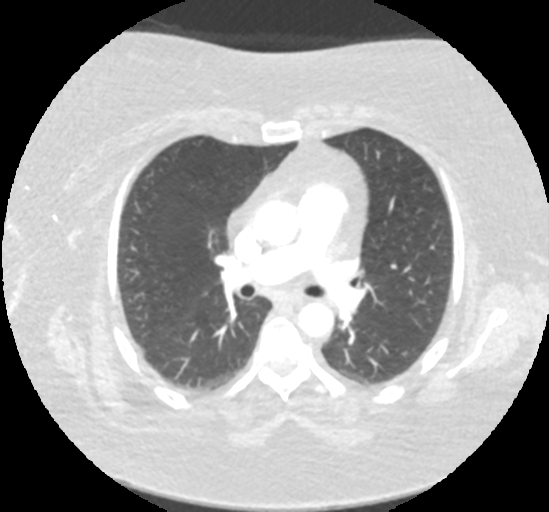
[im 11/12  mediastinal]
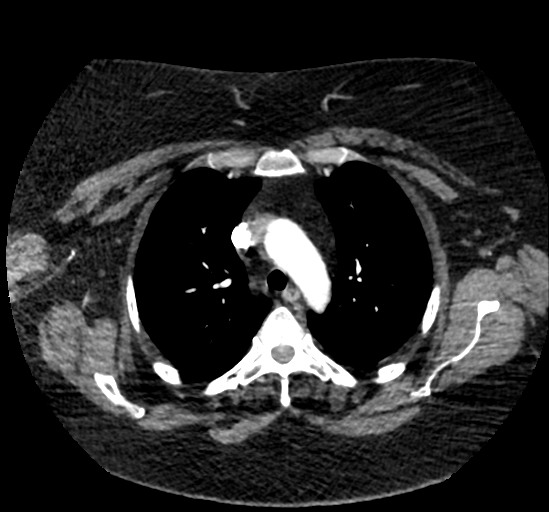

[Series 7: cta pulmonary cor 3.00 bv36 s3 · coronal · 0.61mm/px · 3 of 79 slices shown]
[im 16/79  mediastinal]
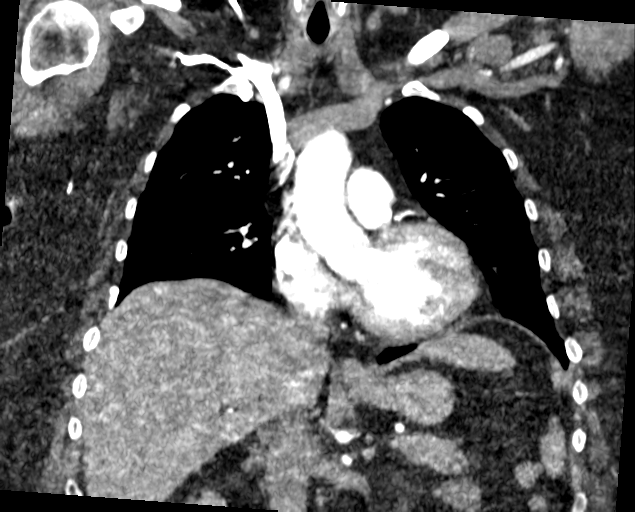
[im 32/79  mediastinal]
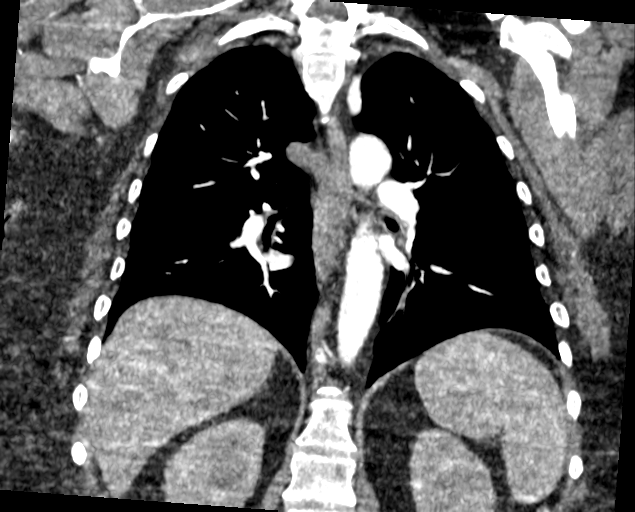
[im 47/79  mediastinal]
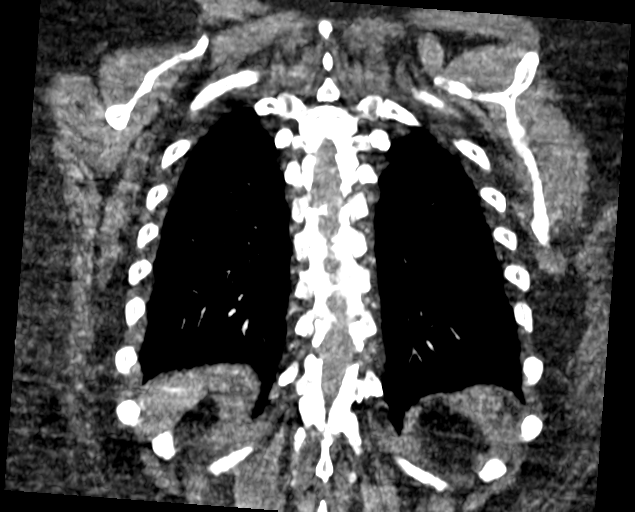

[13 of 23 positions shown; findings below may reference images not displayed]

DIAGNOSTIC STUDIES

EXAM

CT CHEST WITH CONTRAST

INDICATION

sob high d dimer; history of glioblastoma
PT C/O INCREASED SOA. HX OF BRAIN TUMOR. ZBB7L NNGDN9G. CT/NM 3/0. TJ

TECHNIQUE

Helical CT of the chest is performed after the dynamic administration of 100 cc Omnipaque 350.
Coronal and sagittal reconstruction images are obtained.

All CT scans at this facility use dose modulation, iterative reconstruction, and/or weight based
dosing when appropriate to reduce radiation dose to as low as reasonably achievable.

Number of previous computed tomography exams in the last 12 months is 3.

Number of previous nuclear medicine myocardial perfusion studies in the last 12 months is 0  .

COMPARISONS

None

FINDINGS

Study is slightly suboptimal for evaluation of intraluminal filling defects/pulmonary emboli
secondary to technique and body habitus. Intraluminal filling defects are present involving the
right main descending pulmonary artery and its branches (series 5, images 48-50, 52-53). Shotty
mediastinal and axillary nodes are present. No adenopathy pathologic by CT size criteria is
identified. No pericardial nor pleural effusions are identified. The aorta has a normal CT
appearance.

Dependent atelectasis is present. No focal consolidation is identified.

The cephalad aspects of the liver, spleen, pancreas, and kidneys demonstrate the visualized
portions of the structures to be unremarkable. Prior cholecystectomy changes are noted. Spondylitic
changes are present.

IMPRESSION

1. CT findings compatible pulmonary emboli.

2. Shotty nodes. Surgically absent gallbladder.

Three. Report was called at the time of dictation to the ED physician, Dr. Frank.

Tech Notes:

PT C/O INCREASED SOA. HX OF BRAIN TUMOR. ZBB7L NNGDN9G. CT/NM 3/0. TJ

## 2018-12-07 ENCOUNTER — Encounter: Admit: 2018-12-07 | Discharge: 2018-12-07 | Payer: Commercial Managed Care - PPO

## 2018-12-07 ENCOUNTER — Ambulatory Visit: Admit: 2018-12-07 | Discharge: 2018-12-08 | Payer: Commercial Managed Care - PPO

## 2018-12-07 DIAGNOSIS — I2699 Other pulmonary embolism without acute cor pulmonale: Principal | ICD-10-CM

## 2018-12-07 IMAGING — US US echo 2D, wo/w m-mode, compl
1 series · 14 of 16 positions shown · non-contrast
Comparison: none

[Series 1: us echo 2d, wo/w m-mode, compl · 27 acquisitions, 14 frames shown]
[im 1/27]
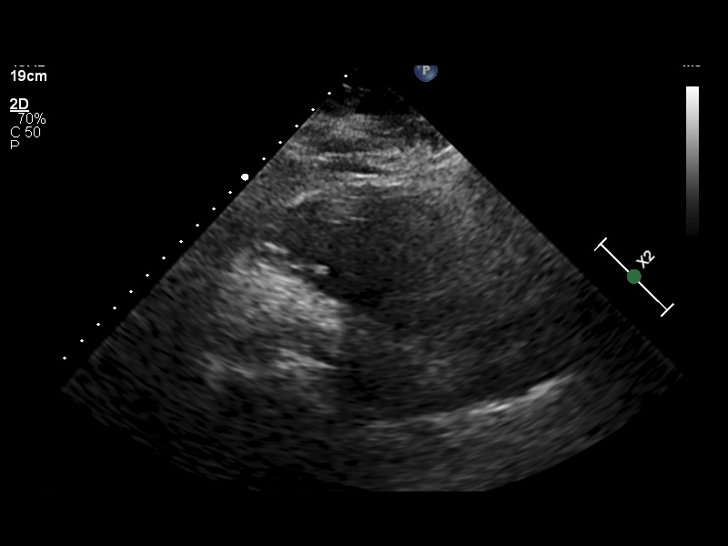
[im 2/27]
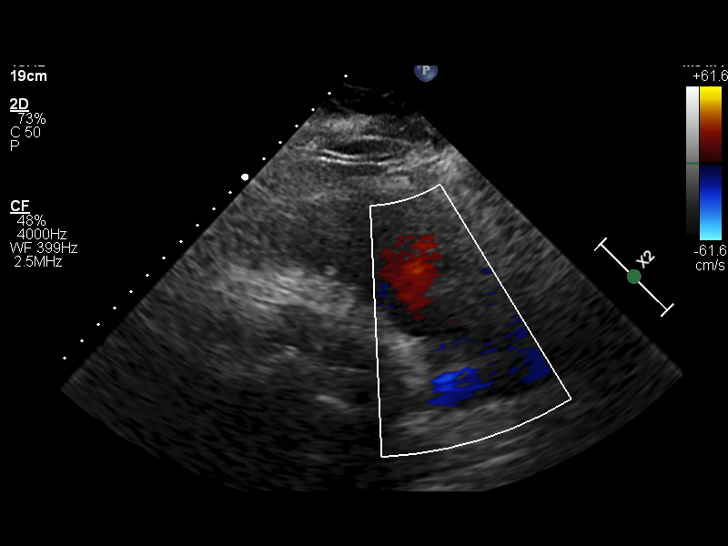
[im 4/27]
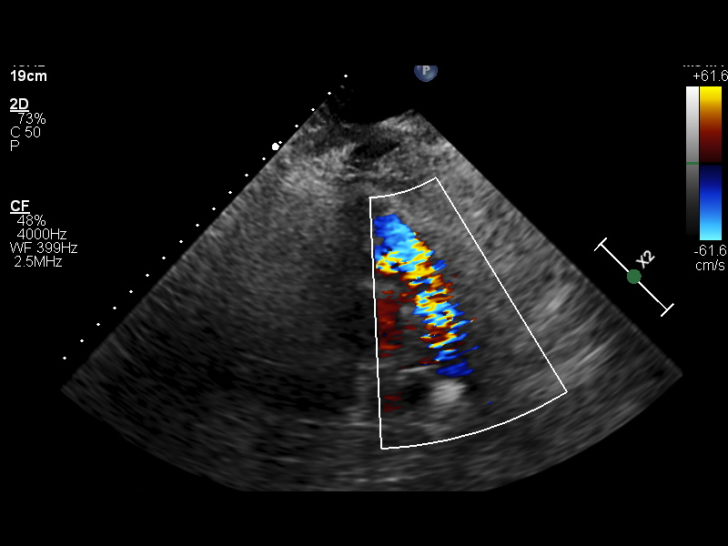
[im 7/27]
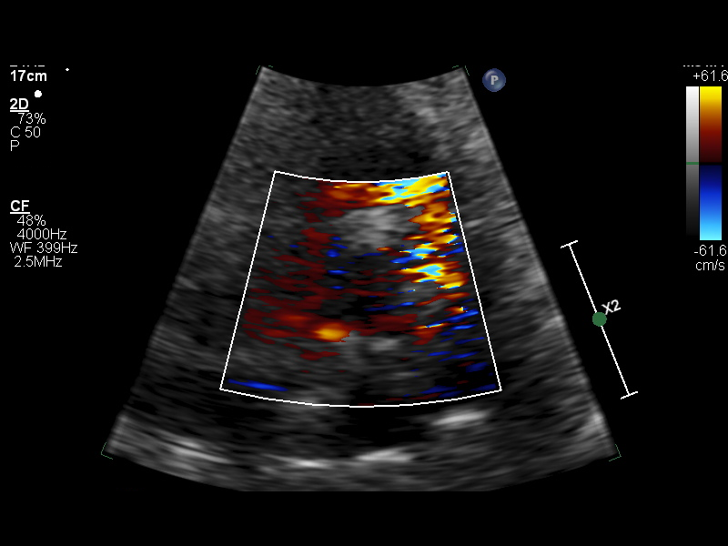
[im 9/27]
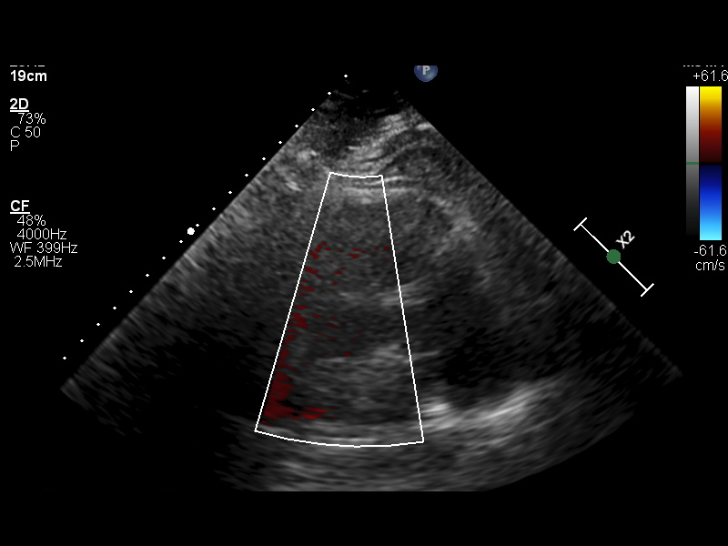
[im 11/27]
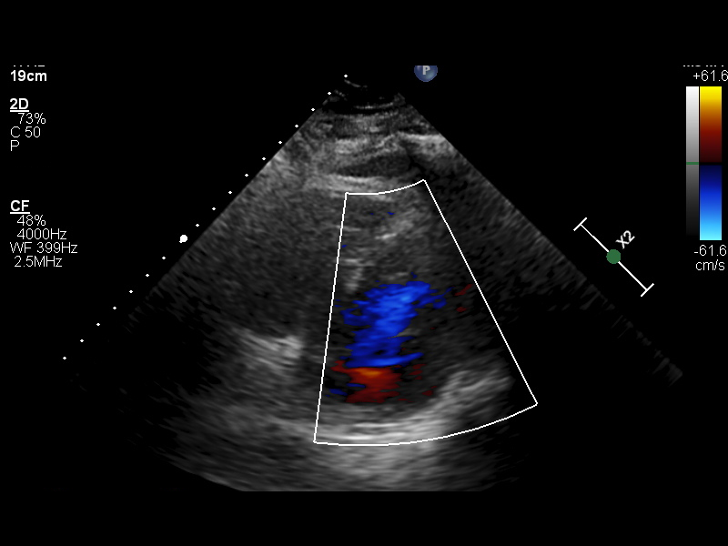
[im 13/27]
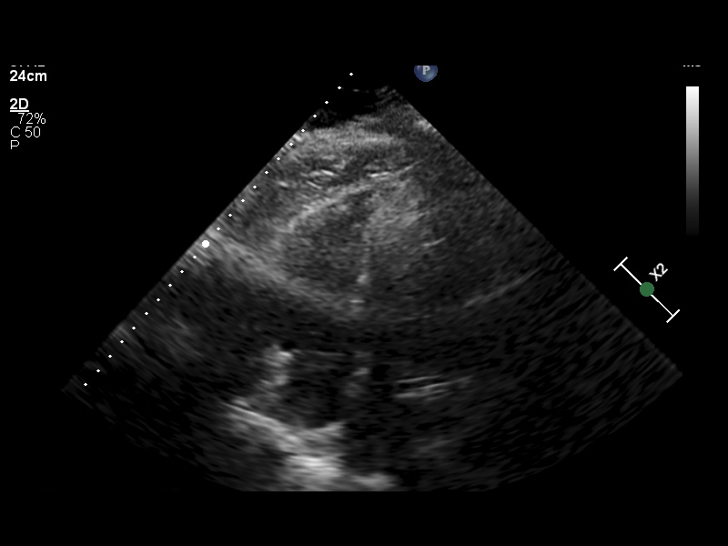
[im 14/27]
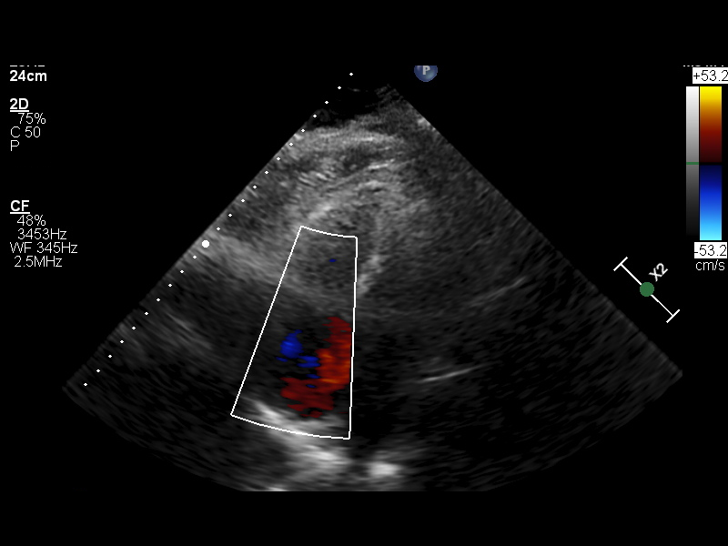
[im 16/27]
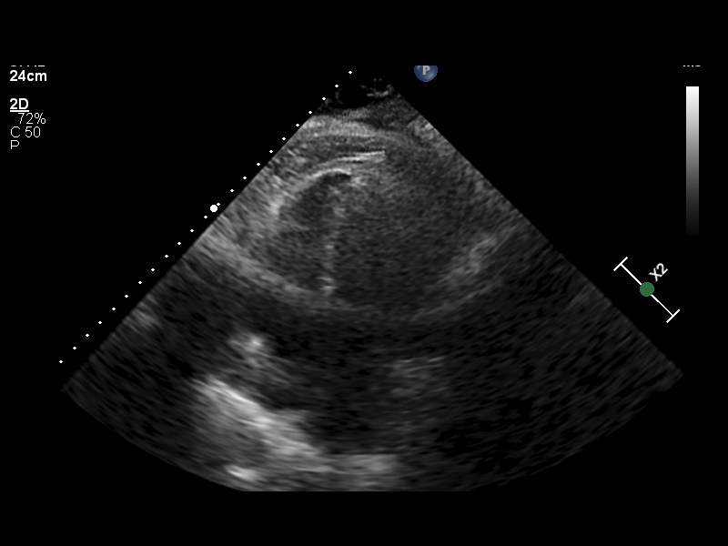
[im 18/27]
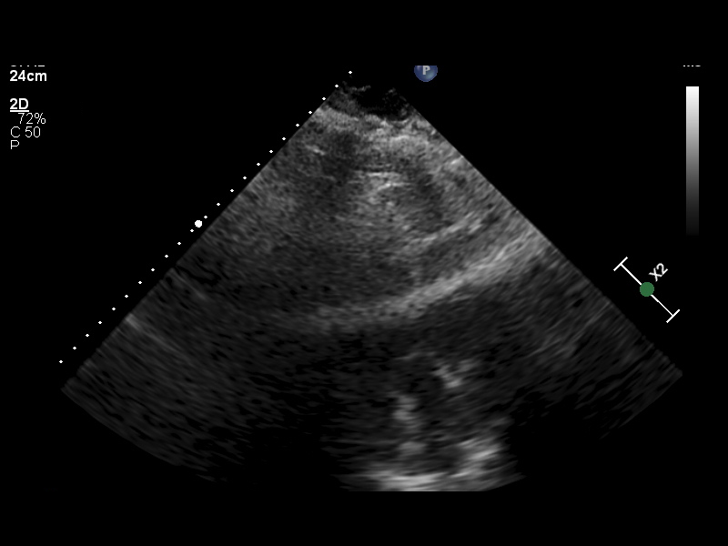
[im 21/27]
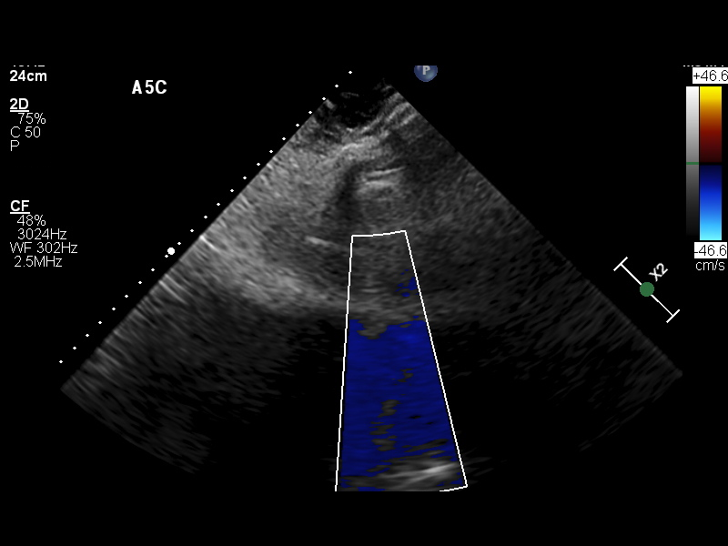
[im 23/27]
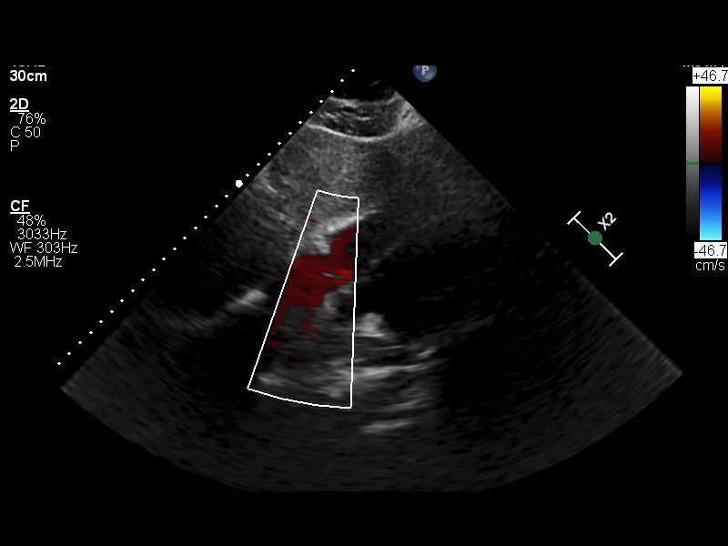
[im 25/27]
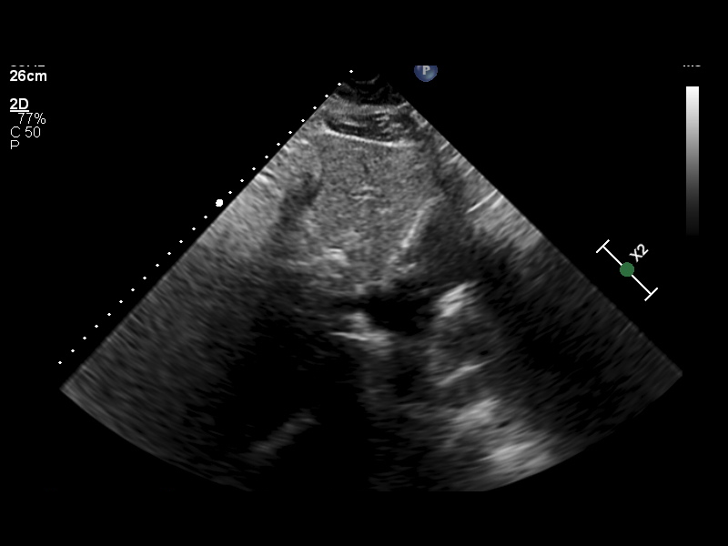
[im 27/27]
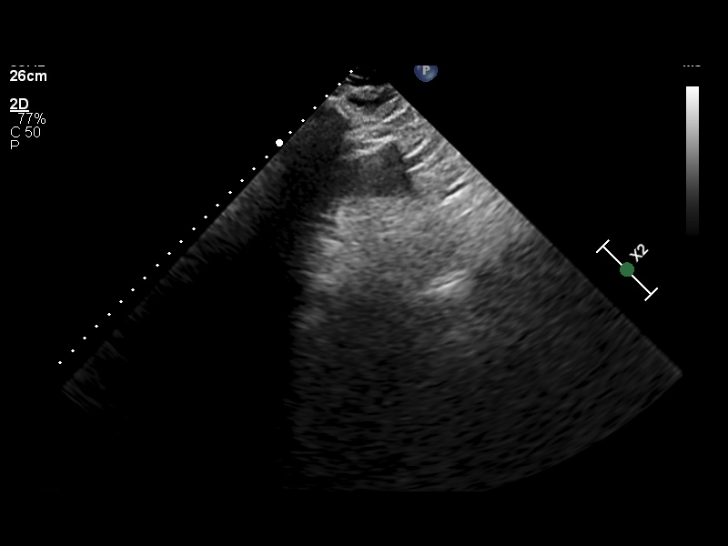

[14 of 16 positions shown; findings below may reference images not displayed]

FINAL REPORT IS SCANNED IN THE PATIENT'S EMR.

Tech Notes:

jl

## 2018-12-07 NOTE — Progress Notes
Initial Assessment: Oral Chemotherapy  Temozolomide (Temodar???)    Andrea [name] is a 48 y.o. female with a diagnosis of Glioblastoma, IDH-wildtype, WHO grade IV.      Indication/Regimen    Temozolomide (Temodar???) is being used appropriately for treatment of glioblastoma.      The dosing regimen of 150 mg/m2 (380 mg or one 180-mg capsule plus two 200-mg capsules) by mouth once daily on Days 1 through 5 of a 28-day cycle is appropriate for [name]. It is planned to continue until chemotherapy is complete.      Patient History:   Cancer Diagnosis: glioblastoma   Past treatment regimens:    Past Treatment Plans    SUPPORTIVE CARE   Plan Name Cycles Start Date Discontinue Date Discontinue Reason Discontinue User    OP SUPPORT PENTAMIDINE INH 1 of 12 cycles started 09/21/2018  11/18/2018 Therapy Complete Bartholome Bill, MD   ONCOLOGY 2   Plan Name Cycles Start Date Discontinue Date Discontinue Reason Discontinue User    OP BRAIN TEMOZOLOMIDE (ORAL) + XRT - CONCOMITANT PHASE Treatment not started 09/20/2018  12/06/2018 Therapy Complete Bartholome Bill, MD          Wt Readings from Last 1 Encounters:   11/16/18 (!) 139 kg (306 lb 6.4 oz)        Estimated body surface area is 2.56 meters squared as calculated from the following:    Height as of 11/16/18: 170.2 cm (67).    Weight as of 11/16/18: 139 kg (306 lb 6.4 oz).    Allergies:  Allergies   Allergen Reactions   ??? Morphine ANAPHYLAXIS   ??? Codeine RASH   ??? Erythromycin SHORTNESS OF BREATH   ??? Latex RASH   ??? Nsaids (Non-Steroidal Anti-Inflammatory Drug) SEE COMMENTS     History of GI bleed - has been instructed to avoid NSAID's       Baseline Labs:     CBC w/Diff    Lab Results   Component Value Date/Time    WBC 3.2 (L) 11/16/2018 11:08 AM    RBC 3.75 (L) 11/16/2018 11:08 AM    HGB 12.6 11/16/2018 11:08 AM    HCT 36.7 11/16/2018 11:08 AM    MCV 97.8 11/16/2018 11:08 AM    MCH 33.5 11/16/2018 11:08 AM    MCHC 34.2 11/16/2018 11:08 AM buPROPion XL (WELLBUTRIN XL) 150 mg tablet Take one tablet by mouth daily. Do not crush or chew.   Cephalexin 500 mg tab Take 1 tablet by mouth every 8 hours. Added per 12/01/18 records from [facility] pharmacy   cyclosporine (RESTASIS) 0.05 % ophthalmic emulsion Apply one drop to both eyes twice daily. Fill largest quantity covered  Indications: keratoconjunctivitis sicca, or dry and inflamed cornea and conjunctiva of the eye   dextroamphetamine-amphetamine(+) (ADDERALL) 30 mg tablet Take 30 mg by mouth twice daily   docusate (COLACE) 100 mg capsule Take 100-200 mg by mouth twice daily as needed for Constipation.   duloxetine DR (CYMBALTA) 60 mg capsule Take one capsule by mouth daily.   fluconazole (DIFLUCAN) 200 mg tablet Take 1 tablet daily for 7 days.   furosemide (LASIX) 40 mg tablet Take 40 mg by mouth daily as needed (leg swelling).   levothyroxine (SYNTHROID) 200 mcg tablet Take 200 mcg by mouth daily 30 minutes before breakfast. Takes + for a total of qday   levothyroxine (SYNTHROID) 50 mcg tablet Take 50 mcg by mouth daily 30 minutes before breakfast. Takes +  for a total of qday   losartan-hydrochlorothiazide (HYZAAR) 100-12.5 mg tablet Take 1 tablet by mouth every morning. Patient take 1/2 tablet daily.   meclizine (ANTIVERT) 25 mg tablet Added per 12/01/18 records from [facility] pharmacy   ondanset[facility]) 8 mg tablet Take 1-2 tabs, one hour before chemotherapy and every 6 hours as needed, but no more than 3 tabs in 24 hours.   pantoprazole DR (PROTONIX) 40 mg tablet Take 40 mg by mouth daily. Added per 10/30/18 records from [facility] pharmacy   polyethylene glycol 3350 (MIRALAX) 17 g packet Take one packet by mouth daily.   senna/docusate (SENOKOT-S) 8.6/50 mg tablet Take 1-2 tablets by mouth daily as needed (for constipation).   traMADol (ULTRAM) 50 mg tablet Take 1 tablet by mouth twice daily as needed for Pain PO. Added per 11/16/18 records from [facility] pharmacy vitamins, multiple cap Take 1 capsule by mouth daily.       Medication reconciliation is based on the patient???s most recent medication list in the electronic medical record (EMR) including herbal products and OTC medications. The patient's medication list will be updated during patient education, after speaking with the patient and prior to dispensing the medication.     Drug-drug interactions (DDIs)    DDIs were evaluated: No significant drug-drug interactions were identified with Temodar.     Follow up plan: will discuss with Patient and determine if alternative therapy is appropriate.      Drug-Food Interactions    Drug-food interactions were evaluated.  Doses should be taken consistently with or without food.    Contraindications    The following contraindications to temozolomide were reviewed:  ??? Hypersensitivity (eg, allergic reaction, anaphylaxis, urticaria, Stevens-Johnson syndrome, toxic epidermal necrolysis) to temozolomide or any component of the formulation  ??? Hypersensitivity to dacarbazine (both drugs are metabolized to MTIC)    Vaccination Status Assessment     Immunization History   Administered Date(s) Administered   ??? DTaP vaccine IM (Infanrix) 07/31/1970, 02/21/1971, 03/18/1971, 03/16/1972   ??? Flu Vaccine =>6 Months Quadrivalent PF 09/04/2018   ??? Flu vaccine, inj unspecified (Historical) 09/04/2018   ??? HEPATITIS B vaccine, unspecified (Historical) 09/15/1989, 10/29/1989, 11/26/1990, 06/11/2004   ??? MMR Vaccine 05/01/1971, 05/13/1975, 12/25/2004   ??? OPV 07/31/1970, 02/21/1971, 02/22/1972, 01/01/1983   ??? Td vaccine, unspecified (Historical) 02/05/1992, 06/05/2003   ??? Tdap Vaccine 04/23/2017       Vaccine history was reviewed. The patient will be reminded about the importance of receiving an annual influenza vaccine as indicated.    Safety Precautions    The following safety precautions to the use of temozolomide were reviewed:  ??? Bone marrow suppression: Prior to dosing, ANC should be ?1,500/mm3 and platelets ?100,000/mm3  ??? Gastrointestinal toxicity: antiemetics are recommended  ??? Hepatotoxicity  ??? Pneumocystis jirovecii pneumonia (PCP) may occur; risk is increased in those receiving steroids or longer dosing regimens.    Safety precautions for this medication have been reviewed. No concerns have been identified. See outside labs for most recent blood counts.      Risk Evaluation and Mitigation Strategy (REMS) Assessment    No REMS is required for this medication.     Initial therapy assessment has been completed and the patient will be contacted to complete education on their regimen.     Brent Bulla, [facility]  Clinical Pharmacist  12/07/18

## 2018-12-14 ENCOUNTER — Encounter: Admit: 2018-12-14 | Discharge: 2018-12-14 | Payer: Commercial Managed Care - PPO

## 2018-12-15 ENCOUNTER — Encounter: Admit: 2018-12-15 | Discharge: 2018-12-15 | Payer: Commercial Managed Care - PPO

## 2018-12-15 DIAGNOSIS — I10 Essential (primary) hypertension: Principal | ICD-10-CM

## 2018-12-15 DIAGNOSIS — E079 Disorder of thyroid, unspecified: ICD-10-CM

## 2018-12-15 DIAGNOSIS — F329 Major depressive disorder, single episode, unspecified: ICD-10-CM

## 2018-12-15 DIAGNOSIS — F909 Attention-deficit hyperactivity disorder, unspecified type: ICD-10-CM

## 2018-12-15 DIAGNOSIS — C719 Malignant neoplasm of brain, unspecified: Principal | ICD-10-CM

## 2018-12-15 MED ORDER — TEMOZOLOMIDE 180 MG PO CAP
180 mg | ORAL_CAPSULE | Freq: Every evening | ORAL | 0 refills | Status: AC
Start: 2018-12-15 — End: 2019-01-26

## 2018-12-15 MED ORDER — ONDANSETRON HCL 8 MG PO TAB
ORAL_TABLET | Freq: Four times a day (QID) | ORAL | 11 refills | 8.00000 days | Status: AC
Start: 2018-12-15 — End: 2019-05-12

## 2018-12-15 MED ORDER — TEMOZOLOMIDE 100 MG PO CAP
200 mg | ORAL_CAPSULE | Freq: Every evening | ORAL | 0 refills | Status: AC
Start: 2018-12-15 — End: 2019-01-26

## 2018-12-15 MED ORDER — IMS MIXTURE TEMPLATE
150 mg/m2 | Freq: Every evening | ORAL | 0 refills | Status: CN
Start: 2018-12-15 — End: ?

## 2018-12-16 ENCOUNTER — Encounter: Admit: 2018-12-16 | Discharge: 2018-12-16 | Payer: Commercial Managed Care - PPO

## 2018-12-16 NOTE — Telephone Encounter
Navigation Intake Assessment Document    Patient Name:  Andrea [name]  DOB:  10/21/1971  Insurance:   Rosann Auerbach    Appointment Info:   Future Appointments   Date Time Provider Department Center   12/17/2018 12:15 PM Sheral Apley, MD [facility] NeuroSurg   12/23/2018  9:30 AM Omer Jack, MD [facility] [place] Ex[facility]   01/19/2019 11:00 AM Alt, Ottis Stain, PhD [facility] [place] Exam       Diagnosis & Reason for Visit:  PE-anticoagulation recommendations.  Patient has a PMH significant for glioblastoma with resection 08/27/2018.  She is an established patient of Dr Milas Kocher.  Patient had presented multiple times to the [facility] ED in early February with complaints of not feeling right.  D-dimer performed on an unknown date was 43.  Patient was instructed to return to the ED and a CT chest was performed 12/06/18 and revealed pulmonary emboli.  Patient was discharged from the hospital 12/09/18 on lovenox.  Labs also revealed anemia with a hemoglobin of 7.4 on 12/08/18.  Per patient she had a hematology consult while admitted to the hospital.  She was not discharged taking any kind of iron supplementation.  She was referred to [facility] hematology for further management of her anemia and PE.  Additional records are in [facility] and Care Everywhere.      Physician Info:   ??? Referring Physician:  [facility]   ??? PCP:  Ozzie Hoyle

## 2018-12-17 ENCOUNTER — Ambulatory Visit: Admit: 2018-12-17 | Discharge: 2018-12-18 | Payer: Commercial Managed Care - PPO

## 2018-12-17 ENCOUNTER — Encounter: Admit: 2018-12-17 | Discharge: 2018-12-17 | Payer: Commercial Managed Care - PPO

## 2018-12-17 DIAGNOSIS — C719 Malignant neoplasm of brain, unspecified: Principal | ICD-10-CM

## 2018-12-17 DIAGNOSIS — E079 Disorder of thyroid, unspecified: ICD-10-CM

## 2018-12-17 DIAGNOSIS — F909 Attention-deficit hyperactivity disorder, unspecified type: ICD-10-CM

## 2018-12-17 DIAGNOSIS — F329 Major depressive disorder, single episode, unspecified: ICD-10-CM

## 2018-12-17 DIAGNOSIS — I10 Essential (primary) hypertension: Principal | ICD-10-CM

## 2018-12-17 NOTE — Progress Notes
AND PELVIS: No discrete primary malignancy or abdominopelvic metastatic disease. Probable left ovarian cyst, incompletely characterized. Small fat containing supraumbilical hernia.  ???  08/27/18: Right craniotomy -??????Path:???High grade glioma with necrosis, mitoses and mvp.IDH1 negative by ihc, MGMT pending  ???  08/28/18:???MRI Head - Interval right parietal craniotomy and mass resection, with expected hemorrhage within the operative cavity. There is mild postoperative pneumocephalus and a relatively small amount of postoperative gas, fluid, and blood products deep to the craniotomy. Residual peripheral enhancement along the anterior inferior origin of the resection bed, most compatible with residual tumor   Improvement in right cerebral mass effect with improving leftward shift and sulcal effacement, with resolved or nearly uncal herniation. No significant change in FLAIR signal abnormality within the right posterior cerebral hemisphere and along the bilateral rostral callosum  ???  08/31/18: Admitted to inpatient rehab at [facility]???with tentative DC date 09/15/18  ???     ??? Impaired mobility and ADLs 08/31/2018   ??? Impaired transfers 08/31/2018   ??? Impaired cognition 08/31/2018   ??? HTN (hypertension) 08/31/2018   ??? Hypothyroid 08/31/2018   ??? Depression 08/31/2018   ??? Confusion 08/26/2018           08/27/18: Right craniotomy for resection of tumor.  Pathology: GBM    48 year old female who returns to clinic today due to concerns about her wound.  She has completed radiation therapy and has gotten 1 round of chemotherapy for her GBM.  She unfortunately also developed a PE and now is on therapeutic Lovenox.  She is currently at [facility], but is discharging today and returning home.    In terms of her wound, she has noticed some intermittent swelling.  She denies any fevers.  She denies any redness of the wound.  She denies any drainage from the wound.    Awake, alert  Left-sided homonymous hemianopsia  5 out of 5 strength x4

## 2018-12-23 ENCOUNTER — Encounter: Admit: 2018-12-23 | Discharge: 2018-12-23 | Payer: Commercial Managed Care - PPO

## 2018-12-23 DIAGNOSIS — Z9221 Personal history of antineoplastic chemotherapy: ICD-10-CM

## 2018-12-23 DIAGNOSIS — E079 Disorder of thyroid, unspecified: ICD-10-CM

## 2018-12-23 DIAGNOSIS — D649 Anemia, unspecified: ICD-10-CM

## 2018-12-23 DIAGNOSIS — I10 Essential (primary) hypertension: Principal | ICD-10-CM

## 2018-12-23 DIAGNOSIS — R1011 Right upper quadrant pain: ICD-10-CM

## 2018-12-23 DIAGNOSIS — Z923 Personal history of irradiation: ICD-10-CM

## 2018-12-23 DIAGNOSIS — F909 Attention-deficit hyperactivity disorder, unspecified type: ICD-10-CM

## 2018-12-23 DIAGNOSIS — D6859 Other primary thrombophilia: Principal | ICD-10-CM

## 2018-12-23 DIAGNOSIS — F329 Major depressive disorder, single episode, unspecified: ICD-10-CM

## 2018-12-23 DIAGNOSIS — R509 Fever, unspecified: ICD-10-CM

## 2018-12-23 DIAGNOSIS — Z87891 Personal history of nicotine dependence: ICD-10-CM

## 2018-12-23 DIAGNOSIS — C719 Malignant neoplasm of brain, unspecified: ICD-10-CM

## 2018-12-23 LAB — RETICULOCYTE COUNT
Lab: 115 10*3/uL — ABNORMAL HIGH (ref 30–94)
Lab: 2.8 % (ref 7–56)
Lab: 3.6 % — ABNORMAL HIGH (ref 0.5–2.0)

## 2018-12-23 LAB — URINALYSIS DIPSTICK REFLEX TO CULTURE

## 2018-12-23 LAB — URINALYSIS MICROSCOPIC REFLEX TO CULTURE

## 2018-12-23 LAB — CBC AND DIFF
Lab: 102 FL — ABNORMAL HIGH (ref 80–100)
Lab: 11 g/dL — ABNORMAL LOW (ref 12.0–15.0)
Lab: 20 % — ABNORMAL HIGH (ref 11–15)
Lab: 3.2 M/UL — ABNORMAL LOW (ref 4.0–5.0)
Lab: 33 % — ABNORMAL LOW (ref 36–45)
Lab: 33 g/dL (ref 32.0–36.0)
Lab: 34 pg — ABNORMAL HIGH (ref 26–34)
Lab: 6 10*3/uL — AB (ref 4.5–11.0)
Lab: 6.9 FL — ABNORMAL LOW (ref 7–11)
Lab: 73 % (ref 41–77)

## 2018-12-23 LAB — HAPTOGLOBIN: Lab: 258 mg/dL — ABNORMAL HIGH (ref 16–200)

## 2018-12-23 LAB — COMPREHENSIVE METABOLIC PANEL
Lab: 140 MMOL/L (ref 137–147)
Lab: 3.3 MMOL/L — ABNORMAL LOW (ref 3.5–5.1)

## 2018-12-23 LAB — FOLATE, SERUM: Lab: >22 ng/mL (ref >3.9)

## 2018-12-23 LAB — VITAMIN B12: Lab: 103 pg/mL — ABNORMAL HIGH (ref 180–914)

## 2018-12-23 LAB — IRON + BINDING CAPACITY + %SAT+ FERRITIN
Lab: 209 ng/mL — ABNORMAL HIGH (ref 10–200)
Lab: 299 ug/dL (ref 270–380)
Lab: 65 ug/dL — ABNORMAL LOW (ref 50–160)

## 2018-12-23 MED ORDER — APIXABAN 5 MG PO TAB
5 mg | ORAL_TABLET | Freq: Two times a day (BID) | ORAL | 11 refills | Status: AC
Start: 2018-12-23 — End: 2019-12-05

## 2018-12-23 NOTE — Progress Notes
Family history: Father apparently had factor V Leiden.  No history of thromboembolism and other family members.    Social history: Former smoker, infrequent alcohol       Review of Systems   Constitutional: Positive for fatigue and fever.   HENT: Positive for postnasal drip.    Eyes: Positive for visual disturbance (Left visual field defect).   Respiratory: Positive for shortness of breath.    Cardiovascular: Negative.    Gastrointestinal: Positive for constipation and nausea.   Genitourinary: Negative.    Musculoskeletal: Negative.    Skin: Negative.    Neurological: Positive for dizziness, tremors and light-headedness.   Hematological: Bruises/bleeds easily.   Psychiatric/Behavioral: Negative.          Objective:         ??? acetaminophen (TYLENOL) 325 mg tablet Take two tablets by mouth every 6 hours as needed for Pain.   ??? ALPRAZolam (XANAX) 0.5 mg tablet Take 1 tablet by mouth three times daily as needed for Anxiety PO.   ??? buPROPion XL (WELLBUTRIN XL) 150 mg tablet Take one tablet by mouth daily. Do not crush or chew.   ??? calcium carbonate (TUMS) 500 mg (200 mg elemental calcium) chewable tablet Chew 500 mg by mouth as Needed.   ??? cyclosporine (RESTASIS) 0.05 % ophthalmic emulsion Apply one drop to both eyes twice daily. Fill largest quantity covered  Indications: keratoconjunctivitis sicca, or dry and inflamed cornea and conjunctiva of the eye   ??? docusate (COLACE) 100 mg capsule Take 100-200 mg by mouth twice daily as needed for Constipation.   ??? duloxetine DR (CYMBALTA) 60 mg capsule Take one capsule by mouth daily.   ??? enoxaparin (LOVENOX) 150 mg/mL syringe Inject 1 mg/kg under the skin every 12 hours.   ??? ferrous sulfate (FEOSOL) 325 mg (65 mg iron) tablet Take 325 mg by mouth twice daily with meals.   ??? furosemide (LASIX) 40 mg tablet Take 40 mg by mouth daily as needed (leg swelling).   ??? levothyroxine (SYNTHROID) 200 mcg tablet Take 200 mcg by mouth daily 30 Absolute Basophil Count Latest Ref Range: 0 - 0.20 K/UL 0.00   Basophils Latest Ref Range: 0 - 2 % 1   RBC Latest Ref Range: 4.0 - 5.0 M/UL 3.28 (L)   MCV Latest Ref Range: 80 - 100 FL 102.3 (H)   MCH Latest Ref Range: 26 - 34 PG 34.4 (H)   MCHC Latest Ref Range: 32.0 - 36.0 G/DL 16.1   MPV Latest Ref Range: 7 - 11 FL 6.9 (L)   RDW Latest Ref Range: 11 - 15 % 20.4 (H)   Retic, Uncorrected Latest Ref Range: 0.5 - 2.0 % 3.6 (H)   Retic, Corrected Latest Units: % 2.8   Retic, Absolute Latest Ref Range: 30 - 94 K/UL 115.0 (H)   Serum Folate Latest Ref Range: >3.9 NG/ML >22.3   Vitamin B12 Latest Ref Range: 180 - 914 PG/ML 1,039 (H)   Iron Latest Ref Range: 50 - 160 MCG/DL 65   % Saturation Latest Ref Range: 28 - 42 % 22 (L)   Iron Binding-TIBC Latest Ref Range: 270 - 380 MCG/DL 096   Ferritin Latest Ref Range: 10 - 200 NG/ML 209 (H)   Haptoglobin Latest Ref Range: 16 - 200 MG/DL 045 (H)     Assessment and Plan:  Hypercoagulable state: Ideal treatment with brain tumor would be Lovenox.  However for cost and convenience reasons, it would be reasonable to switch her over to  1 of the NOACs.  Studies have shown some superiority over conventional anticoagulation in hypercoagulability due to malignancy.  Risk of intracranial bleeding should be fairly low given that she has had resection and radiation.  Patient may be at risk of losing her insurance.  I did provide her voucher for medication assistance.  I also strongly advised her to meet with our Statistician.  I would anticipate that she will need indefinite anticoagulation.    Anemia: Repeat labs have shown dramatic improvement consistent with suppression of counts due to recent chemoradiation and subsequent recovery.    Fevers: Not documented in the clinic.  Also recently evaluated during admission without any obvious infection.  Would observe for now.    GBM: Being managed by neuro oncology.    Patient will follow-up with me in 3 months.

## 2018-12-27 ENCOUNTER — Encounter: Admit: 2018-12-27 | Discharge: 2018-12-27 | Payer: Commercial Managed Care - PPO

## 2018-12-29 ENCOUNTER — Encounter: Admit: 2018-12-29 | Discharge: 2018-12-29 | Payer: Commercial Managed Care - PPO

## 2018-12-29 MED ORDER — GRANISETRON HCL 1 MG PO TAB
1 mg | ORAL_TABLET | Freq: Two times a day (BID) | ORAL | 11 refills | 30.00000 days | Status: AC | PRN
Start: 2018-12-29 — End: ?

## 2018-12-29 NOTE — Telephone Encounter
CR 0.74 12/23/2018 10:03 AM    GLU 99 12/23/2018 10:03 AM    Lab Results   Component Value Date/Time    CA 9.5 12/23/2018 10:03 AM    PO4 3.0 08/26/2018 02:44 PM    ALBUMIN 3.7 12/23/2018 10:03 AM    TOTPROT 7.2 12/23/2018 10:03 AM    ALKPHOS 34 12/23/2018 10:03 AM    AST 34 12/23/2018 10:03 AM    ALT 39 12/23/2018 10:03 AM    TOTBILI 0.4 12/23/2018 10:03 AM    GFR >60 12/23/2018 10:03 AM    GFRAA >60 12/23/2018 10:03 AM              Serum creatinine: 0.74 mg/dL 19/14/78 2956  Estimated creatinine clearance: 135.5 mL/min      Adverse Effects Assessment    Andrea [name] is having the following adverse effects: see above, dizziness, nausea, dry heaves, lack of energy, and light-headedness.   None of the adverse effects were severe enough to interfere with adherence.     Interaction Check    Andrea [name] reports the following medication changes since the last medication history during their education visit: none    Drug-drug and drug-food interactions between the patients??? specialty medication and their medication list were assessed.     No significant drug-drug interactions were identified.     The patient was instructed to speak with their health care provider and/or the oral chemotherapy pharmacist before starting any new drug, including prescription or over the counter, natural / herbal products, or vitamins.     Risk Evaluation and Mitigation Strategy (REMS) Assessment    No REMS is required for this medication.    Followup Plan     The patient was encouraged to call the oral chemotherapy pharmacist at [phone] with questions.   Re-assessment has been completed. Next reassessment planned for 2 week(s), to assess whether treatment will continue.    Ramond Craver, [facility]  Oncology Clinical Pharmacist  12/29/2018

## 2019-01-12 ENCOUNTER — Encounter: Admit: 2019-01-12 | Discharge: 2019-01-13 | Payer: Commercial Managed Care - PPO

## 2019-01-14 LAB — COMPREHENSIVE METABOLIC PANEL

## 2019-01-14 LAB — CBC AND DIFF

## 2019-01-17 ENCOUNTER — Ambulatory Visit: Admit: 2019-01-17 | Discharge: 2019-01-18 | Payer: Commercial Managed Care - PPO

## 2019-01-17 ENCOUNTER — Encounter: Admit: 2019-01-17 | Discharge: 2019-01-17 | Payer: Commercial Managed Care - PPO

## 2019-01-17 DIAGNOSIS — F329 Major depressive disorder, single episode, unspecified: ICD-10-CM

## 2019-01-17 DIAGNOSIS — I10 Essential (primary) hypertension: Principal | ICD-10-CM

## 2019-01-17 DIAGNOSIS — E079 Disorder of thyroid, unspecified: ICD-10-CM

## 2019-01-17 DIAGNOSIS — F909 Attention-deficit hyperactivity disorder, unspecified type: ICD-10-CM

## 2019-01-17 MED ORDER — SODIUM CHLORIDE 0.9 % IV SOLP
1000 mL | Freq: Once | INTRAVENOUS | 0 refills | 30.00000 days | Status: AC
Start: 2019-01-17 — End: ?

## 2019-01-17 MED ORDER — PREDNISONE 10 MG PO TAB
10 mg | ORAL_TABLET | Freq: Every day | ORAL | 0 refills | Status: AC
Start: 2019-01-17 — End: 2019-01-21

## 2019-01-17 MED ORDER — SODIUM CHLORIDE 0.9 % IV SOLP
1000 mL | INTRAVENOUS | 0 refills | Status: DC
Start: 2019-01-17 — End: 2019-01-17

## 2019-01-17 NOTE — Progress Notes
[facility]  Brain and Spine Tumor Program        Neuro-oncology Clinic Progress Note    Patient Name: Andrea [name]  DOB: 05-Jan-1971  MRN: [id number]  DOS: 01/17/2019      Subjective:   Andrea [name] is a 48 y.o. 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    Reason For Visit:  Neuro-Oncology Care    Treatment History:  08/26/18: Presented to the ED after becoming lost while driving to her daughter's house which is 3 minutes from her home. She reports a headache for about 2 weeks which became gradually worse and she attributed to sinus problems. She states she has felt foggy lately, fatigued and her memory has been poor. Her family reports she has been running into things around the house, she drove on the wrong side of the road, forgot to go to work, and has been jumbling her words and is confused.  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 A CT head showed a large right posterior cerebrum lesion and she was transferred to [facility] for a higher level of care.  ???  08/26/18: CT Head - Large masslike process throughout the right posterior cerebrum with probably associated hemorrhage. Vasogenic edema with diffuse sulcal effacement and subfalcine herniation. ???Suggestive diffuse thickening and increased attenuation throughout the right cerebral cortex/inter up. ???  ???  08/26/18: MRI Head - ???Large cerebral necrotic enhancing mass centered within the right posterior cerebral hemisphere, favored for high-grade glioma. Cystic metastasis is considered less likely. Localized mass effect results in right hemispheric sulcal effacement, 7 mm leftward shift, and early right uncal herniation. Moderate FLAIR hyperintensity surrounding the mass and involving the bilateral rostral callosum, which may represent a combination of vasogenic edema and/or tumor elements.  ??? daily as needed for Anxiety PO.   ??? apixaban (ELIQUIS) 5 mg tablet Take one tablet by mouth twice daily. Indications: a clot in the lung   ??? buPROPion XL (WELLBUTRIN XL) 150 mg tablet Take one tablet by mouth daily. Do not crush or chew.   ??? calcium carbonate (TUMS) 500 mg (200 mg elemental calcium) chewable tablet Chew 500 mg by mouth as Needed.   ??? cyclosporine (RESTASIS) 0.05 % ophthalmic emulsion Apply one drop to both eyes twice daily. Fill largest quantity covered  Indications: keratoconjunctivitis sicca, or dry and inflamed cornea and conjunctiva of the eye   ??? docusate (COLACE) 100 mg capsule Take 100-200 mg by mouth twice daily as needed for Constipation.   ??? duloxetine DR (CYMBALTA) 60 mg capsule Take one capsule by mouth daily.   ??? enoxaparin (LOVENOX) 150 mg/mL syringe Inject 1 mg/kg under the skin every 12 hours.   ??? ferrous sulfate (FEOSOL) 325 mg (65 mg iron) tablet Take 325 mg by mouth daily.   ??? furosemide (LASIX) 40 mg tablet Take 40 mg by mouth daily as needed (leg swelling).   ??? granisetron (KYTRIL) 1 mg tablet Take one tablet by mouth every 12 hours as needed for Nausea.   ??? levothyroxine (SYNTHROID) 200 mcg tablet Take 200 mcg by mouth daily 30 minutes before breakfast. Takes + for a total of qday   ??? levothyroxine (SYNTHROID) 50 mcg tablet Take 50 mcg by mouth daily 30 minutes before breakfast. Takes + for a total of qday   ??? losartan-hydrochlorothiazide (HYZAAR) 100-12.5 mg  tablet Take 1 tablet by mouth every morning. Patient take 1/2 tablet daily.   ??? ondansetron (ZOFRAN) 8 mg tablet Take 1-2 tabs, one hour before chemotherapy and every 6 hours as needed, but no more than 3 tabs in 24 hours.   ??? polyethylene glycol 3350 (MIRALAX) 17 g packet Take one packet by mouth daily. (Patient taking differently: Take 17 g by mouth as Needed.)   ??? predniSONE (DELTASONE) 10 mg tablet Take one tablet by mouth daily with breakfast. the normal range.  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  With her anorexia/nausea combined with diarrhea, I think volume loss is more likely.  Will start low dose of corticosteroids and refer her to PCP for fluid hydration.  To call us in two days with update, further recommendations to follow at that point..  We discussed switching chemo when she is recovered to BID dosed temozolomide.  She will be due for repeat MRI scan in about 8 weeks as well.      I spent a total of 30 minutes today in direct patient evaluation, from 1000 to 1030, with more than 20 minutes spent in treatment plan formulation, symptom management discussion and consultation as outlined above.         Bartholome Bill, MD  Director, Brain and Spine Tumor Program   [facility]  p. [phone]    f. [phone]

## 2019-01-18 ENCOUNTER — Encounter: Admit: 2019-01-18 | Discharge: 2019-01-18 | Payer: Commercial Managed Care - PPO

## 2019-01-18 DIAGNOSIS — C719 Malignant neoplasm of brain, unspecified: Principal | ICD-10-CM

## 2019-01-20 ENCOUNTER — Encounter: Admit: 2019-01-20 | Discharge: 2019-01-20 | Payer: Commercial Managed Care - PPO

## 2019-01-21 ENCOUNTER — Encounter: Admit: 2019-01-21 | Discharge: 2019-01-21 | Payer: Commercial Managed Care - PPO

## 2019-01-21 MED ORDER — CELECOXIB 200 MG PO CAP
200 mg | ORAL_CAPSULE | Freq: Two times a day (BID) | ORAL | 1 refills | Status: AC
Start: 2019-01-21 — End: 2019-04-26

## 2019-01-21 MED ORDER — PREDNISONE 20 MG PO TAB
20 mg | ORAL_TABLET | Freq: Every day | ORAL | 1 refills | Status: AC
Start: 2019-01-21 — End: 2019-05-12

## 2019-01-21 NOTE — Telephone Encounter
Left VM with pt regarding change in medication. Increasing Prednisone to 20mg /day beginning 01/21/19 and starting Celebrex 200mg  BID. Educated pt to contact office Monday 01/24/19 with update on symptoms or sooner if needed or if questions on medication change

## 2019-01-24 ENCOUNTER — Encounter: Admit: 2019-01-24 | Discharge: 2019-01-24 | Payer: Commercial Managed Care - PPO

## 2019-01-25 ENCOUNTER — Encounter: Admit: 2019-01-25 | Discharge: 2019-01-25 | Payer: Commercial Managed Care - PPO

## 2019-01-25 DIAGNOSIS — C719 Malignant neoplasm of brain, unspecified: Principal | ICD-10-CM

## 2019-01-25 MED ORDER — TEMOZOLOMIDE 20 MG PO CAP
20 mg | ORAL_CAPSULE | Freq: Two times a day (BID) | ORAL | 0 refills | Status: AC
Start: 2019-01-25 — End: 2019-01-28

## 2019-01-25 MED ORDER — TEMOZOLOMIDE 5 MG PO CAP
5 mg | ORAL_CAPSULE | Freq: Two times a day (BID) | ORAL | 0 refills | Status: AC
Start: 2019-01-25 — End: 2019-01-28

## 2019-01-25 MED ORDER — IMS MIXTURE TEMPLATE
10 mg/m2 | Freq: Two times a day (BID) | ORAL | 0 refills | Status: CN
Start: 2019-01-25 — End: ?

## 2019-01-26 ENCOUNTER — Encounter: Admit: 2019-01-26 | Discharge: 2019-01-26 | Payer: Commercial Managed Care - PPO

## 2019-01-26 DIAGNOSIS — C719 Malignant neoplasm of brain, unspecified: Principal | ICD-10-CM

## 2019-01-26 NOTE — Telephone Encounter
Standing CBC and CMP routed to [facility] at [phone] via EpicFax

## 2019-01-26 NOTE — Telephone Encounter
Oral Chemotherapy Counseling  Temozolomide (Temodar???)    Andrea [name] was provided medication education regarding her new oral chemotherapy.     I reviewed the role of [facility], including access to medication assistance specialists if needed.     How to take the medication:  Andrea [name] was educated on temozolomide (Temodar???) for glioblastoma; the indication for treatment, dose, route, frequency and duration of therapy were reviewed.     Directions:  10 mg/m2 (25 mg or one 20-mg capsule plus one 5-mg capsule) by mouth consistently with or without food twice daily until progression or unacceptable toxicity.    Patient was educated to swallow capsules whole and not to crush, chew or open capsules. May administer at bedtime to reduce nausea unless the provider would like the medication to be given prior to radiation.     How to Store Medication:  Andrea [name] was educated to store temozolomide at room temperature in a safe place away from humidity, pets, and children.  I instructed the patient that it was okay to store temozolomide in a pill box, if needed.  I recommended if family members would be handling the medication, they should use gloves.  Additionally, I recommended cleaning any surfaces touched by temozolomide with bleach, if possible.     Adherence:  Patient was educated on the importance of adherence and that the consequences of non-adherence could include disease progression. The patient's ability to be adherent with drug therapies was discussed and the patient was provided options for tools/resources that promote adherence to therapy. For [employer], calendars, pillboxes, and technology (reminder apps) were recommended and/or provided.     How to Manage Missed Doses:  I instructed the patient that if a dose is missed, she should take it as soon as she remembers on the same day and to return to normally scheduled doses the following day; however, not to take extra capsules to make up for the missed dose.  If capsules are vomited up, I recommended not taking additional doses that day.  Instead, resume the medication at the next scheduled dose.     Contraindications / Safety Precautions / Adverse Effects:  Contraindications to therapy, safety precautions, and common adverse effects (listed below) were discussed with the patient.  I explained that most patients do NOT experience all these side effects and that this list was not inclusive.  I instructed patient to report any adverse effects to their doctor, pharmacist or nurse.     What You May Expect?  What Should You Do?    Constipation ? Eat a balanced diet with fiber (whole grains, fruit, and raw vegetables)  ? Drink plenty of fluids  ? Try light exercise  ? Speak to your doctor if you have not had a bowel movement in 3 or more days   Nausea and vomiting are common during treatment ? Drink clear fluids and avoid large meals. Get fresh air and rest.  ? Limit spicy, fried foods or foods with a strong smell.  ? Take antinausea drug(s) exactly as directed by your doctor. It is easier to prevent nausea than to treat it.  ? Contact your doctor if nausea lasts more than 1 day or if any vomiting occurs.   Poor Appetite? Don't feel like eating? Weight loss ? Eat foods that you like and try to eat regular small meals.  ? Use meal supplements if possible. See a dietitian.   Diarrhea ? Drink plenty of clear  fluids. Limit hot, spicy, fried foods, foods/drinks with caffeine, orange or prune juice.  ? Try a low fiber BRAT diet (Bananas, white Rice, Apple sauce, Toast made with white bread).  ? Take antidiarrhea drug(s) if given to you by your doctor.   Hair loss, thinning or a change in the texture of your hair may occur within a few days to weeks of treatment. ? Your hair will grow once your treatment is finished.   ? Use a gentle baby shampoo and soft brush. celecoxib (CELEBREX) 200 mg capsule Take one capsule by mouth twice daily.   cyclosporine (RESTASIS) 0.05 % ophthalmic emulsion Apply one drop to both eyes twice daily. Fill largest quantity covered  Indications: keratoconjunctivitis sicca, or dry and inflamed cornea and conjunctiva of the eye   docusate (COLACE) 100 mg capsule Take 100-200 mg by mouth twice daily as needed for Constipation.   duloxetine DR (CYMBALTA) 60 mg capsule Take one capsule by mouth daily.   ferrous sulfate (FEOSOL) 325 mg (65 mg iron) tablet Take 325 mg by mouth daily.   furosemide (LASIX) 40 mg tablet Take 40 mg by mouth daily as needed (leg swelling).   granisetron (KYTRIL) 1 mg tablet Take one tablet by mouth every 12 hours as needed for Nausea.   levothyroxine (SYNTHROID) 200 mcg tablet Take 200 mcg by mouth daily 30 minutes before breakfast. Takes + for a total of qday   levothyroxine (SYNTHROID) 50 mcg tablet Take 50 mcg by mouth daily 30 minutes before breakfast. Takes + for a total of qday   losartan-hydrochlorothiazide (HYZAAR) 100-12.5 mg tablet Take 1 tablet by mouth every morning. Patient take 1/2 tablet daily.   Omeprazole-Sodium Bicarbonate 20-1,680 mg pack Take 1 tablet by mouth daily.   ondansetron (ZOFRAN) 8 mg tablet Take 1-2 tabs, one hour before chemotherapy and every 6 hours as needed, but no more than 3 tabs in 24 hours.   polyethylene glycol 3350 (MIRALAX) 17 g packet Take one packet by mouth daily.  Patient taking differently: Take 17 g by mouth as Needed.   predniSONE (DELTASONE) 20 mg tablet Take one tablet by mouth daily with breakfast.   senna/docusate (SENOKOT-S) 8.6/50 mg tablet Take 1-2 tablets by mouth daily as needed (for constipation).   temozolomide (TEMODAR) 20 mg capsule Take one capsule (20 mg) by mouth twice daily with 1 other temozolomide prescription for 25 mg per dose.   temozolomide (TEMODAR) 5 mg capsule Take one capsule (5 mg) by mouth twice daily with 1 other temozolomide prescription for 25 mg per dose.   traMADol (ULTRAM) 50 mg tablet Take 1 tablet by mouth twice daily as needed for Pain PO. Added per 11/16/18 records from [facility] pharmacy   vitamins, multiple cap Take 1 capsule by mouth daily.       Drug-drug and drug-food interactions with the new therapy were assessed and reviewed with the patient. No significant drug-drug interactions were identified.      Reproductive Concerns:  Reproductive concerns were reviewed with the patient. As patient is a female not of child-bearing potential, education was not applicable.     What to do with any unused or expired medications:  Andrea [name] was instructed to return any unused or expired medication to a disposal bin at one of the retail pharmacy locations or to utilize a community drug take back program.  Instructed the patient not to flush the medication down the toilet.     Monitoring:  Monitoring and follow-up plan was discussed with patient. Andrea [name] was instructed to contact the oral chemotherapy pharmacist at [phone] if they have any questions or concerns regarding their medication therapy. Informed the patient that we would send the prescription to a specialty pharmacy and that the pharmacy would be calling the patient to schedule a shipment.  Emphasized if their phone calls were not answered, the specialty pharmacy would not ship the medication.    This medication is considered high risk per our internal oral chemotherapy risk categorization and the patient will be contacted for education, toxicity check at 2 weeks, and reassessment every 3 months, if applicable (high risk monitoring).       Patient was given the opportunity to ask questions. Patient agreed with plan and had questions regarding how long she needs to take the prednisone that was prescribed last week.  I relayed this question to Dr. Orpah Cobb team.      Brent Bulla, [facility]  Clinical Pharmacist  01/26/19

## 2019-01-28 ENCOUNTER — Encounter: Admit: 2019-01-28 | Discharge: 2019-01-28 | Payer: Commercial Managed Care - PPO

## 2019-01-28 DIAGNOSIS — C719 Malignant neoplasm of brain, unspecified: Principal | ICD-10-CM

## 2019-01-28 MED ORDER — IMS MIXTURE TEMPLATE
10 mg/m2 | Freq: Two times a day (BID) | ORAL | 0 refills | Status: CN
Start: 2019-01-28 — End: ?

## 2019-01-28 MED ORDER — TEMOZOLOMIDE 5 MG PO CAP
5 mg | ORAL_CAPSULE | Freq: Two times a day (BID) | ORAL | 0 refills | Status: DC
Start: 2019-01-28 — End: 2019-02-25

## 2019-01-28 MED ORDER — TEMOZOLOMIDE 20 MG PO CAP
20 mg | ORAL_CAPSULE | Freq: Two times a day (BID) | ORAL | 0 refills | Status: DC
Start: 2019-01-28 — End: 2019-02-25

## 2019-01-28 NOTE — Telephone Encounter
Spoke w/Cindy @[facility]  [phone] Temodar script is pending review. Should be a $0 co-pay.Will f/u on Monday.

## 2019-01-31 ENCOUNTER — Encounter: Admit: 2019-01-31 | Discharge: 2019-01-31 | Payer: Commercial Managed Care - PPO

## 2019-02-01 ENCOUNTER — Encounter: Admit: 2019-02-01 | Discharge: 2019-02-01 | Payer: Commercial Managed Care - PPO

## 2019-02-01 DIAGNOSIS — C719 Malignant neoplasm of brain, unspecified: Principal | ICD-10-CM

## 2019-02-01 NOTE — Progress Notes
No encounter as patient did not show for scheduled televideo appointment on 02/01/2019 at 1:00pm. Message left with patient encouraging contacting clinic scheduler if interested in services in the future. Scheduler number provided. No follow-up at this time.

## 2019-02-07 ENCOUNTER — Encounter: Admit: 2019-02-07 | Discharge: 2019-02-07 | Payer: Commercial Managed Care - PPO

## 2019-02-08 ENCOUNTER — Encounter: Admit: 2019-02-08 | Discharge: 2019-02-08 | Payer: Commercial Managed Care - PPO

## 2019-02-08 NOTE — Telephone Encounter
CR 0.74 12/23/2018 10:03 AM    GLU 99 12/23/2018 10:03 AM    Lab Results   Component Value Date/Time    CA 9.5 12/23/2018 10:03 AM    PO4 3.0 08/26/2018 02:44 PM    ALBUMIN 3.7 12/23/2018 10:03 AM    TOTPROT 7.2 12/23/2018 10:03 AM    ALKPHOS 34 12/23/2018 10:03 AM    AST 34 12/23/2018 10:03 AM    ALT 39 12/23/2018 10:03 AM    TOTBILI 0.4 12/23/2018 10:03 AM    GFR >60 12/23/2018 10:03 AM    GFRAA >60 12/23/2018 10:03 AM              Creatinine clearance cannot be calculated (Patient's most recent lab result is older than the maximum 30 days allowed.)      Adverse Effects Assessment     Andrea [name] is having the following adverse effects: fatigue, constipation, and nausea.   None of the adverse effects were severe enough to interfere with adherence.     Interaction Check    Andrea [name] reports the following medication changes since the last medication history during their education visit: none    Drug-drug and drug-food interactions between the patients??? specialty medication and her medication list were assessed.     No significant drug-drug interactions were identified.     Andrea [name] was instructed to speak with her health care provider and/or the oral chemotherapy pharmacist before starting any new drug, including prescription or over the counter, natural / herbal products, or vitamins.     Risk Evaluation and Mitigation Strategy (REMS) Assessment    No REMS is required for this medication.    Followup Plan     Andrea [name] was encouraged to call the oral chemotherapy pharmacist at [phone] with questions.   Re-assessment has been completed. Next reassessment planned for 3 month(s).     Ramond Craver, [facility]  Oncology Clinical Pharmacist  02/08/2019

## 2019-02-09 ENCOUNTER — Encounter: Admit: 2019-02-09 | Discharge: 2019-02-09 | Payer: Commercial Managed Care - PPO

## 2019-02-09 DIAGNOSIS — C719 Malignant neoplasm of brain, unspecified: Principal | ICD-10-CM

## 2019-02-09 LAB — CBC AND DIFF

## 2019-02-09 LAB — COMPREHENSIVE METABOLIC PANEL

## 2019-02-09 IMAGING — CR CHEST
1 series · 1 of 1 positions shown · non-contrast
Comparison: none

[chest port x-wise]
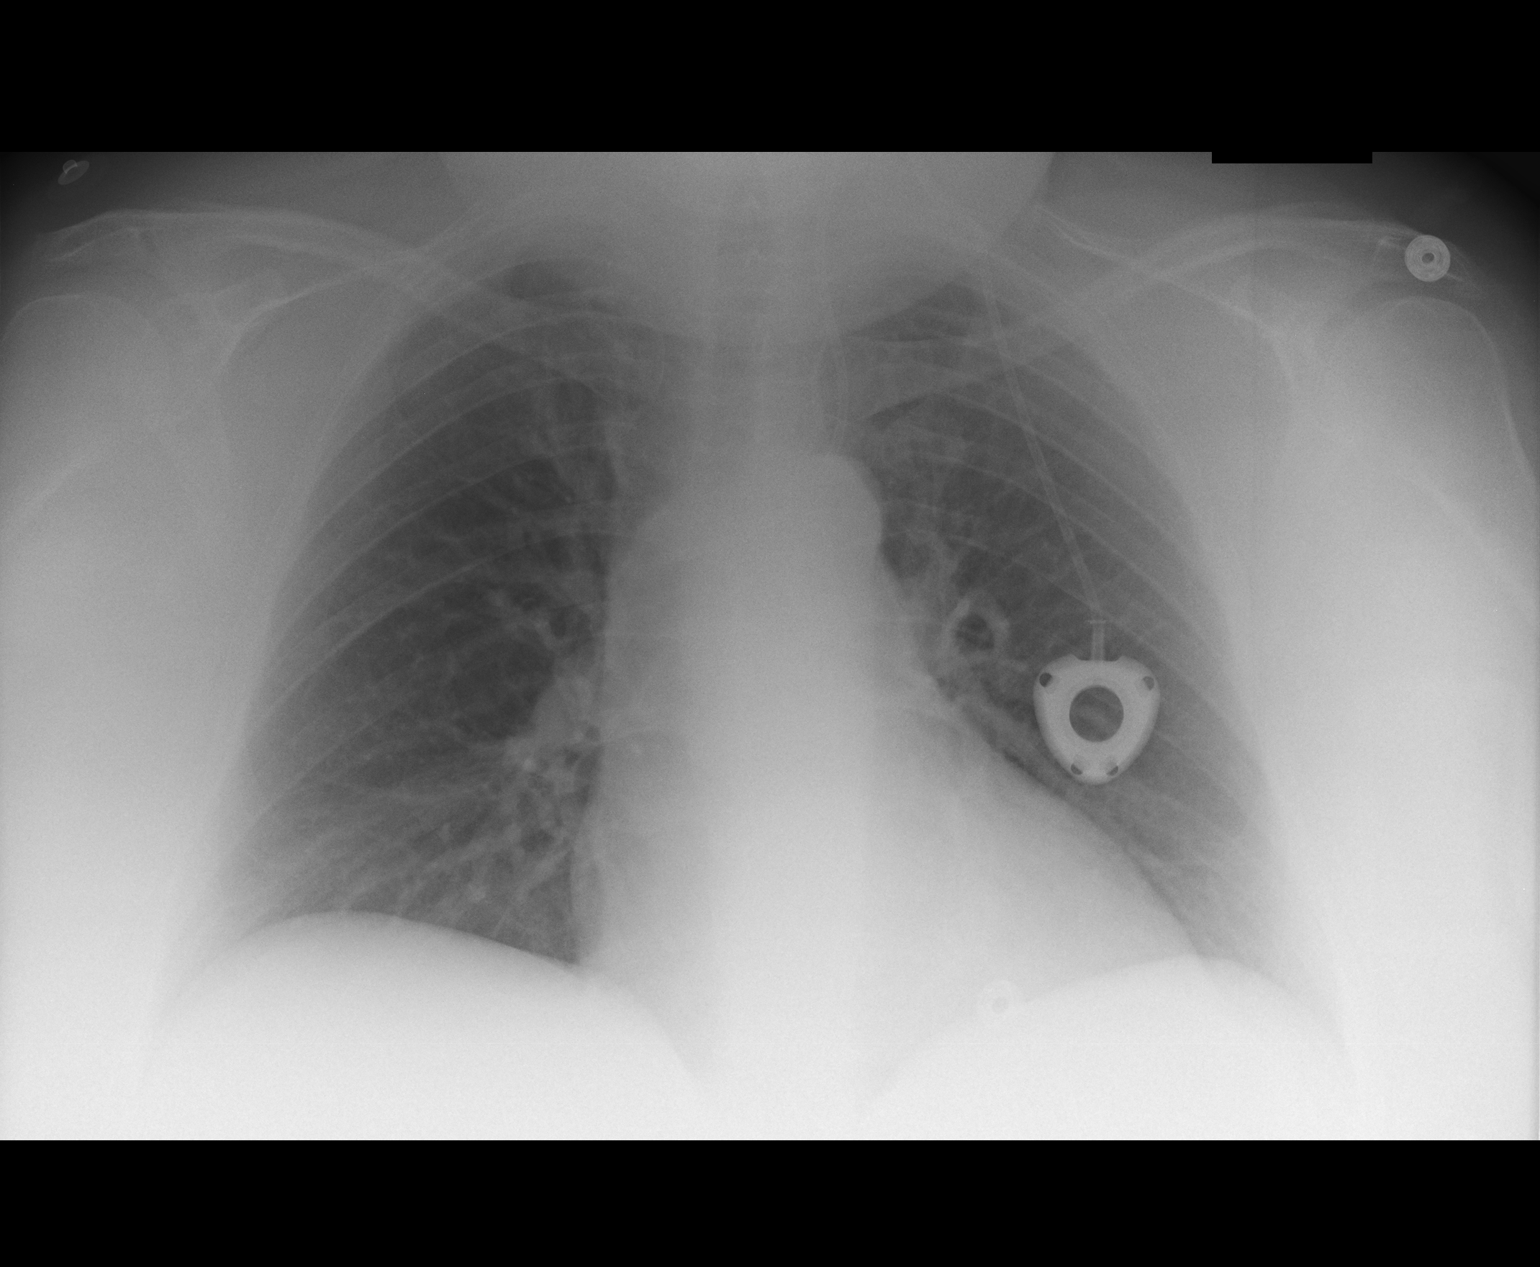

[1 of 1 positions shown; findings below may reference images not displayed]

XR chest 1v post procedure Sex:

EXAM

RADIOLOGICAL EXAMINATION, CHEST; SINGLE VIEW, FRONTAL CPT 40000

INDICATION

Power port placement. Tj

TECHNIQUE

1 view of the chest was acquired.

COMPARISONS

There are no previous examinations available for comparison at the time of dictation.

FINDINGS

Single AP portable view of the chest shows no focal consolidation, effusions, or pulmonary edema.
Cardiac and mediastinal contours are unremarkable. Left-sided Port-A-Cath is identified. There is no
pneumothorax. The bony structures appear adequately mineralized.

IMPRESSION

No acute cardiopulmonary process. Status post left power port placement.

Tech Notes:

powerport placement. tj

## 2019-02-09 IMAGING — RF FL fluroguide for vein device
1 series · 1 of 1 positions shown · non-contrast
Comparison: none

[Series 1: run · 1 of 1 slices shown]
[im 1/1]
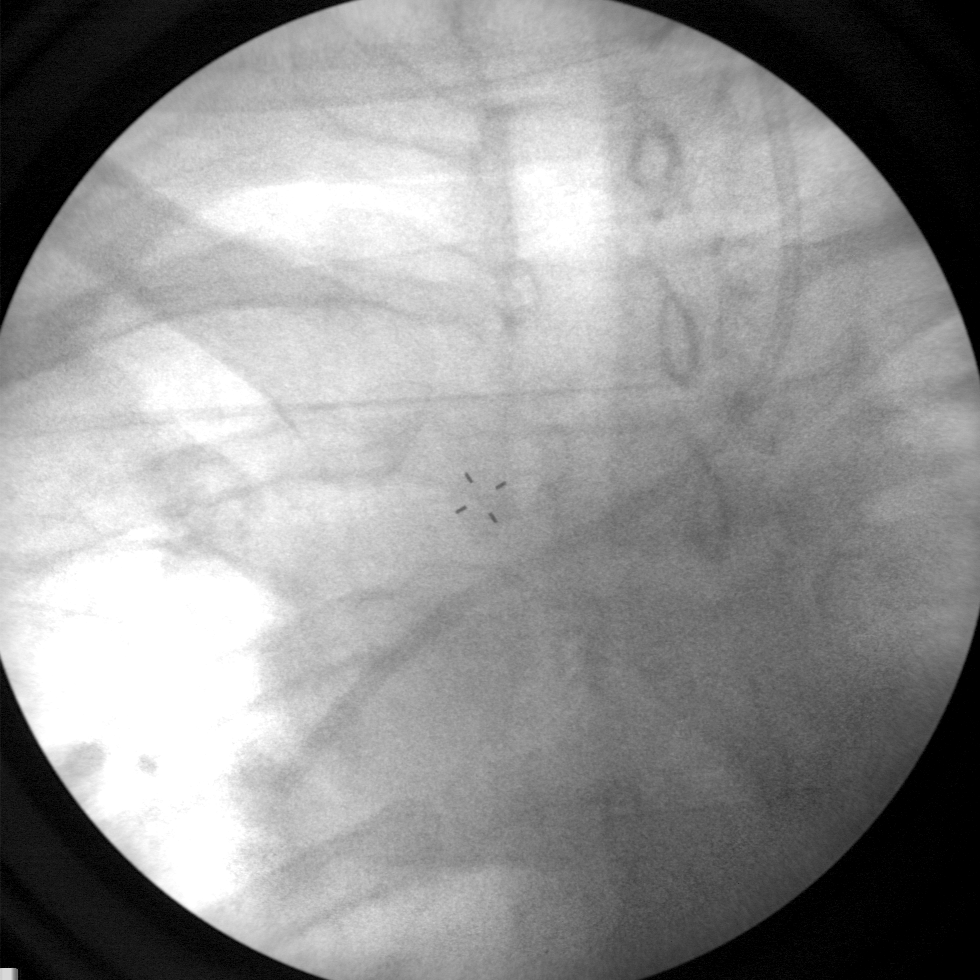

[1 of 1 positions shown; findings below may reference images not displayed]

DIAGNOSTIC STUDIES

Fluoroscopic monitoring for power port placement.

EXAM

Intraoperative fluoroscopic monitoring.

INDICATION

Power port placement.

TECHNIQUE

Fluoroscopic monitoring was provided for a total of 281.3 seconds in the performance of this
procedure. One single static image was obtained.

COMPARISONS

There are no previous examinations available for comparison at the time of dictation.

FINDINGS

Single fluoroscopic image demonstrates the distal aspect of a power port at the distal innominate
vein/SVC junction.

IMPRESSION

As above.

Tech Notes:

POWERPORT PLACEMENT. 1 IMAGE. FLUORO DOSE 51.54 mGy. FLUORO TIME 281 SECONDS. TJ

## 2019-02-22 ENCOUNTER — Encounter: Admit: 2019-02-22 | Discharge: 2019-02-22 | Payer: Commercial Managed Care - PPO

## 2019-02-23 ENCOUNTER — Encounter: Admit: 2019-02-23 | Discharge: 2019-02-23 | Payer: Commercial Managed Care - PPO

## 2019-02-23 LAB — COMPREHENSIVE METABOLIC PANEL

## 2019-02-23 LAB — CBC AND DIFF: Lab: 4.2

## 2019-02-24 ENCOUNTER — Encounter: Admit: 2019-02-24 | Discharge: 2019-02-24 | Payer: Commercial Managed Care - PPO

## 2019-02-25 ENCOUNTER — Encounter: Admit: 2019-02-25 | Discharge: 2019-02-25 | Payer: Commercial Managed Care - PPO

## 2019-02-25 DIAGNOSIS — C719 Malignant neoplasm of brain, unspecified: Principal | ICD-10-CM

## 2019-02-25 MED ORDER — TEMOZOLOMIDE 20 MG PO CAP
20 mg | ORAL_CAPSULE | Freq: Two times a day (BID) | ORAL | 0 refills | Status: DC
Start: 2019-02-25 — End: 2019-03-24

## 2019-02-25 MED ORDER — IMS MIXTURE TEMPLATE
10 mg/m2 | Freq: Two times a day (BID) | ORAL | 0 refills | Status: CN
Start: 2019-02-25 — End: ?

## 2019-02-25 MED ORDER — TEMOZOLOMIDE 5 MG PO CAP
5 mg | ORAL_CAPSULE | Freq: Two times a day (BID) | ORAL | 0 refills | Status: DC
Start: 2019-02-25 — End: 2019-03-24

## 2019-02-25 NOTE — Progress Notes
Received lab results for the patient from [facility]. CBC w/Diff and CMP results scanned to patient's chart.     [facility] also faxed over results from patient's COVID-19 test. Results are negative for SARs-CoV-2. Results sent to central scan to be added to the patient's chart.

## 2019-03-07 ENCOUNTER — Encounter: Admit: 2019-03-07 | Discharge: 2019-03-07 | Payer: Commercial Managed Care - PPO

## 2019-03-07 DIAGNOSIS — C719 Malignant neoplasm of brain, unspecified: Principal | ICD-10-CM

## 2019-03-07 LAB — COMPREHENSIVE METABOLIC PANEL

## 2019-03-07 LAB — CBC AND DIFF

## 2019-03-11 ENCOUNTER — Encounter: Admit: 2019-03-11 | Discharge: 2019-03-11 | Payer: Commercial Managed Care - PPO

## 2019-03-14 ENCOUNTER — Encounter: Admit: 2019-03-14 | Discharge: 2019-03-14 | Payer: Commercial Managed Care - PPO

## 2019-03-14 NOTE — Progress Notes
Order faxed to [facility] for MRI head w/wo contrast @ 1400

## 2019-03-17 ENCOUNTER — Encounter: Admit: 2019-03-17 | Discharge: 2019-03-18 | Payer: Commercial Managed Care - PPO

## 2019-03-17 IMAGING — MR Head^Brain
9 of 11 series · 37 of 48 positions shown · non-contrast
Comparison: none

[Series 3: DWI · axial · 5.0mm · 1.80mm/px · z∈[-19,+100]mm · 9 of 62 slices shown (1 of 2)]
[im 1/62]
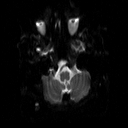
[im 8/62]
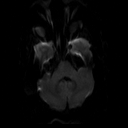
[im 16/62]
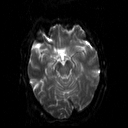
[im 23/62]
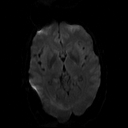
[im 31/62]
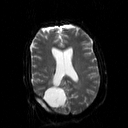
[im 39/62]
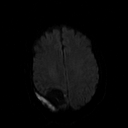
[im 46/62]
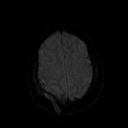
[im 54/62]
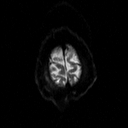
[im 62/62]
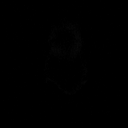

[Series 4: DWI · axial · 5.0mm · 1.80mm/px · z∈[-19,+100]mm · 3 of 21 slices shown (2 of 2)]
[im 1/21]
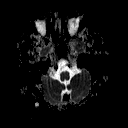
[im 11/21]
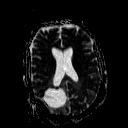
[im 21/21]
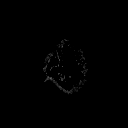

[Series 5: T2 · axial · 5.0mm · 0.72mm/px · z∈[-42,+97]mm · 3 of 24 slices shown]
[im 1/24]
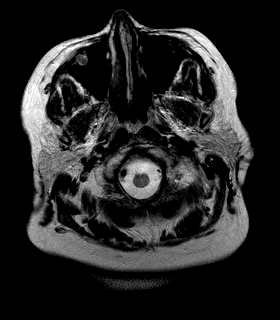
[im 12/24]
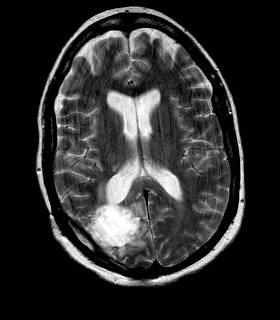
[im 24/24]
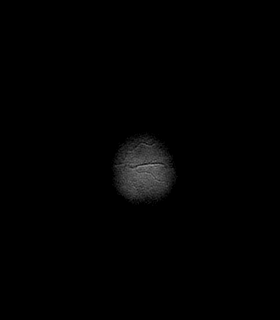

[Series 6: T1 · axial · 3.0mm · 0.45mm/px · z∈[-28,+84]mm · 4 of 32 slices shown]
[im 1/32]
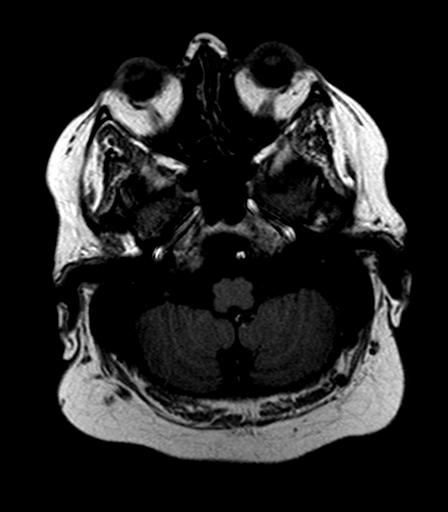
[im 11/32]
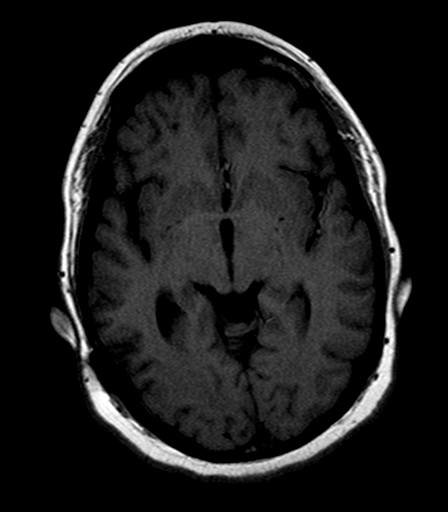
[im 21/32]
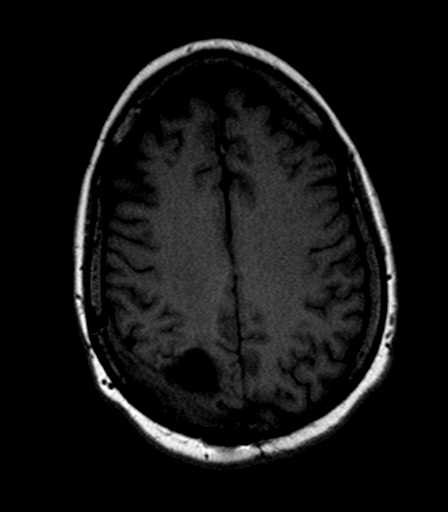
[im 32/32]
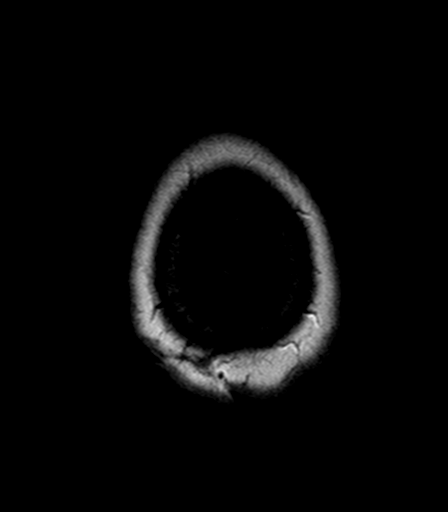

[Series 14: FLAIR · axial · 3.0mm · 0.45mm/px · z∈[-30,+78]mm · 4 of 30 slices shown (1 of 3)]
[im 1/30]
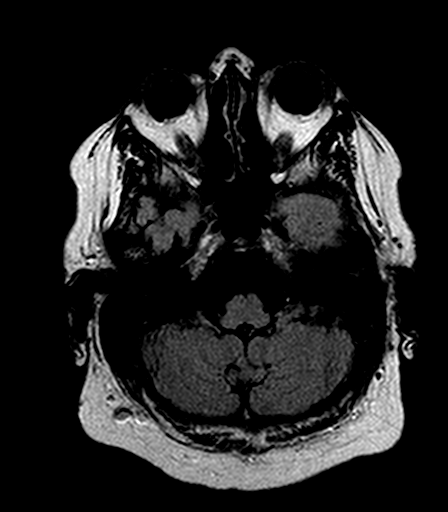
[im 10/30]
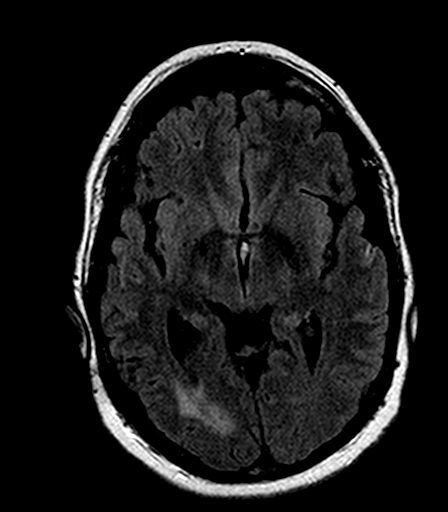
[im 20/30]
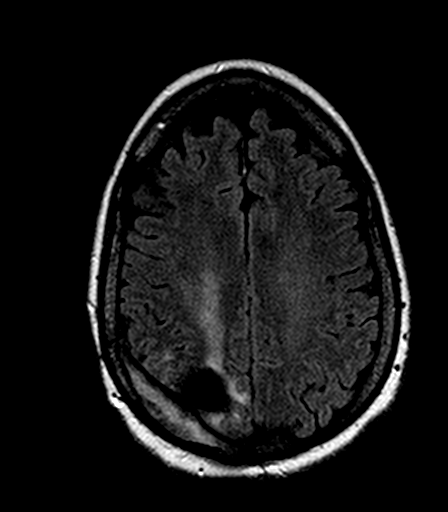
[im 30/30]
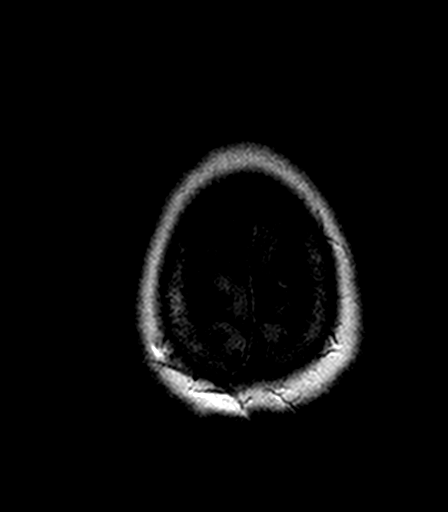

[Series 15: FLAIR · coronal · 3.0mm · 0.45mm/px · 5 of 39 slices shown (2 of 3)]
[im 1/39]
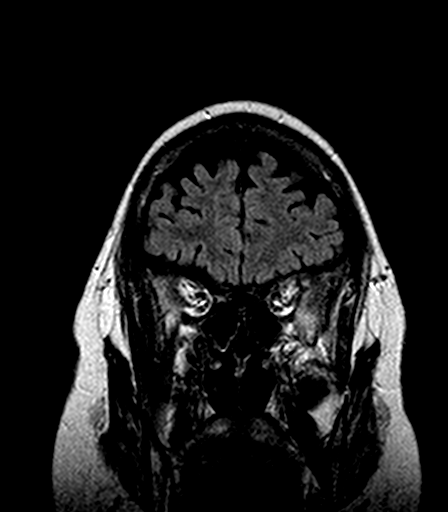
[im 10/39]
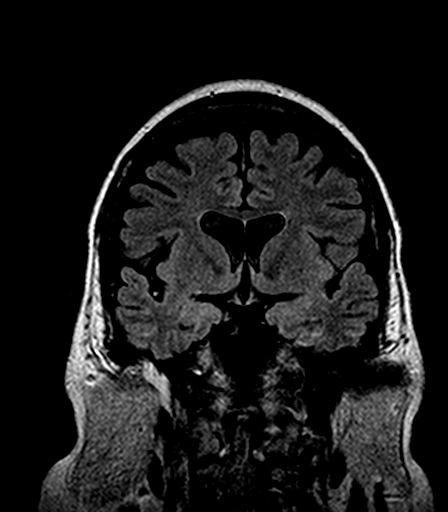
[im 20/39]
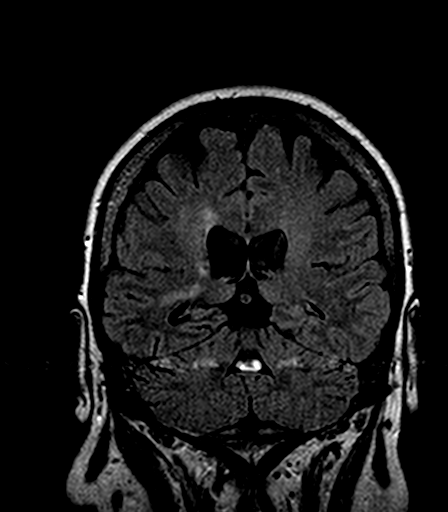
[im 29/39]
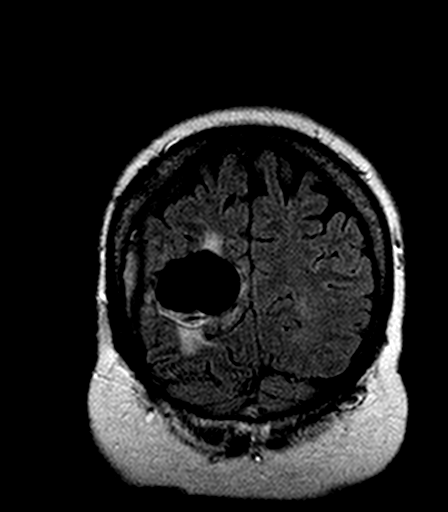
[im 39/39]
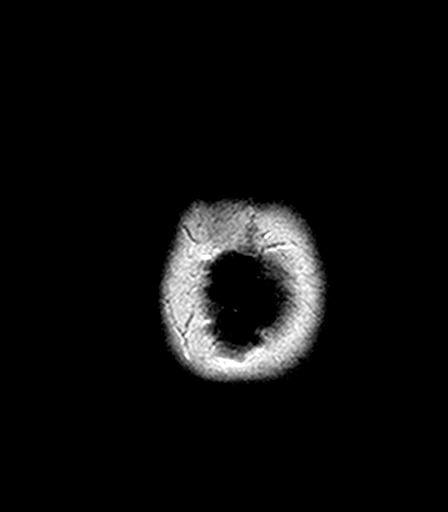

[Series 16: FLAIR · sagittal · 3.0mm · 0.51mm/px · 4 of 30 slices shown (3 of 3)]
[im 1/30]
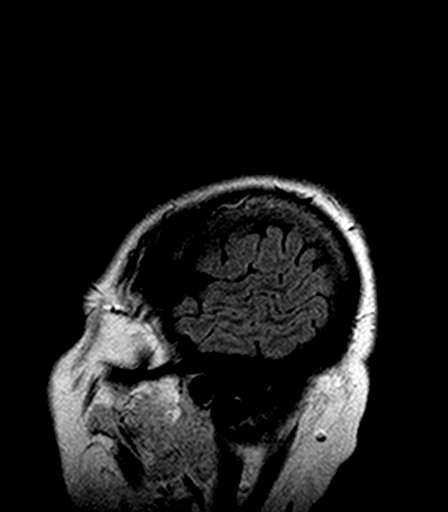
[im 10/30]
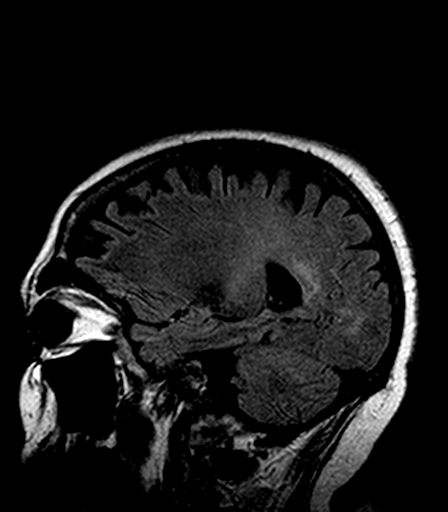
[im 20/30]
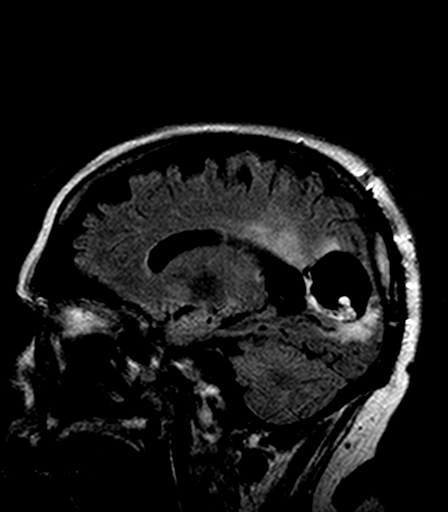
[im 30/30]
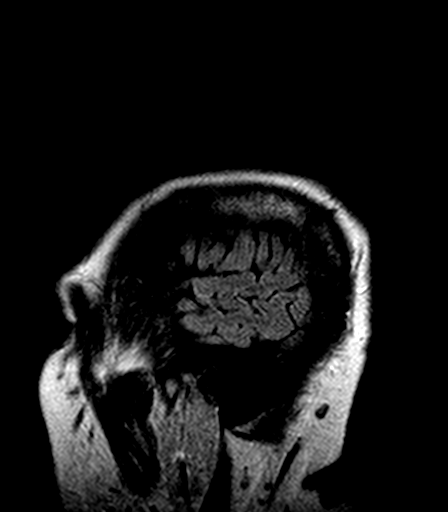

[Series 17: T1 fat-sat post-contrast · axial · 3.0mm · 0.90mm/px · z∈[-35,+84]mm · 4 of 33 slices shown (1 of 2)]
[im 1/33]
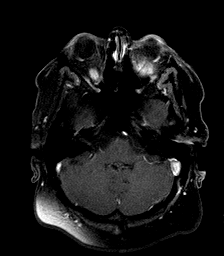
[im 11/33]
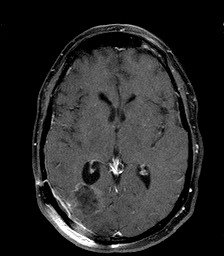
[im 22/33]
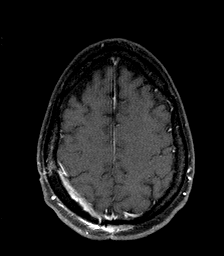
[im 33/33]
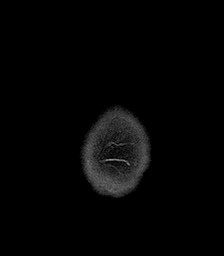

[Series 18: T1 fat-sat post-contrast · coronal · 3.5mm · 0.90mm/px · 1 of 34 slices shown (2 of 2)]
[im 1/34]
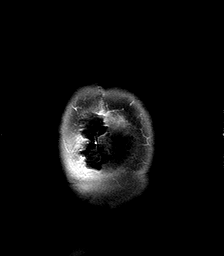

[37 of 48 positions shown; findings below may reference images not displayed]

EXAM

MAGNETIC RESONANCE IMAGING, BRAIN (INCLUDING BRAIN STEM) WITHOUT CONTRAST MATERIAL, FOLLOWED BY
CONTRAST MATERIAL(S) AND FURTHER SEQUENCES, CPT 85882

INDICATION

Glioblastoma-see comments for further instructions
Patient had a hard time holding still due to pain, patient came back second day for contrast
portion.     History glioblastoma, surgical removal in January - patient states at that time the(..)

TECHNIQUE

Multiplanar, multiecho sequences were generated through the brain utilizing a 1.5 Tesla magnet.
Images were obtained after the infusion of intravenous gadolinium.

COMPARISONS

CT scan of the brain dated 12/27/2018.

FINDINGS

There is no evidence of restricted diffusion to suggest an acute/subacute cerebrovascular accident.
There is no mass effect or shift in the midline structures.

Normal T2 flows are identified in the vertebral arteries, the basilar artery, the cavernous
carotids, and the major vessels of the circle of Willis. There are no retrobulbar masses. There is
no evidence of significant white matter disease.

Postsurgical changes with a right posterior parietal craniotomy and tumor resection are
redemonstrated. Following the infusion of gadolinium there is mild enhancement along the operative
bed with no evidence of significant mass or nodularity present at this time.

Visualized portions of the maxillary, ethmoid, sphenoid, and frontal sinuses are clear. Minimal
hemosiderin ring is identified along the surgical bed.

Sagittal images demonstrate no evidence of herniation of the cerebellar tonsils. The brainstem,
corpus callosum, pituitary, sella turcica, optic chiasm, and clivus appear unremarkable. There is
normal fatty marrow signal in the skull base and overlying calvarium. Postoperative seroma along the
right posterior parietal region is noted. There is mild enhancement of the meninges along the
surgical bed.

Following the infusion of gadolinium, there are no abnormal enhancing masses. There are portions of
the skullbase and cerebellum that were not imaged due to the patient's inability to fully cooperate
with the exam.

IMPRESSION

Postsurgical changes in the right posterior parietal lobe consistent with craniotomy and tumor
resection. There is mild enhancement along the operative bed and along the meninges in the operative
bed. No evidence of residual enhancing masses to suggest recurrence of tumor is noted at this time.

Tech Notes:

Patient had a hard time holding still due to pain, patient came back second day for contrast
portion.

History glioblastoma, surgical removal in January - patient states at that time the cancer was not
evident just the cavity it was contained in.  Patient recieves chemo orally.
BG
15cc gadavist via pot.

## 2019-03-22 ENCOUNTER — Encounter: Admit: 2019-03-22 | Discharge: 2019-03-22 | Payer: Commercial Managed Care - PPO

## 2019-03-23 ENCOUNTER — Encounter: Admit: 2019-03-23 | Discharge: 2019-03-23 | Payer: Commercial Managed Care - PPO

## 2019-03-23 DIAGNOSIS — C719 Malignant neoplasm of brain, unspecified: Principal | ICD-10-CM

## 2019-03-23 LAB — COMPREHENSIVE METABOLIC PANEL

## 2019-03-23 LAB — CBC AND DIFF

## 2019-03-24 ENCOUNTER — Encounter: Admit: 2019-03-24 | Discharge: 2019-03-24 | Payer: Commercial Managed Care - PPO

## 2019-03-24 DIAGNOSIS — C719 Malignant neoplasm of brain, unspecified: Principal | ICD-10-CM

## 2019-03-24 MED ORDER — TEMOZOLOMIDE 20 MG PO CAP
20 mg | ORAL_CAPSULE | Freq: Two times a day (BID) | ORAL | 0 refills | Status: DC
Start: 2019-03-24 — End: 2019-04-25

## 2019-03-24 MED ORDER — IMS MIXTURE TEMPLATE
10 mg/m2 | Freq: Two times a day (BID) | ORAL | 0 refills | Status: CN
Start: 2019-03-24 — End: ?

## 2019-03-24 MED ORDER — TEMOZOLOMIDE 5 MG PO CAP
5 mg | ORAL_CAPSULE | Freq: Two times a day (BID) | ORAL | 0 refills | Status: DC
Start: 2019-03-24 — End: 2019-04-25

## 2019-03-25 ENCOUNTER — Encounter: Admit: 2019-03-25 | Discharge: 2019-03-25 | Payer: Commercial Managed Care - PPO

## 2019-03-25 DIAGNOSIS — C719 Malignant neoplasm of brain, unspecified: Principal | ICD-10-CM

## 2019-03-31 ENCOUNTER — Encounter: Admit: 2019-03-31 | Discharge: 2019-03-31

## 2019-03-31 DIAGNOSIS — I10 Essential (primary) hypertension: Secondary | ICD-10-CM

## 2019-03-31 DIAGNOSIS — E079 Disorder of thyroid, unspecified: Secondary | ICD-10-CM

## 2019-03-31 DIAGNOSIS — C719 Malignant neoplasm of brain, unspecified: Principal | ICD-10-CM

## 2019-03-31 DIAGNOSIS — D6859 Other primary thrombophilia: Secondary | ICD-10-CM

## 2019-03-31 DIAGNOSIS — F909 Attention-deficit hyperactivity disorder, unspecified type: Secondary | ICD-10-CM

## 2019-03-31 DIAGNOSIS — F329 Major depressive disorder, single episode, unspecified: Secondary | ICD-10-CM

## 2019-03-31 NOTE — Progress Notes
RTC in 8 weeks with prior MRI obtained in [place]. Patient to get blood work 6/10 in [place].

## 2019-03-31 NOTE — Progress Notes
[facility]  Brain and Spine Tumor Program        Neuro-oncology Clinic Progress Note    Patient Name: Andrea [name]  DOB: [birth year]  MRN: [id number]  DOS: 03/31/2019      Subjective:   Andrea [name] is a 48 y.o. 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    Reason For Visit:  Neuro-Oncology Care    Treatment History:  08/26/18: Presented to the ED after becoming lost while driving to her daughter's house which is 3 minutes from her home. She reports a headache for about 2 weeks which became gradually worse and she attributed to sinus problems. She states she has felt foggy lately, fatigued and her memory has been poor. Her family reports she has been running into things around the house, she drove on the wrong side of the road, forgot to go to work, and has been jumbling her words and is confused.  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 A CT head showed a large right posterior cerebrum lesion and she was transferred to [facility] for a higher level of care.  ???  08/26/18: CT Head - Large masslike process throughout the right posterior cerebrum with probably associated hemorrhage. Vasogenic edema with diffuse sulcal effacement and subfalcine herniation. ???Suggestive diffuse thickening and increased attenuation throughout the right cerebral cortex/inter up. ???  ???  08/26/18: MRI Head - ???Large cerebral necrotic enhancing mass centered within the right posterior cerebral hemisphere, favored for high-grade glioma. Cystic metastasis is considered less likely. Localized mass effect results in right hemispheric sulcal effacement, 7 mm leftward shift, and early right uncal herniation. Moderate FLAIR hyperintensity surrounding the mass and involving the bilateral rostral callosum, which may represent a combination of vasogenic edema and/or tumor elements.  ??? 08/26/18: CT CAP - CHEST: No discrete primary malignancy or thoracic metastatic disease. At least mild coronary artery calcification. ABDOMEN AND PELVIS: No discrete primary malignancy or abdominopelvic metastatic disease. Probable left ovarian cyst, incompletely characterized. Small fat containing supraumbilical hernia.  ???  08/27/18: Right craniotomy -  Path: High grade glioma with necrosis, mitoses and mvp.IDH1 negative by ihc, MGMT pending  ???  08/28/18: MRI Head - Interval right parietal craniotomy and mass resection, with expected hemorrhage within the operative cavity. There is mild postoperative pneumocephalus and a relatively small amount of postoperative gas, fluid, and blood products deep to the craniotomy. Residual peripheral enhancement along the anterior inferior origin of the resection bed, most compatible with residual tumor   Improvement in right cerebral mass effect with improving leftward shift and sulcal effacement, with resolved or nearly uncal herniation. No significant change in FLAIR signal abnormality within the right posterior cerebral hemisphere and along the bilateral rostral callosum  ???  08/31/18: Admitted to inpatient rehab at [facility] with tentative DC date 09/15/18    Interval History:  Andrea [name] is seen today for telehealth visit. Her fatigue continues although improved. Dense left hemianoposia continues as well.         Review of Systems    ROS is conducted in all 12 systems and, except as noted above in HPI, is summarized below:    No headaches, no vision changes.  No difficulty with speech or swallowing.  No chest pain, back pain, no breathing difficulties.  No abdominal pain.  No extremity edema  No neurologic changes, specifically weakness or sensory  changes  No recent seizures, no recent falls  No fevers, chills or sweats. No cough  No nausea, vomiting, diarrhea or constipation  No unintended weight loss.  No excessive fatigue.  No skin changes or rash  No depression complaints Past Medical History:     Medical History:   Diagnosis Date   ??? ADHD    ??? Depression    ??? Disorder of thyroid gland    ??? Hypertension      Surgical History:   Procedure Laterality Date   ??? Right craniotomy for resection of tumor Right 08/27/2018    Performed by Theotis Barrio, MD at [facility] OR   ??? MICROSURGICAL TECHNIQUES - REQUIRING OPERATING MICROSCOPE USE N/A 08/27/2018    Performed by Theotis Barrio, MD at [facility] OR   ??? STEREOTACTIC COMPUTER-ASSISTED CRANIAL PROCEDURE - INTRADURAL N/A 08/27/2018    Performed by Theotis Barrio, MD at [facility] OR   ??? ULTRASOUND GUIDANCE - INTRAOPERATIVE  08/27/2018    Performed by Theotis Barrio, MD at [facility] OR   ??? CRANIECTOMY/ BONE FLAP CRANIOTOMY EXCISION SUPRATENTORIAL BRAIN TUMOR Right 08/28/2018    Performed by Theotis Barrio, MD at [facility] OR   ??? CHOLECYSTECTOMY     ??? HX APPENDECTOMY     ??? HX HYSTERECTOMY     ??? HX JOINT REPLACEMENT     ??? HYSTERECTOMY       Social History     Socioeconomic History   ??? Marital status: Single     Spouse name: Not on file   ??? Number of children: 1   ??? Years of education: Not on file   ??? Highest education level: Not on file   Occupational History   ??? Not on file   Tobacco Use   ??? Smoking status: Current Some Day Smoker     Types: Cigarettes   ??? Smokeless tobacco: Never Used   Substance and Sexual Activity   ??? Alcohol use: Never     Frequency: Never   ??? Drug use: Never   ??? Sexual activity: Not on file   Other Topics Concern   ??? Not on file   Social History Narrative   ??? Not on file       Current Medications:  ??? acetaminophen (TYLENOL) 325 mg tablet Take two tablets by mouth every 6 hours as needed for Pain.   ??? ALPRAZolam (XANAX) 0.5 mg tablet Take 1 tablet by mouth three times daily as needed for Anxiety PO.   ??? apixaban (ELIQUIS) 5 mg tablet Take one tablet by mouth twice daily. Indications: a clot in the lung   ??? buPROPion XL (WELLBUTRIN XL) 150 mg tablet Take one tablet by mouth daily. Do not crush or chew. ??? calcium carbonate (TUMS) 500 mg (200 mg elemental calcium) chewable tablet Chew 500 mg by mouth as Needed.   ??? celecoxib (CELEBREX) 200 mg capsule Take one capsule by mouth twice daily.   ??? cyclosporine (RESTASIS) 0.05 % ophthalmic emulsion Apply one drop to both eyes twice daily. Fill largest quantity covered  Indications: keratoconjunctivitis sicca, or dry and inflamed cornea and conjunctiva of the eye   ??? docusate (COLACE) 100 mg capsule Take 100-200 mg by mouth twice daily as needed for Constipation.   ??? duloxetine DR (CYMBALTA) 60 mg capsule Take one capsule by mouth daily.   ??? ferrous sulfate (FEOSOL) 325 mg (65 mg iron) tablet Take 325 mg by mouth daily.   ??? furosemide (LASIX) 40 mg tablet Take 40 mg by mouth  daily as needed (leg swelling).   ??? granisetron (KYTRIL) 1 mg tablet Take one tablet by mouth every 12 hours as needed for Nausea.   ??? HYDROcodone/acetaminophen (NORCO) 5/325 mg tablet Take 1 tablet by mouth every 6 hours as needed for Pain   ??? levothyroxine (SYNTHROID) 200 mcg tablet Take 200 mcg by mouth daily 30 minutes before breakfast.   ??? levothyroxine (SYNTHROID) 50 mcg tablet Take 50 mcg by mouth daily 30 minutes before breakfast. Takes + for a total of qday   ??? losartan-hydrochlorothiazide (HYZAAR) 100-12.5 mg tablet Take 1 tablet by mouth every morning. Patient take 1/2 tablet daily.   ??? Omeprazole-Sodium Bicarbonate 20-1,680 mg pack Take 1 tablet by mouth daily.   ??? ondansetron (ZOFRAN) 8 mg tablet Take 1-2 tabs, one hour before chemotherapy and every 6 hours as needed, but no more than 3 tabs in 24 hours.   ??? polyethylene glycol 3350 (MIRALAX) 17 g packet Take one packet by mouth daily. (Patient taking differently: Take 17 g by mouth as Needed.)   ??? predniSONE (DELTASONE) 20 mg tablet Take one tablet by mouth daily with breakfast.   ??? senna/docusate (SENOKOT-S) 8.6/50 mg tablet Take 1-2 tablets by mouth daily as needed (for constipation). ??? temozolomide (TEMODAR) 20 mg capsule Take one capsule (20 mg) by mouth twice daily with 1 other temozolomide prescription for 25 mg per dose.   ??? temozolomide (TEMODAR) 5 mg capsule Take one capsule (5 mg) by mouth twice daily with 1 other temozolomide prescription for 25 mg per dose.   ??? traMADol (ULTRAM) 50 mg tablet Take 1 tablet by mouth twice daily as needed for Pain PO. Added per 11/16/18 records from [facility] [facility]   ??? vitamins, multiple cap Take 1 capsule by mouth daily.       Allergies:  Allergies   Allergen Reactions   ??? Morphine ANAPHYLAXIS   ??? Codeine RASH   ??? Erythromycin SHORTNESS OF BREATH   ??? Latex RASH   ??? Nsaids (Non-Steroidal Anti-Inflammatory Drug) SEE COMMENTS     History of GI bleed - has been instructed to avoid NSAID's       Vitals:  Vitals:    03/31/19 1415   PainSc: Three     There is no height or weight on file to calculate BSA.    Exam (telehealth visit)    ECOG PS = 1 (based on video observations/reports)    Pain = 0/10    Well nourished, well developed, in no acute distress  Mood and affect appear appropriate for the situation  Cognition appears intact, language is intact  Ambulatory at home    CN grossly intact 2-12  Strength appears intact in all 4 extremities  Balance (ambulation) is intact    Laboratory Results:        Assessment and Plan:  Patient Active Problem List    Diagnosis Date Noted   ??? Glioblastoma (HCC) 09/13/2018     Priority: High   ??? 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 Impaired mobility and ADLs 08/31/2018   ??? Impaired cognition 08/31/2018   ??? HTN (hypertension) 08/31/2018   ??? Hypothyroid 08/31/2018   ??? 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  Her brief treatment history includes presentation with a few weeks of progressive confusion, bumping into things.  Imaging demonstrates a right inferior parietal enhancing mass with surrounding edema.  She underwent radiation with concurrent temozolomide/depakote ending in early January 2020.    We reviewed her MRI scans from 03-17-2019 vs comparisons from 11-02-2018, overall the right parietal/occipital area surgical cavity is unchanged as is the minimal linear enhancement and surrounding FLAIR.  She is tolerating reduced dose (BID dosed) temozolomide much better, will continue with plan, RTC in about 8 weeks with repeat MRI scan, sooner if problems.       I spent a total of 30 minutes today in direct patient evaluation, from 1430 to 1500, with more than 20 minutes spent in treatment plan formulation, symptom management discussion and consultation as outlined above.         Bartholome Bill, MD  Director, Brain and Spine Tumor Program   [facility]  p. [phone]    f. [phone]

## 2019-03-31 NOTE — Progress Notes
Name: Andrea [name]          MRN: [id number]      DOB: 1970-10-28      AGE: 48 y.o.   DATE OF SERVICE: 03/31/2019    Subjective:        Hypercoagulable state, glioblastoma multiforme     Reason for Visit:  Heme/Onc Care      Andrea [name] is a 47 y.o. female.     Cancer Staging  No matching staging information was found for the patient.    History of Present Illness  Obtained patient's verbal consent to treat them and their agreement to [facility] financial policy and NPP via this telehealth visit during the [facility]  Patient currently feels well.  She does have some fatigue.  She denies any excessive bruising or bleeding.  She denies chest pain or shortness of breath.  She has been tolerating apixaban.  Oncologic history:  08/26/2018: Confusion, headache.CT Head - Large masslike process throughout the right posterior cerebrum with probably associated hemorrhage. Vasogenic edema with diffuse sulcal effacement and subfalcine herniation.  Suggestive diffuse thickening and increased attenuation throughout the right cerebral cortex/inter up.  CT CAP: No metastatic or primary disease  08/27/2018: Right craniotomy: Pathology showed GBM.  MGMT methylated, IDH 1-R1 30 2H wild-type.  09/16/2018-11/05/2018: Chemoradiation with temozolomide  12/06/2018: CT angio showed PE.  Initially treated with Lovenox  12/23/2018: Switched to apixaban.  12/24/2018: Temodar maintenance.    Past medical history: Attention deficit disorder, depression, hypertension, GBM, hypothyroidism.    Past surgical history: C-section x2, appendectomy, cholecystectomy, hysterectomy, tonsillectomy, recent craniotomy.    Family history: Father apparently had factor V Leiden.  No history of thromboembolism and other family members.    Social history: Former smoker, infrequent alcohol       Review of Systems   Constitutional: Positive for fatigue.   HENT: Negative.    Eyes: Positive for visual disturbance (Left visual field defect). Respiratory: Negative.    Cardiovascular: Negative.    Gastrointestinal: Negative.    Genitourinary: Negative.    Musculoskeletal: Negative.    Skin: Negative.    Neurological: Negative.    Psychiatric/Behavioral: Negative.          Objective:         ??? acetaminophen (TYLENOL) 325 mg tablet Take two tablets by mouth every 6 hours as needed for Pain.   ??? ALPRAZolam (XANAX) 0.5 mg tablet Take 1 tablet by mouth three times daily as needed for Anxiety PO.   ??? apixaban (ELIQUIS) 5 mg tablet Take one tablet by mouth twice daily. Indications: a clot in the lung   ??? buPROPion XL (WELLBUTRIN XL) 150 mg tablet Take one tablet by mouth daily. Do not crush or chew.   ??? calcium carbonate (TUMS) 500 mg (200 mg elemental calcium) chewable tablet Chew 500 mg by mouth as Needed.   ??? celecoxib (CELEBREX) 200 mg capsule Take one capsule by mouth twice daily.   ??? cyclosporine (RESTASIS) 0.05 % ophthalmic emulsion Apply one drop to both eyes twice daily. Fill largest quantity covered  Indications: keratoconjunctivitis sicca, or dry and inflamed cornea and conjunctiva of the eye   ??? docusate (COLACE) 100 mg capsule Take 100-200 mg by mouth twice daily as needed for Constipation.   ??? duloxetine DR (CYMBALTA) 60 mg capsule Take one capsule by mouth daily.   ??? ferrous sulfate (FEOSOL) 325 mg (65 mg iron) tablet Take 325 mg by mouth daily.   ??? furosemide (LASIX)  40 mg tablet Take 40 mg by mouth daily as needed (leg swelling).   ??? granisetron (KYTRIL) 1 mg tablet Take one tablet by mouth every 12 hours as needed for Nausea.   ??? HYDROcodone/acetaminophen (NORCO) 5/325 mg tablet Take 1 tablet by mouth every 6 hours as needed for Pain   ??? levothyroxine (SYNTHROID) 200 mcg tablet Take 200 mcg by mouth daily 30 minutes before breakfast.   ??? levothyroxine (SYNTHROID) 50 mcg tablet Take 50 mcg by mouth daily 30 minutes before breakfast. Takes + for a total of qday ??? losartan-hydrochlorothiazide (HYZAAR) 100-12.5 mg tablet Take 1 tablet by mouth every morning. Patient take 1/2 tablet daily.   ??? Omeprazole-Sodium Bicarbonate 20-1,680 mg pack Take 1 tablet by mouth daily.   ??? ondansetron (ZOFRAN) 8 mg tablet Take 1-2 tabs, one hour before chemotherapy and every 6 hours as needed, but no more than 3 tabs in 24 hours.   ??? polyethylene glycol 3350 (MIRALAX) 17 g packet Take one packet by mouth daily. (Patient taking differently: Take 17 g by mouth as Needed.)   ??? predniSONE (DELTASONE) 20 mg tablet Take one tablet by mouth daily with breakfast.   ??? senna/docusate (SENOKOT-S) 8.6/50 mg tablet Take 1-2 tablets by mouth daily as needed (for constipation).   ??? temozolomide (TEMODAR) 20 mg capsule Take one capsule (20 mg) by mouth twice daily with 1 other temozolomide prescription for 25 mg per dose.   ??? temozolomide (TEMODAR) 5 mg capsule Take one capsule (5 mg) by mouth twice daily with 1 other temozolomide prescription for 25 mg per dose.   ??? traMADol (ULTRAM) 50 mg tablet Take 1 tablet by mouth twice daily as needed for Pain PO. Added per 11/16/18 records from [place] pharmacy   ??? vitamins, multiple cap Take 1 capsule by mouth daily.     Vitals:    03/31/19 1319   Temp: 36.7 ???C (98 ???F)   TempSrc: Oral   PainSc: Three     There is no height or weight on file to calculate BMI.     Pain Score: Three  Pain Loc: (joints)    Fatigue Scale: 5    Pain Addressed:  N/A    Patient Evaluated for a Clinical Trial: Patient not eligible for a treatment trial (including not needing treatment, needs palliative care, in remission).     [place] Cooperative Oncology Group performance status is 2, Ambulatory and capable of all selfcare but unable to carry out any work activities. Up and about more than 50% of waking hours.  Patient is alert and oriented.  She was able to ask and answer questions appropriately.                  Assessment and Plan: Hypercoagulable state: Patient appears to be stable on apixaban.  Would plan indefinite anticoagulation for now.    GBM: Being managed by neuro oncology.    Patient will follow-up with me as needed.

## 2019-03-31 NOTE — Progress Notes
  Precertification      New Treatment  Regimen name: MRI head w/wo contrast  Number of cycles/duration of treatment planned: 1  Primary Diagnosis:Glioblastoma C71.9  Secondary Diagnosis: na  Clinical Trial: No  SNF, Hospice, or Part A Facility: No  On Pathway: Not applicable  On Compendia: Not applicable  On Medicare LCD: Not applicable  Payor: [employer] / Plan: [employer] NETWORK PPO [phone] / Product Type: PPO /     Coding/LCD-Compendia Review:  (name and date)  Authorization Complete:  Comments - (name and date)    MRI to be completed at [facility]  Date - within the next 8 weeks  Date of last MRI - 03/17/2019

## 2019-04-03 ENCOUNTER — Encounter: Admit: 2019-04-03 | Discharge: 2019-04-03

## 2019-04-04 NOTE — Progress Notes
Will precert closer to [facility]

## 2019-04-05 ENCOUNTER — Encounter: Admit: 2019-04-05 | Discharge: 2019-04-05

## 2019-04-05 NOTE — Progress Notes
Faxed additional notes to Benefit Management at [phone]

## 2019-04-06 ENCOUNTER — Encounter: Admit: 2019-04-06 | Discharge: 2019-04-06

## 2019-04-06 DIAGNOSIS — R1011 Right upper quadrant pain: Secondary | ICD-10-CM

## 2019-04-06 LAB — CBC AND DIFF

## 2019-04-06 LAB — COMPREHENSIVE METABOLIC PANEL

## 2019-04-06 NOTE — Telephone Encounter
Unable to leave message, mailbox full

## 2019-04-06 NOTE — Telephone Encounter
-----   Message from Morrie Sheldon, MD sent at 04/06/2019 11:06 AM CDT -----  normal [place]  ----- Message -----  From: Beckie Busing, RN  Sent: 04/06/2019  10:54 AM CDT  To: Morrie Sheldon, MD

## 2019-04-07 ENCOUNTER — Encounter: Admit: 2019-04-07 | Discharge: 2019-04-07

## 2019-04-07 NOTE — Telephone Encounter
Shared [place] abd results with patient, she states she is happy with results. Verbalized understanding and denies further needs.

## 2019-04-07 NOTE — Telephone Encounter
-----   Message from Morrie Sheldon, MD sent at 04/06/2019 11:06 AM CDT -----  normal [place]  ----- Message -----  From: Beckie Busing, RN  Sent: 04/06/2019  10:54 AM CDT  To: Morrie Sheldon, MD

## 2019-04-12 ENCOUNTER — Encounter: Admit: 2019-04-12 | Discharge: 2019-04-12

## 2019-04-12 DIAGNOSIS — C719 Malignant neoplasm of brain, unspecified: Secondary | ICD-10-CM

## 2019-04-13 ENCOUNTER — Encounter: Admit: 2019-04-13 | Discharge: 2019-04-13

## 2019-04-20 LAB — CBC AND DIFF

## 2019-04-21 ENCOUNTER — Encounter: Admit: 2019-04-21 | Discharge: 2019-04-21

## 2019-04-21 DIAGNOSIS — C719 Malignant neoplasm of brain, unspecified: Secondary | ICD-10-CM

## 2019-04-21 LAB — CBC AND DIFF

## 2019-04-25 ENCOUNTER — Encounter: Admit: 2019-04-25 | Discharge: 2019-04-25

## 2019-04-25 DIAGNOSIS — C719 Malignant neoplasm of brain, unspecified: Secondary | ICD-10-CM

## 2019-04-25 MED ORDER — IMS MIXTURE TEMPLATE
10 mg/m2 | Freq: Two times a day (BID) | ORAL | 0 refills | Status: CN
Start: 2019-04-25 — End: ?

## 2019-04-25 MED ORDER — TEMOZOLOMIDE 20 MG PO CAP
20 mg | ORAL_CAPSULE | Freq: Two times a day (BID) | ORAL | 0 refills | Status: DC
Start: 2019-04-25 — End: 2019-05-26

## 2019-04-25 MED ORDER — TEMOZOLOMIDE 5 MG PO CAP
5 mg | ORAL_CAPSULE | Freq: Two times a day (BID) | ORAL | 0 refills | Status: DC
Start: 2019-04-25 — End: 2019-05-26

## 2019-04-25 NOTE — Oral Chemotherapy Note
Name:Andrea [name]           MRN: [id number]                 DOB:21-Oct-1971          Age: 48 y.o.  Date of Service: 04/25/2019      Oral Chemotherapy Refill Review     Patient Information:     Correct patient/DOB/Diagnosis: Yes    Reviewed most recent clinic notes for changes in plan: Yes    Most recent labs reviewed: Yes    Appointment Information:   Date of last appointment: 01/17/2019    Follow up appointment scheduled : Yes 05/26/2019     Medication Information:     Medication name: Temozolomide    Medication in [place]: Yes     Date of last refill released from [facility]: 03/24/2019    Weight change >10%: No    Has a dose adjustment been made since last refill?No    Is Pharmacy listed in [place] treatment plan? Yes    Pharmacy: [facility]    Consent Date 12/15/2018    Patient Contact  Method of communication: MyChart  Confirmed need for refill: Yes  Patient taking medication as directed Yes  Confirmation of receiving pharmacy: Yes  Patient is anticipating a start date of: Ongoing  Other Information:     Plan was routed to Dr Coralyn Pear

## 2019-04-26 ENCOUNTER — Encounter: Admit: 2019-04-26 | Discharge: 2019-04-26

## 2019-04-26 MED ORDER — CELECOXIB 200 MG PO CAP
ORAL_CAPSULE | Freq: Two times a day (BID) | 0 refills | Status: DC
Start: 2019-04-26 — End: 2019-06-13

## 2019-04-28 NOTE — Progress Notes
Patient has been Approved for [place] [phone] Valid 04/28/2019-10/25/2019

## 2019-05-05 ENCOUNTER — Encounter: Admit: 2019-05-05 | Discharge: 2019-05-05

## 2019-05-06 ENCOUNTER — Encounter: Admit: 2019-05-06 | Discharge: 2019-05-06

## 2019-05-06 LAB — CBC AND DIFF

## 2019-05-06 NOTE — Telephone Encounter
Updated Andrea [name] that MRI order has been faxed to [facility] and she can schedule it any time before July 30

## 2019-05-09 ENCOUNTER — Encounter: Admit: 2019-05-09 | Discharge: 2019-05-09

## 2019-05-09 DIAGNOSIS — C719 Malignant neoplasm of brain, unspecified: Secondary | ICD-10-CM

## 2019-05-10 ENCOUNTER — Encounter: Admit: 2019-05-10 | Discharge: 2019-05-10

## 2019-05-10 NOTE — Telephone Encounter
Attempted to call Cornelia Copa to perform reassessment for oral chemotherapy medication.  No answer. Left voicemail asking patient to return call to the pharmacist at [phone].     Ainsley Spinner, [facility]  Oncology Clinical Pharmacist  05/10/2019

## 2019-05-12 ENCOUNTER — Encounter: Admit: 2019-05-12 | Discharge: 2019-05-12

## 2019-05-12 NOTE — Telephone Encounter
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  Cycle 1 start date was 02/04/19. The regimen of 25 mg po bid is appropriate to continue at this time.    No dose adjustments based on toxicity are required at this time.     Treatment will continue until progression or unacceptable toxicity.      See outside records for more recent labs.    CBC w diff    Lab Results   Component Value Date/Time    WBC 4.2 02/23/2019    RBC 3.28 (L) 12/23/2018 10:03 AM    HGB 11.3 (L) 12/23/2018 10:03 AM    HCT 33.5 (L) 12/23/2018 10:03 AM    MCV 102.3 (H) 12/23/2018 10:03 AM    MCH 34.4 (H) 12/23/2018 10:03 AM    MCHC 33.6 12/23/2018 10:03 AM    RDW 20.4 (H) 12/23/2018 10:03 AM    PLTCT 202 02/23/2019    MPV 6.9 (L) 12/23/2018 10:03 AM    Lab Results   Component Value Date/Time    NEUT 73 12/23/2018 10:03 AM    ANC 2.7 02/23/2019    LYMA 17 (L) 12/23/2018 10:03 AM    ALC 1.00 12/23/2018 10:03 AM    MONA 8 12/23/2018 10:03 AM    AMC 0.50 12/23/2018 10:03 AM    EOSA 1 12/23/2018 10:03 AM    AEC 0.10 12/23/2018 10:03 AM    BASA 1 12/23/2018 10:03 AM    ABC 0.00 12/23/2018 10:03 AM        Comprehensive Metabolic Profile    Lab Results   Component Value Date/Time    NA 140 12/23/2018 10:03 AM    K 3.3 (L) 12/23/2018 10:03 AM    CL 103 12/23/2018 10:03 AM    CO2 26 12/23/2018 10:03 AM    GAP 11 12/23/2018 10:03 AM    BUN 5 (L) 12/23/2018 10:03 AM    CR 0.74 12/23/2018 10:03 AM    GLU 99 12/23/2018 10:03 AM    Lab Results   Component Value Date/Time    CA 9.5 12/23/2018 10:03 AM    PO4 3.0 08/26/2018 02:44 PM    ALBUMIN 3.7 12/23/2018 10:03 AM    TOTPROT 7.2 12/23/2018 10:03 AM    ALKPHOS 34 12/23/2018 10:03 AM    AST 34 12/23/2018 10:03 AM    ALT 39 12/23/2018 10:03 AM    TOTBILI 0.4 12/23/2018 10:03 AM    GFR >60 12/23/2018 10:03 AM    GFRAA >60 12/23/2018 10:03 AM Creatinine clearance cannot be calculated (Patient's most recent lab result is older than the maximum 30 days allowed.)      Response to Therapy    The electronic medical record for Andrea [name] has been reviewed. No evidence of progression that would necessitate a change of therapy has been identified. Patient will continue therapy as she is achieving therapeutic benefit.       Adverse Effects Assessment    Andrea [name] is having the following adverse effects: (1) occasional nausea managed by Kytril; (2) feels tired all the time; (3) joint pain (all over), back pain and leg muscle pain that she is working with her PCP to manage.  This is probably her biggest concern right now - the pain seems to be getting worse in the past 1-2 months and it keeps her up at night.  She has tried non-Rx things  such as buying a new bed and using hot pads on her back.  She will take her pain meds if the pain is severe enough although she does not like feeling loopy from the pain meds.  She is more likely to take them at night to help with sleep.  I encouraged her to keep working with her PCP to try to rule out other causes - pain is not a common side effect from Temodar. 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      None of the adverse effects were severe enough to interfere with adherence.     Adherence Assessment    The patient's ability to self-administer medication was assessed.     Andrea [name] reports missing possibly 1 or 2 doses over the past 3 month(s) not related to toxicity. Andrea [name] was re-educated on importance of adherence.     Medication Reconciliation    A medication history and reconciliation was performed (including prescription medications, supplements, over the counter medications, and herbal products). The medication list was updated and the patient???s current medication list is included below.     Prior to Admission medications Medication Sig Start Date End Date Taking? Authorizing Provider   acetaminophen (TYLENOL) 325 mg tablet Take two tablets by mouth every 6 hours as needed for Pain. 09/09/18  Yes Arickx, Kandace Parkins, MD   ALPRAZolam Prudy Feeler) 0.5 mg tablet Take 1 tablet by mouth three times daily as needed for Anxiety PO. 09/20/18  Yes HISTORICAL PROVIDER   apixaban (ELIQUIS) 5 mg tablet Take one tablet by mouth twice daily. Indications: a clot in the lung 12/23/18  Yes Beeki, Darcus Pester, MD   buPROPion XL (WELLBUTRIN XL) 150 mg tablet Take one tablet by mouth daily. Do not crush or chew. 09/10/18  Yes Arickx, Kandace Parkins, MD   calcium carbonate (TUMS) 500 mg (200 mg elemental calcium) chewable tablet Chew 500 mg by mouth as Needed.   Yes HISTORICAL PROVIDER   celecoxib (CELEBREX) 200 mg capsule Take 1 capsule by mouth twice daily 04/26/19  Yes Bartholome Bill, MD   cyclosporine (RESTASIS) 0.05 % ophthalmic emulsion Apply one drop to both eyes twice daily. Fill largest quantity covered  Indications: keratoconjunctivitis sicca, or dry and inflamed cornea and conjunctiva of the eye 11/05/18  Yes Corky Sox, MD   docusate (COLACE) 100 mg capsule Take 100-200 mg by mouth twice daily as needed for Constipation.   Yes Provider, Historical   duloxetine DR (CYMBALTA) 60 mg capsule Take one capsule by mouth daily. 09/09/18  Yes Arickx, Kandace Parkins, MD   furosemide (LASIX) 40 mg tablet Take 40 mg by mouth daily as needed (leg swelling).   Yes Provider, Historical   granisetron (KYTRIL) 1 mg tablet Take one tablet by mouth every 12 hours as needed for Nausea. 12/29/18  Yes Bartholome Bill, MD   HYDROcodone/acetaminophen [facility]) 5/325 mg tablet Take 1 tablet by mouth every 6 hours as needed for Pain   Yes HISTORICAL PROVIDER   levothyroxine (SYNTHROID) 200 mcg tablet Take 200 mcg by mouth daily 30 minutes before breakfast.   Yes Provider, Historical   losartan-hydrochlorothiazide (HYZAAR) 100-12.5 mg tablet Take 1 tablet by mouth every morning. Patient take 1/2 tablet daily.   Yes Provider, Historical   polyethylene glycol 3350 (MIRALAX) 17 g packet Take one packet by mouth daily.  Patient taking differently: Take 17 g by mouth as Needed. 09/10/18  Yes Arickx, Kandace Parkins, MD   temozolomide (TEMODAR) 20 mg capsule Take one capsule (20 mg) by mouth twice daily with 1 other temozolomide prescription for 25 mg per dose. 04/25/19  Yes Bartholome Bill, MD   temozolomide [facility]) 5 mg capsule Take one capsule (5 mg) by mouth twice daily with 1 other temozolomide prescription for 25 mg per dose. 04/25/19  Yes Bartholome Bill, MD   vitamins, multiple cap Take 1 capsule by mouth daily.   Yes Provider, Historical       Drug-drug and drug-food interactions between the patients??? specialty medication and their medication list were assessed and reviewed with the patient.     No significant drug-drug interactions were identified.     Ms. Beilfuss was instructed to speak with her health care provider and/or the oral chemotherapy pharmacist before starting any new drug, including prescription or over the counter, natural / herbal products, or vitamins.      Allergies   Allergen Reactions   ??? Morphine ANAPHYLAXIS   ??? Codeine RASH   ??? Erythromycin SHORTNESS OF BREATH   ??? Latex RASH   ??? Nsaids (Non-Steroidal Anti-Inflammatory Drug) SEE COMMENTS     History of GI bleed - has been instructed to avoid NSAID's       Vaccination Status Assessment   Immunization History   Administered Date(s) Administered   ??? DTaP vaccine IM (Infanrix) 07/31/1970, 02/21/1971, 03/18/1971, 03/16/1972   ??? Flu Vaccine =>6 Months Quadrivalent PF 09/04/2018   ??? Flu vaccine, inj unspecified (Historical) 09/04/2018   ??? HEPATITIS B vaccine, unspecified (Historical) 09/15/1989, 10/29/1989, 11/26/1990, 06/11/2004   ??? MMR Vaccine 05/01/1971, 05/13/1975, 12/25/2004   ??? OPV 07/31/1970, 02/21/1971, 02/22/1972, 01/01/1983   ??? Td vaccine, unspecified (Historical) 02/05/1992, 06/05/2003 ??? Tdap Vaccine 04/23/2017       Appropriate recommended vaccinations were reviewed and discussed with the patient. The patient will be reminded about the importance of receiving an annual influenza vaccine as indicated.      Reproductive Risk Assessment    Ms. Sampat is a 48 y.o. female    As patient is a female not of child-bearing potential, education was not applicable.     Risk Evaluation and Mitigation Strategy (REMS) Assessment    No REMS is required for this medication.    What to do with any unused or expired medications    Appropriate safe handling and disposal procedures were reviewed with the patient. Ms. Kocian was instructed to return any unused or expired oral chemotherapy medication to a designated disposal bin at one of the [facility] locations or to utilize a community drug take back program.  Instructed not to flush down the toilet or to crush the medication.      Follow-up Plan     Ms. Wickey was encouraged to call the oral chemotherapy pharmacist at [phone] with questions. This medication is considered high risk per our internal oral chemotherapy risk categorization and the patient will be contacted for education, toxicity check at 2 weeks, and reassessment every 3 months, if applicable (high risk monitoring).      Re-assessment has been completed. Next reassessment planned for 3 month(s).     Brent Bulla, [facility]  Oncology Clinical Pharmacist  05/12/2019

## 2019-05-16 ENCOUNTER — Encounter: Admit: 2019-05-16 | Discharge: 2019-05-17

## 2019-05-16 ENCOUNTER — Encounter: Admit: 2019-05-16 | Discharge: 2019-05-16

## 2019-05-16 IMAGING — CR CHEST
2 series · 2 of 2 positions shown · non-contrast
Comparison: none

[chest pa x-wise]
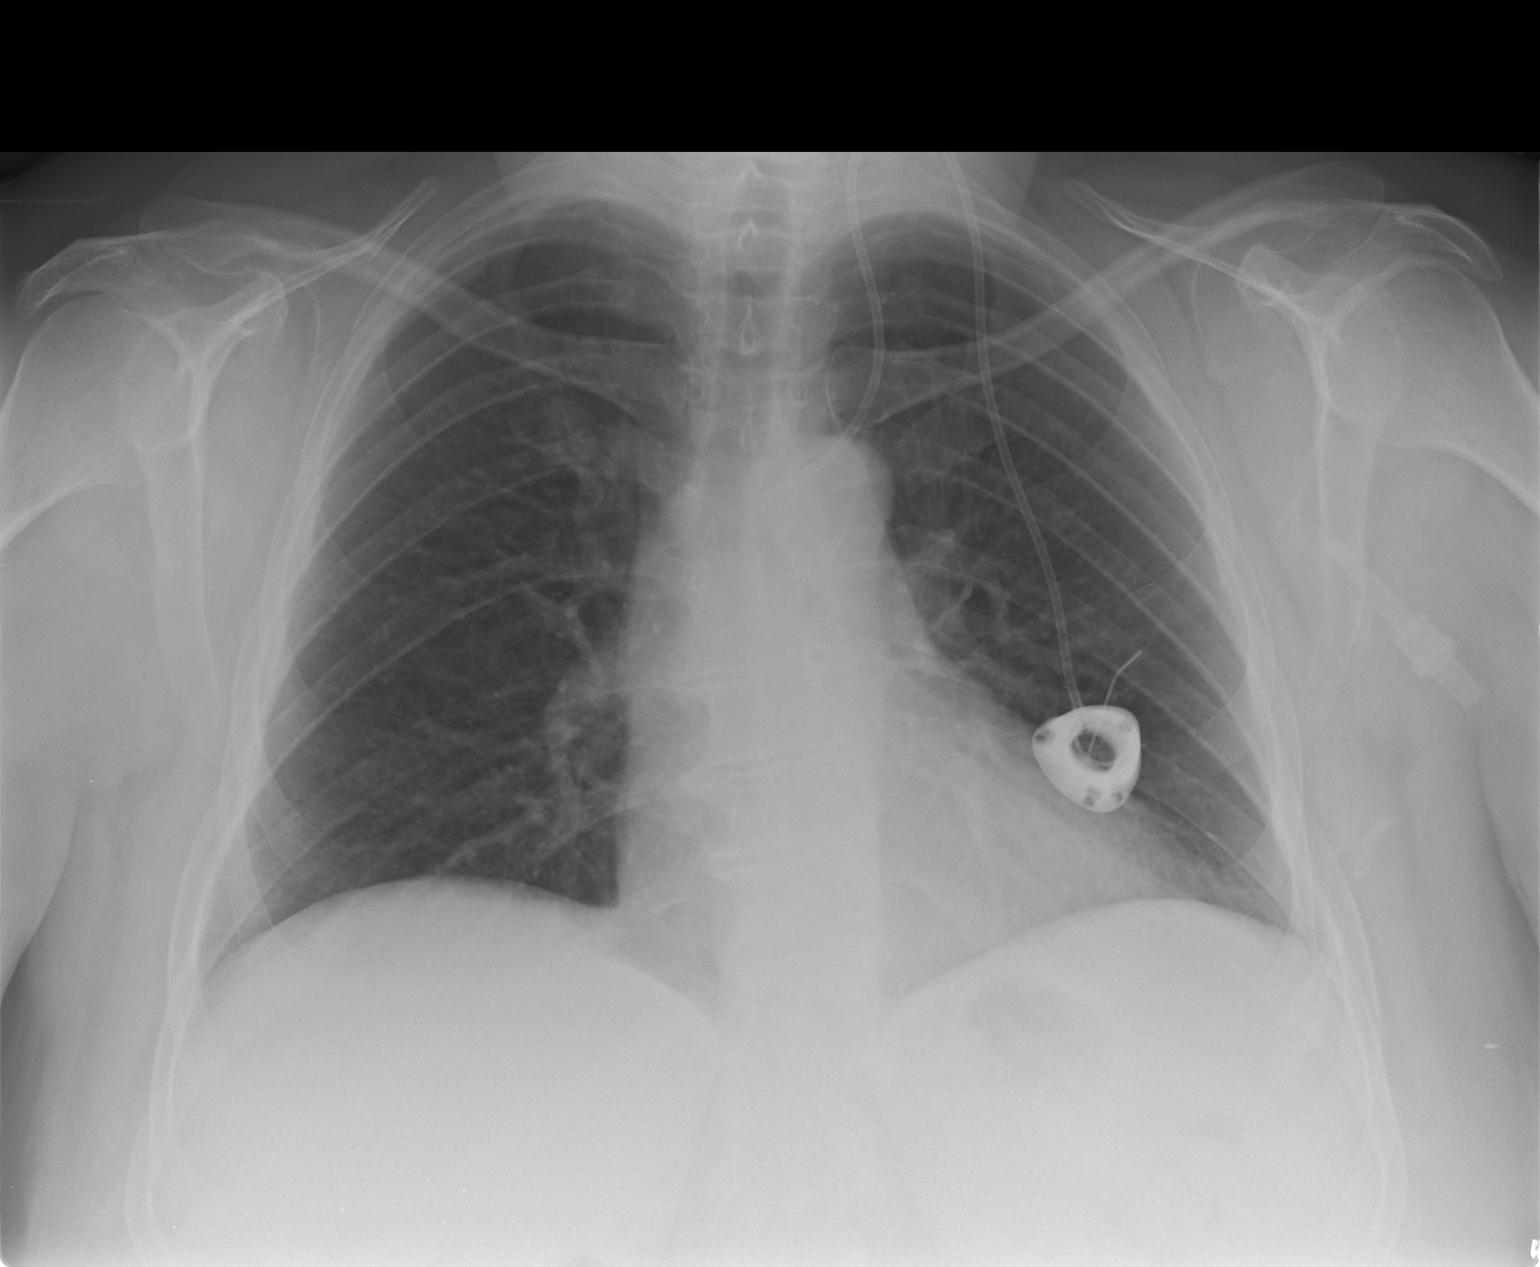

[chest lat]
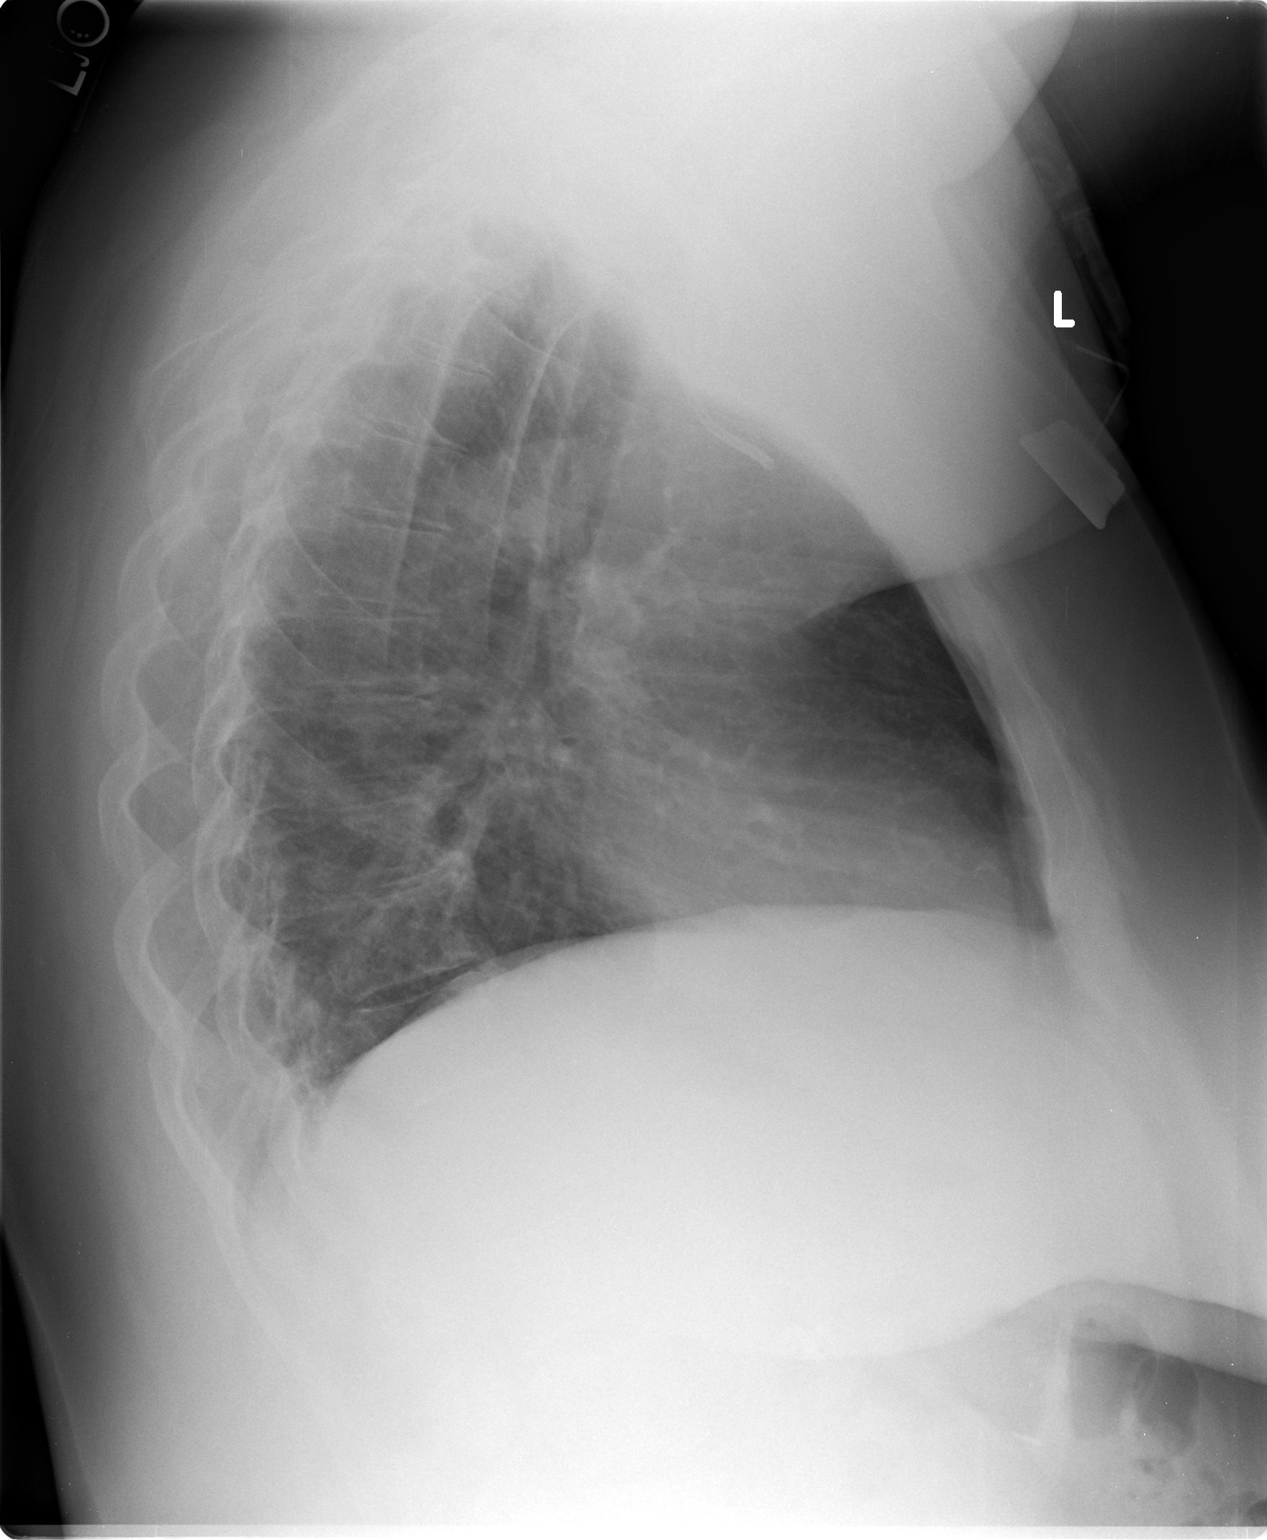

[2 of 2 positions shown; findings below may reference images not displayed]

DIAGNOSTIC STUDIES

EXAM

RADIOLOGICAL EXAMINATION, CHEST; 2 VIEWS FRONTAL AND LATERAL CPT 35090

INDICATION

Shortness of air

TECHNIQUE

2 views of the chest were acquired.

COMPARISONS

Chest radiograph February 24, 2019

FINDINGS

The cardiac silhouette is within normal limits for size. The mediastinum is not widened or
deviated. The lungs are clear and the costophrenic sulci are sharp. Pulmonary vasculature is normal
caliber. No pneumothorax is identified. Left chest port catheter unchanged.

IMPRESSION

No acute cardiopulmonary process.

Tech Notes:

PT C/O SOA. COUGH. HX OF GLIOBLASTOMA. TJ

## 2019-05-23 ENCOUNTER — Encounter: Admit: 2019-05-23 | Discharge: 2019-05-24

## 2019-05-23 LAB — CBC AND DIFF

## 2019-05-23 IMAGING — MR Head^Brain
11 of 12 series · 44 of 48 positions shown · IV contrast (with contrast)
Comparison: none

[Series 2: T1 · sagittal · 5.0mm · 0.45mm/px · 3 of 20 slices shown (1 of 2)]
[im 1/20]
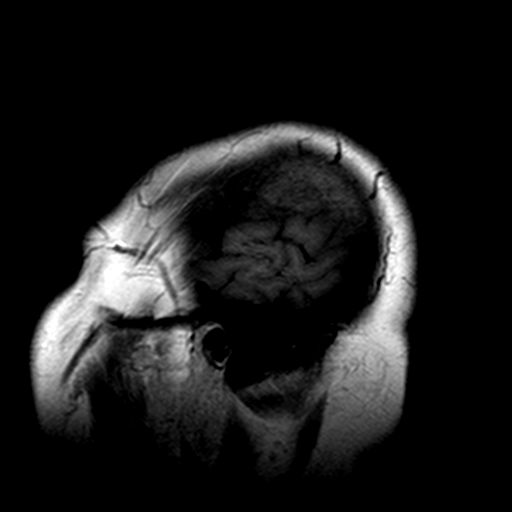
[im 10/20]
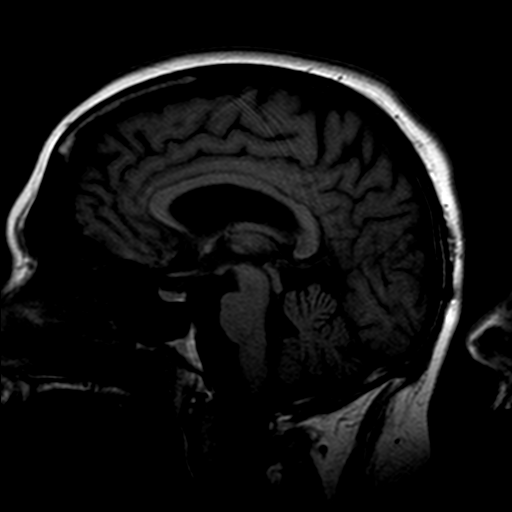
[im 20/20]
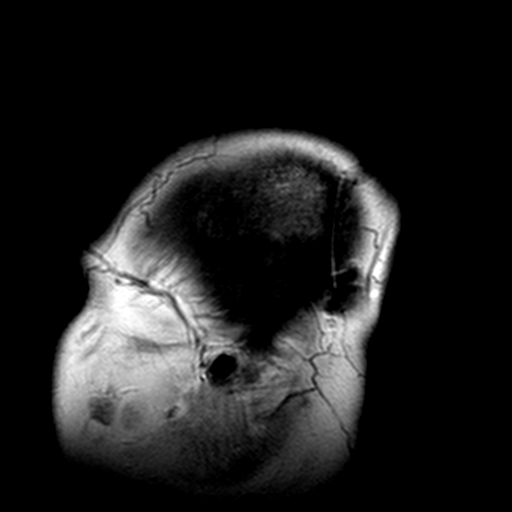

[Series 3: DWI · axial · 5.0mm · 1.80mm/px · z∈[-86,+41]mm · 9 of 61 slices shown (1 of 2)]
[im 1/61]
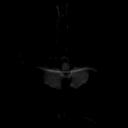
[im 8/61]
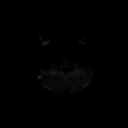
[im 16/61]
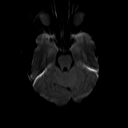
[im 23/61]
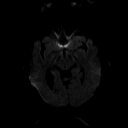
[im 31/61]
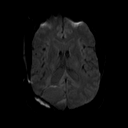
[im 38/61]
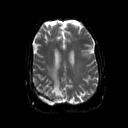
[im 46/61]
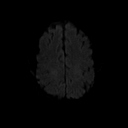
[im 53/61]
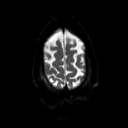
[im 61/61]
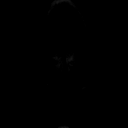

[Series 4: DWI · axial · 5.0mm · 1.80mm/px · z∈[-86,+41]mm · 3 of 21 slices shown (2 of 2)]
[im 1/21]
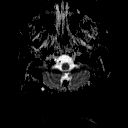
[im 11/21]
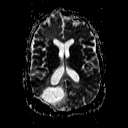
[im 21/21]
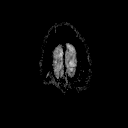

[Series 5: FLAIR · axial · 5.0mm · 0.45mm/px · z∈[-100,+47]mm · 4 of 24 slices shown]
[im 1/24]
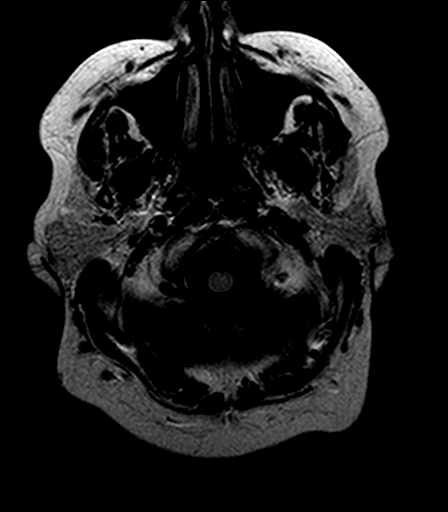
[im 8/24]
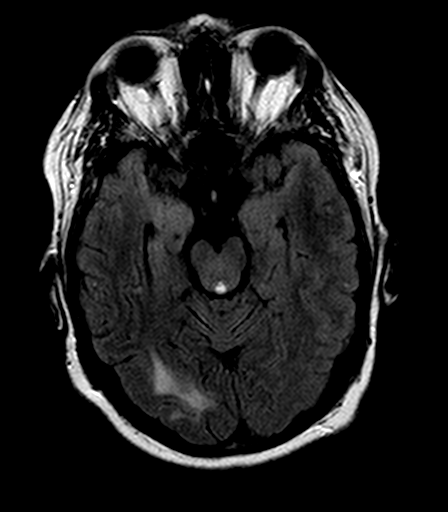
[im 16/24]
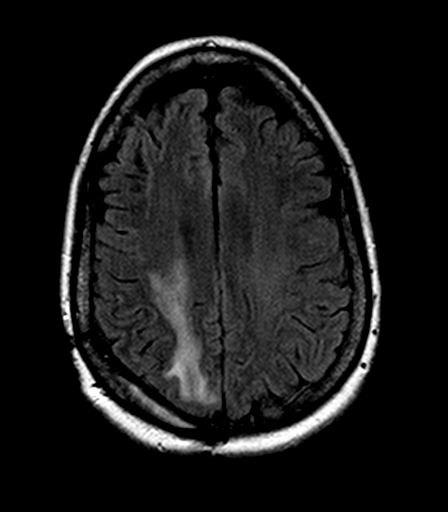
[im 24/24]
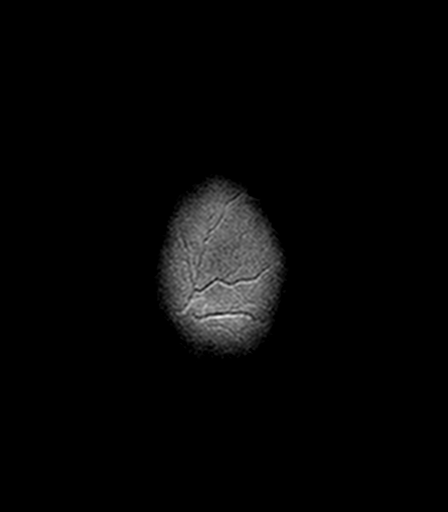

[Series 6: T2 · axial · 5.0mm · 0.72mm/px · z∈[-100,+47]mm · 3 of 22 slices shown (1 of 3)]
[im 1/22]
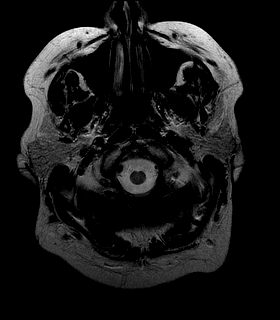
[im 11/22]
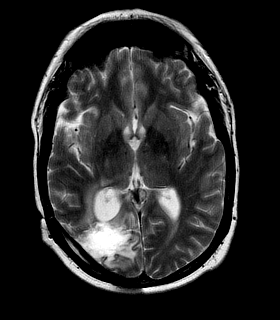
[im 22/22]
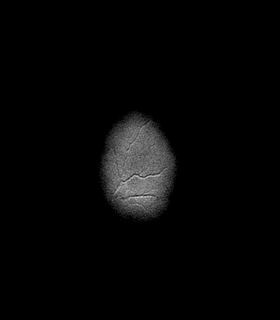

[Series 7: T1 · axial · 5.0mm · 0.45mm/px · z∈[-100,+47]mm · 4 of 23 slices shown (2 of 2)]
[im 1/23]
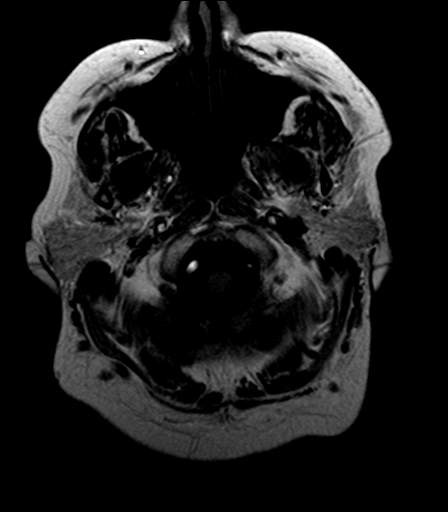
[im 8/23]
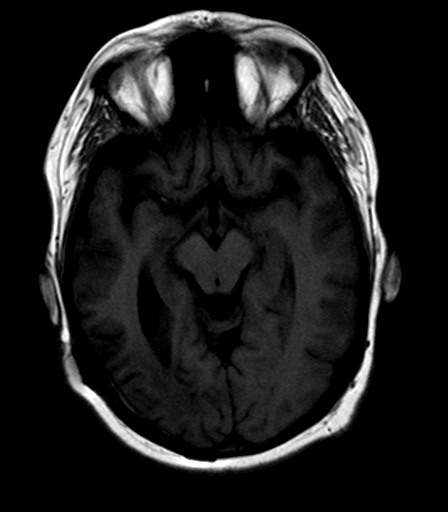
[im 15/23]
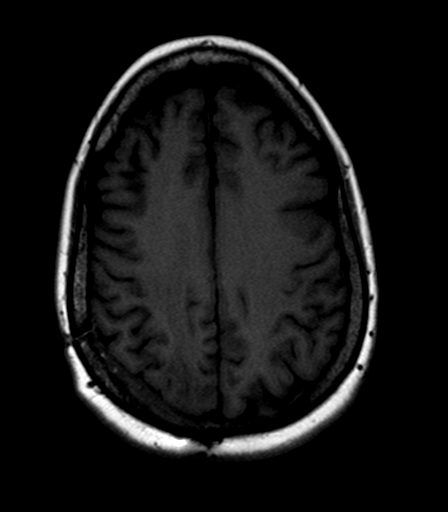
[im 23/23]
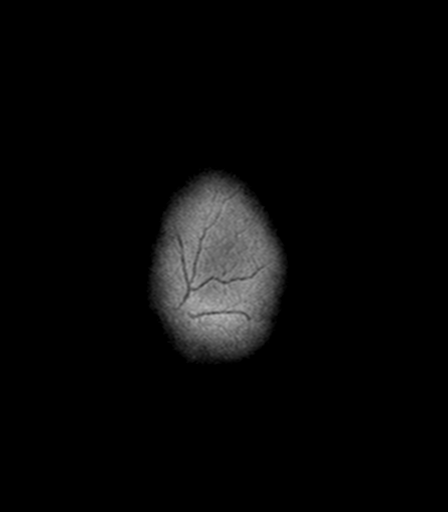

[Series 8: axial blood · axial · 5.0mm · 0.45mm/px · z∈[-90,-26]mm · 2 of 21 slices shown]
[im 1/21]
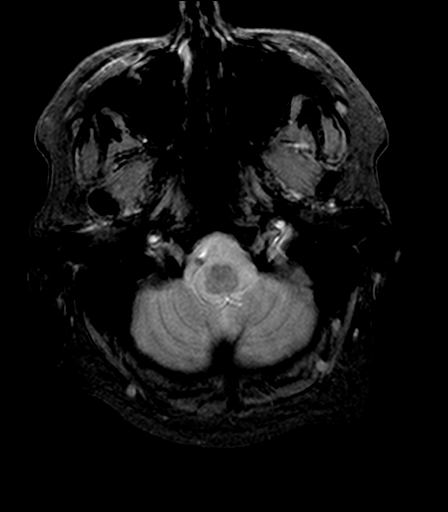
[im 11/21]
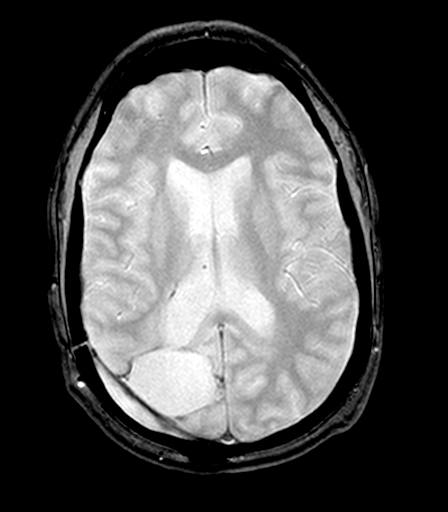

[Series 9: T2 · coronal · 5.0mm · 0.69mm/px · 4 of 26 slices shown (2 of 3)]
[im 1/26]
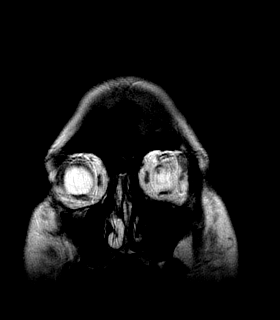
[im 9/26]
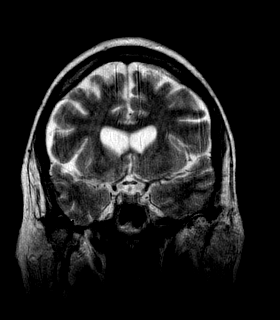
[im 17/26]
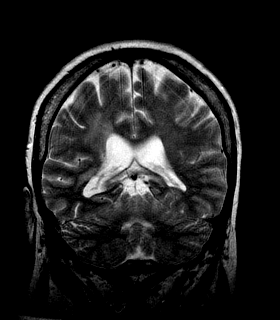
[im 26/26]
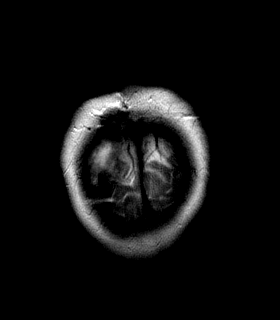

[Series 11: T2 · coronal · 5.0mm · 0.69mm/px · 4 of 26 slices shown (3 of 3)]
[im 1/26]
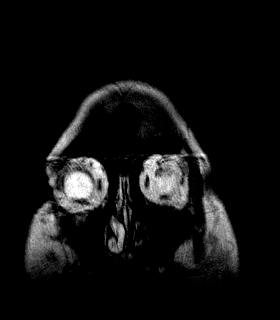
[im 9/26]
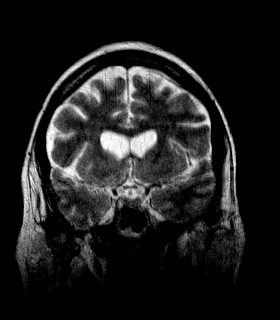
[im 17/26]
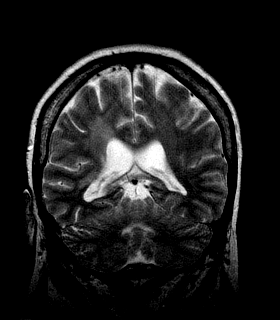
[im 26/26]
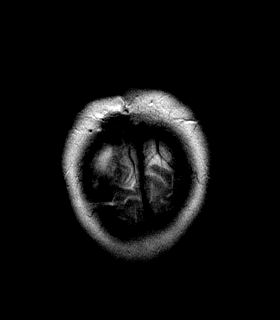

[Series 13: T1 fat-sat post-contrast · axial · 5.0mm · 0.90mm/px · z∈[-62,+83]mm · 4 of 24 slices shown (1 of 2)]
[im 1/24]
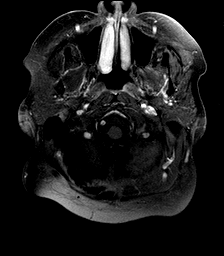
[im 8/24]
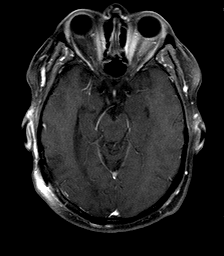
[im 16/24]
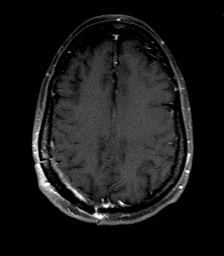
[im 24/24]
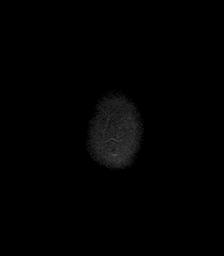

[Series 14: T1 fat-sat post-contrast · coronal · 5.0mm · 0.90mm/px · 4 of 26 slices shown (2 of 2)]
[im 1/26]
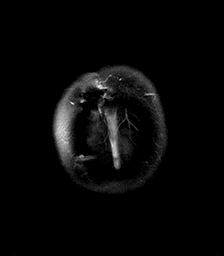
[im 9/26]
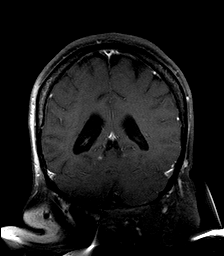
[im 17/26]
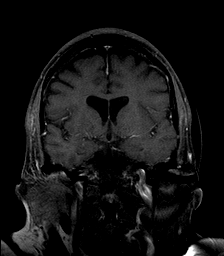
[im 26/26]
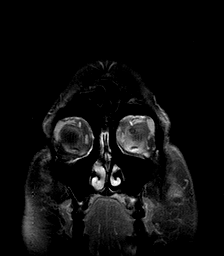

[44 of 48 positions shown; findings below may reference images not displayed]

EXAM

MRI brain without and with contrast.

INDICATION

Glioblastoma-see notes in comment
HEADACHE X 2 WEEKS, HX GLIOBLASTOMA UNDER TREATMENT, CHEMO AT THIS TIME.  15 ML GADAVIST

FINDINGS

The prior study was reviewed from 03/18/2019.

Sagittal T1 and axial T1, T2, FLAIR, gradient echo and diffusion weighted images of the brain were
obtained.  Axial and coronal post-contrast T1 weighted images were obtained after an IV dose 15 cc
of contrast.

There are postop changes of the right cerebral hemisphere posteriorly with a craniotomy defect and
underlying rounded area of fluid with a thin enhancing rim.  This appears unchanged. There is
residual vasogenic edema throughout the right parietal and occipital lobes adjacent to the area of
encephalomalacia. This extends to the left of the midline and across corpus callosum, also
unchanged. There is adjacent meningeal enhancement which is unchanged.  The area of encephalomalacia
at the site of prior tumor section measures 4.1 x 2.9 cm in transverse and AP diameter.  Previously
measured 4.2 x 3.3 cm in diameter.

There are no new lesions.

IMPRESSION

There postop change in with craniotomy defect in the right parietal lobe with a cystic area of
encephalomalacia, slightly decreased in size since prior study. This continues to have a thin
enhancing rim with adjacent meningeal enhancement.

Vasogenic edema within the adjacent portion of the right parietal and occipital lobes and involving
the splenium of the corpus callosum and adjacent portion left parietal and occipital lobe appears
unchanged.

Tech Notes:

HEADACHE X 2 WEEKS, HX GLIOBLASTOMA UNDER TREATMENT, CHEMO AT THIS TIME.  15 ML GADAVIST

## 2019-05-24 ENCOUNTER — Encounter: Admit: 2019-05-24 | Discharge: 2019-05-24

## 2019-05-25 ENCOUNTER — Encounter: Admit: 2019-05-25 | Discharge: 2019-05-25

## 2019-05-25 DIAGNOSIS — C719 Malignant neoplasm of brain, unspecified: Secondary | ICD-10-CM

## 2019-05-26 ENCOUNTER — Encounter: Admit: 2019-05-26 | Discharge: 2019-05-26

## 2019-05-26 DIAGNOSIS — F909 Attention-deficit hyperactivity disorder, unspecified type: Secondary | ICD-10-CM

## 2019-05-26 DIAGNOSIS — F329 Major depressive disorder, single episode, unspecified: Secondary | ICD-10-CM

## 2019-05-26 DIAGNOSIS — E079 Disorder of thyroid, unspecified: Secondary | ICD-10-CM

## 2019-05-26 DIAGNOSIS — C719 Malignant neoplasm of brain, unspecified: Secondary | ICD-10-CM

## 2019-05-26 DIAGNOSIS — I10 Essential (primary) hypertension: Secondary | ICD-10-CM

## 2019-05-26 MED ORDER — TEMOZOLOMIDE 5 MG PO CAP
5 mg | ORAL_CAPSULE | Freq: Two times a day (BID) | ORAL | 0 refills | Status: DC
Start: 2019-05-26 — End: 2019-06-16

## 2019-05-26 MED ORDER — TEMOZOLOMIDE 20 MG PO CAP
20 mg | ORAL_CAPSULE | Freq: Two times a day (BID) | ORAL | 0 refills | Status: DC
Start: 2019-05-26 — End: 2019-06-16

## 2019-05-26 MED ORDER — IMS MIXTURE TEMPLATE
10 mg/m2 | Freq: Two times a day (BID) | ORAL | 0 refills | Status: CN
Start: 2019-05-26 — End: ?

## 2019-05-26 NOTE — Progress Notes
Will intiate closer to [facility]

## 2019-05-26 NOTE — Oral Chemotherapy Note
Name:Andrea [name]           MRN: [id number]                 DOB:05-25-1971          Age: 48 y.o.  Date of Service: 05/26/2019      Oral Chemotherapy Refill Review     Patient Information:     Correct patient/DOB/Diagnosis: Yes    Reviewed most recent clinic notes for changes in plan: Yes    Most recent labs reviewed: Yes    Appointment Information:   Date of last appointment: 05/26/2019    Follow up appointment scheduled : Yes Sent to schedulers      Medication Information:     Medication name: Temozolomide    Medication in [place]: Yes     Date of last refill released from [facility]: 04/25/2019    Weight change >10%: No    Has a dose adjustment been made since last refill?No    Is Pharmacy listed in [place] treatment plan? Yes    Pharmacy: [facility]    Consent Date 12-15-2018    Patient Contact  Method of communication: in-office  Confirmed need for refill: Yes  Patient taking medication as directed Yes  Confirmation of receiving pharmacy: Yes  Patient is anticipating a start date of: Ongoing    Other Information:     Plan was routed to Dr Coralyn Pear

## 2019-05-26 NOTE — Progress Notes
New Treatment  Regimen name:MRI head w/wo contrast  Number of cycles/duration of treatment planned:1  Primary Diagnosis:Glioblastoma C71.9  Secondary Diagnosis:na  Clinical Trial:No  SNF, Hospice, or Part A Facility:No  On Pathway:Not applicable  On Compendia:Not applicable  On Medicare LCD:Not applicable  Payor: [employer] / Plan: [employer] NETWORK PPO - [id number] / Product Type: PPO /    Coding/LCD-Compendia Review: (name and date)  Authorization Complete: Comments - (name and date)    MRI to be completed at [facility]  Date - within the next 8 weeks

## 2019-05-26 NOTE — Progress Notes
[facility]  Brain and Spine Tumor Program        Neuro-oncology Clinic Progress Note    Patient Name: Andrea [name]  DOB: 1971/05/18  MRN: [id number]  DOS: 05/26/2019      Subjective:   Andrea [name] is a 48 y.o. 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    Reason For Visit:  Neuro-Oncology Care    Treatment History:  08/26/18: Presented to the ED after becoming lost while driving to her daughter's house which is 3 minutes from her home. She reports a headache for about 2 weeks which became gradually worse and she attributed to sinus problems. She states she has felt foggy lately, fatigued and her memory has been poor. Her family reports she has been running into things around the house, she drove on the wrong side of the road, forgot to go to work, and has been jumbling her words and is confused.  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 A CT head showed a large right posterior cerebrum lesion and she was transferred to [facility] for a higher level of care.  ???  08/26/18: CT Head - Large masslike process throughout the right posterior cerebrum with probably associated hemorrhage. Vasogenic edema with diffuse sulcal effacement and subfalcine herniation. ???Suggestive diffuse thickening and increased attenuation throughout the right cerebral cortex/inter up. ???  ???  08/26/18: MRI Head - ???Large cerebral necrotic enhancing mass centered within the right posterior cerebral hemisphere, favored for high-grade glioma. Cystic metastasis is considered less likely. Localized mass effect results in right hemispheric sulcal effacement, 7 mm leftward shift, and early right uncal herniation. Moderate FLAIR hyperintensity surrounding the mass and involving the bilateral rostral callosum, which may represent a combination of vasogenic edema and/or tumor elements.  ??? 08/26/18: CT CAP - CHEST: No discrete primary malignancy or thoracic metastatic disease. At least mild coronary artery calcification. ABDOMEN AND PELVIS: No discrete primary malignancy or abdominopelvic metastatic disease. Probable left ovarian cyst, incompletely characterized. Small fat containing supraumbilical hernia.  ???  08/27/18: Right craniotomy -  Path: High grade glioma with necrosis, mitoses and mvp.IDH1 negative by ihc, MGMT pending  ???  08/28/18: MRI Head - Interval right parietal craniotomy and mass resection, with expected hemorrhage within the operative cavity. There is mild postoperative pneumocephalus and a relatively small amount of postoperative gas, fluid, and blood products deep to the craniotomy. Residual peripheral enhancement along the anterior inferior origin of the resection bed, most compatible with residual tumor   Improvement in right cerebral mass effect with improving leftward shift and sulcal effacement, with resolved or nearly uncal herniation. No significant change in FLAIR signal abnormality within the right posterior cerebral hemisphere and along the bilateral rostral callosum  ???  08/31/18: Admitted to inpatient rehab at [place] with tentative DC date 09/15/18    Interval History:  Andrea [name] is seen today for telehealth visit. Her fatigue continues although improved. 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         Review of Systems    ROS is conducted in all 12 systems and, except as noted above in HPI, is summarized below:    No headaches, no vision changes.  No difficulty with speech or swallowing.  No chest pain, back pain,  no breathing difficulties.  No abdominal pain.  No extremity edema  No neurologic changes, specifically weakness or sensory changes  No recent seizures, no recent falls  No fevers, chills or sweats. No cough No nausea, vomiting, diarrhea or constipation  No unintended weight loss.  No excessive fatigue.  No skin changes or rash  No depression complaints      Past Medical History:     Medical History:   Diagnosis Date   ??? ADHD    ??? Depression    ??? Disorder of thyroid gland    ??? Hypertension      Surgical History:   Procedure Laterality Date   ??? Right craniotomy for resection of tumor Right 08/27/2018    Performed by Theotis Barrio, MD at [facility] OR   ??? MICROSURGICAL TECHNIQUES - REQUIRING OPERATING MICROSCOPE USE N/A 08/27/2018    Performed by Theotis Barrio, MD at [facility] OR   ??? STEREOTACTIC COMPUTER-ASSISTED CRANIAL PROCEDURE - INTRADURAL N/A 08/27/2018    Performed by Theotis Barrio, MD at [facility] OR   ??? ULTRASOUND GUIDANCE - INTRAOPERATIVE  08/27/2018    Performed by Theotis Barrio, MD at [facility] OR   ??? CRANIECTOMY/ BONE FLAP CRANIOTOMY EXCISION SUPRATENTORIAL BRAIN TUMOR Right 08/28/2018    Performed by Theotis Barrio, MD at [facility] OR   ??? CHOLECYSTECTOMY     ??? HX APPENDECTOMY     ??? HX HYSTERECTOMY     ??? HX JOINT REPLACEMENT     ??? HYSTERECTOMY       Social History     Socioeconomic History   ??? Marital status: Single     Spouse name: Not on file   ??? Number of children: 1   ??? Years of education: Not on file   ??? Highest education level: Not on file   Occupational History   ??? Not on file   Tobacco Use   ??? Smoking status: Current Some Day Smoker     Types: Cigarettes   ??? Smokeless tobacco: Never Used   Substance and Sexual Activity   ??? Alcohol use: Never     Frequency: Never   ??? Drug use: Never   ??? Sexual activity: Not on file   Other Topics Concern   ??? Not on file   Social History Narrative   ??? Not on file       Current Medications:  ??? acetaminophen (TYLENOL) 325 mg tablet Take two tablets by mouth every 6 hours as needed for Pain.   ??? ALPRAZolam (XANAX) 0.5 mg tablet Take 1 tablet by mouth three times daily as needed for Anxiety PO.   ??? apixaban (ELIQUIS) 5 mg tablet Take one tablet by mouth twice daily. Indications: a clot in the lung ??? buPROPion XL (WELLBUTRIN XL) 150 mg tablet Take one tablet by mouth daily. Do not crush or chew.   ??? calcium carbonate (TUMS) 500 mg (200 mg elemental calcium) chewable tablet Chew 500 mg by mouth as Needed.   ??? celecoxib (CELEBREX) 200 mg capsule Take 1 capsule by mouth twice daily   ??? cyclosporine (RESTASIS) 0.05 % ophthalmic emulsion Apply one drop to both eyes twice daily. Fill largest quantity covered  Indications: keratoconjunctivitis sicca, or dry and inflamed cornea and conjunctiva of the eye   ??? dexAMETHasone (DECADRON) 4 mg tablet Take 4 mg by mouth twice daily. Take with food.   ??? docusate (COLACE) 100 mg capsule Take 100-200 mg by mouth twice daily as needed for Constipation.   ??? duloxetine DR (  CYMBALTA) 60 mg capsule Take one capsule by mouth daily.   ??? furosemide (LASIX) 40 mg tablet Take 40 mg by mouth daily as needed (leg swelling).   ??? granisetron (KYTRIL) 1 mg tablet Take one tablet by mouth every 12 hours as needed for Nausea.   ??? HYDROcodone/acetaminophen (NORCO) 5/325 mg tablet Take 1 tablet by mouth every 6 hours as needed for Pain   ??? levothyroxine (SYNTHROID) 200 mcg tablet Take 200 mcg by mouth daily 30 minutes before breakfast.   ??? losartan-hydrochlorothiazide (HYZAAR) 100-12.5 mg tablet Take 1 tablet by mouth every morning. Patient take 1/2 tablet daily.   ??? polyethylene glycol 3350 (MIRALAX) 17 g packet Take one packet by mouth daily. (Patient taking differently: Take 17 g by mouth as Needed.)   ??? temozolomide (TEMODAR) 20 mg capsule Take one capsule (20 mg) by mouth twice daily with 1 other temozolomide prescription for 25 mg per dose.   ??? temozolomide (TEMODAR) 5 mg capsule Take one capsule (5 mg) by mouth twice daily with 1 other temozolomide prescription for 25 mg per dose.   ??? vitamins, multiple cap Take 1 capsule by mouth daily.       Allergies:  Allergies   Allergen Reactions   ??? Morphine ANAPHYLAXIS   ??? Codeine RASH   ??? Erythromycin SHORTNESS OF BREATH   ??? Latex RASH ??? Nsaids (Non-Steroidal Anti-Inflammatory Drug) SEE COMMENTS     History of GI bleed - has been instructed to avoid NSAID's       Vitals:  Vitals:    05/26/19 1058   PainSc: Zero     There is no height or weight on file to calculate BSA.    Exam (telehealth visit)    ECOG PS = 2-3 (based on video observations/reports)    Pain = 0/10    Well nourished, well developed, in no acute distress, sequelae of steroid use noted  Mood and affect appear appropriate for the situation  Cognition appears intact, language is intact  Ambulatory at home    CN grossly intact 2-12 with the exception of dense hemianopsia  Strength appears intact in all 4 extremities  Balance (ambulation) is intact    Laboratory Results:        Assessment and Plan:  Patient Active Problem List    Diagnosis Date Noted   ??? Glioblastoma (HCC) 09/13/2018     Priority: High   ??? 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 Impaired mobility and ADLs 08/31/2018   ??? Impaired cognition 08/31/2018   ??? HTN (hypertension) 08/31/2018   ??? Hypothyroid 08/31/2018   ??? 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  Coronavirus is limiting activity.  Dense left hemianopsia persists.  Her brief treatment history includes presentation with a few weeks of progressive confusion, bumping into things.  Imaging demonstrates a right inferior parietal enhancing mass with surrounding edema.  She underwent radiation with concurrent temozolomide (no depakote) ending in early January 2020.  Began adjuvant temolomide, adjusted to BID dosed due to her comorbidities.  We reviewed her MRI scans from 05-23-2019 vs comparisons from 03-17-2019 and 11-02-2018, overall the right parietal/occipital area surgical cavity is unchanged as is the minimal linear enhancement and surrounding FLAIR.  She is tolerating reduced dose (BID dosed) temozolomide much better than 5-day, for now will continue with plan, RTC in about 8 weeks with repeat MRI scan, sooner if problems. Encouraged increasing activity level, discussed PT referral and will continue steroid taper, hopefully to off.      I spent a total of 30 minutes today in direct patient evaluation, from 1100 to 1130, with more than 20 minutes spent in treatment plan formulation, symptom management discussion and consultation as outlined above.         Bartholome Bill, MD  Director, Brain and Spine Tumor Program   [facility]  p. [phone]    f. [phone]

## 2019-05-26 NOTE — Progress Notes
RTC with MRI in at [facility] in 8 weeks.   Precert started- will fax MRI order to OSH.   Educated patient to take 2mg  in the morning until Monday call us then if ok we can stop.

## 2019-06-06 ENCOUNTER — Encounter: Admit: 2019-06-06 | Discharge: 2019-06-06

## 2019-06-06 DIAGNOSIS — C719 Malignant neoplasm of brain, unspecified: Secondary | ICD-10-CM

## 2019-06-06 NOTE — Progress Notes
Faxed paperwork to [employer]  Fax number [phone]

## 2019-06-11 ENCOUNTER — Encounter: Admit: 2019-06-11 | Discharge: 2019-06-11

## 2019-06-13 MED ORDER — CELECOXIB 200 MG PO CAP
ORAL_CAPSULE | Freq: Two times a day (BID) | 0 refills | Status: DC
Start: 2019-06-13 — End: 2019-07-29

## 2019-06-14 ENCOUNTER — Encounter: Admit: 2019-06-14 | Discharge: 2019-06-14 | Payer: Commercial Managed Care - PPO

## 2019-06-15 LAB — CBC AND DIFF

## 2019-06-16 ENCOUNTER — Encounter: Admit: 2019-06-16 | Discharge: 2019-06-16

## 2019-06-16 DIAGNOSIS — C719 Malignant neoplasm of brain, unspecified: Secondary | ICD-10-CM

## 2019-06-16 MED ORDER — TEMOZOLOMIDE 5 MG PO CAP
5 mg | ORAL_CAPSULE | Freq: Two times a day (BID) | ORAL | 0 refills | Status: DC
Start: 2019-06-16 — End: 2019-07-21

## 2019-06-16 MED ORDER — TEMOZOLOMIDE 20 MG PO CAP
20 mg | ORAL_CAPSULE | Freq: Two times a day (BID) | ORAL | 0 refills | Status: DC
Start: 2019-06-16 — End: 2019-07-21

## 2019-06-16 MED ORDER — IMS MIXTURE TEMPLATE
10 mg/m2 | Freq: Two times a day (BID) | ORAL | 0 refills | Status: CN
Start: 2019-06-16 — End: ?

## 2019-06-16 NOTE — Oral Chemotherapy Note
Name:Lundynn JOYCEE HECKSTALL           MRN: [id number]                 DOB:09/26/1971          Age: 48 y.o.  Date of Service: 06/16/2019        Oral Chemotherapy Refill Review     Patient Information:     Correct patient/DOB/Diagnosis: Yes    Reviewed most recent clinic notes for changes in plan: Yes    Most recent labs reviewed: Yes    Appointment Information:   Date of last appointment: 05/26/2019    Follow up appointment scheduled : Yes Sent to schedulers                Medication Information:     Medication name: Temozolomide    Medication in [place]: Yes      Date of last refill released from [facility]: 05/26/2019    Weight change >10%: No    Has a dose adjustment been made since last refill?No    Is Pharmacy listed in [place] treatment plan? Yes    Pharmacy: [facility]    Consent Date 12-15-2018    Patient Contact  Method of communication: phone  Confirmed need for refill: Yes  Patient taking medication as directed Yes  Confirmation of receiving pharmacy: Yes  Patient is anticipating a start date of: Ongoing    Other Information:     Plan was routed to Dr Coralyn Pear

## 2019-06-30 NOTE — Progress Notes
Patient has been Approved to receive scan <RI Head [id number] at [place] Number:   [id number]     Auth Effective Date:   06/30/2019   Auth End Date:   12/27/2019

## 2019-07-01 LAB — CBC AND DIFF

## 2019-07-07 ENCOUNTER — Encounter: Admit: 2019-07-07 | Discharge: 2019-07-07

## 2019-07-07 DIAGNOSIS — C719 Malignant neoplasm of brain, unspecified: Secondary | ICD-10-CM

## 2019-07-07 NOTE — Telephone Encounter
Contacted patient regarding status on recent lab results. Left VM to contact clinic with information.

## 2019-07-12 ENCOUNTER — Encounter: Admit: 2019-07-12 | Discharge: 2019-07-13 | Payer: Commercial Managed Care - PPO

## 2019-07-14 ENCOUNTER — Encounter: Admit: 2019-07-14 | Discharge: 2019-07-14 | Payer: Commercial Managed Care - PPO

## 2019-07-14 LAB — CBC AND DIFF

## 2019-07-15 ENCOUNTER — Encounter: Admit: 2019-07-15 | Discharge: 2019-07-15 | Payer: Commercial Managed Care - PPO

## 2019-07-18 ENCOUNTER — Encounter: Admit: 2019-07-18 | Discharge: 2019-07-18 | Payer: Commercial Managed Care - PPO

## 2019-07-21 ENCOUNTER — Encounter: Admit: 2019-07-21 | Discharge: 2019-07-21 | Payer: Commercial Managed Care - PPO

## 2019-07-21 MED ORDER — TEMOZOLOMIDE 5 MG PO CAP
5 mg | ORAL_CAPSULE | Freq: Two times a day (BID) | ORAL | 0 refills | Status: DC
Start: 2019-07-21 — End: 2019-08-15

## 2019-07-21 MED ORDER — TEMOZOLOMIDE 20 MG PO CAP
20 mg | ORAL_CAPSULE | Freq: Two times a day (BID) | ORAL | 0 refills | Status: DC
Start: 2019-07-21 — End: 2019-08-15

## 2019-07-21 MED ORDER — IMS MIXTURE TEMPLATE
10 mg/m2 | Freq: Two times a day (BID) | ORAL | 0 refills | Status: CN
Start: 2019-07-21 — End: ?

## 2019-07-21 NOTE — Progress Notes
RTC with MRI in at [facility] [place] in 8 weeks.   Precert started- will fax MRI order to OSH.   Chemo refill sent   Patient going to [place] okay to get labs done with get back from out of town.  Patient had complained about new back pain - will continue to monitor.

## 2019-07-21 NOTE — Oral Chemotherapy Note
Name:Zula ONG BAES           MRN: [id number]                 DOB:August 18, 1971          Age: 48 y.o.  Date of Service: 07/21/2019      Oral Chemotherapy Refill Review     Patient Information:     Correct patient/DOB/Diagnosis: Yes    Reviewed most recent clinic notes for changes in plan: Yes    Most recent labs reviewed: Yes    Appointment Information:   Date of last appointment:  07/21/2019    Follow up appointment scheduled : No Sent to schedulers       Medication Information:     Medication name: Temozolomide    Medication in [place]: Yes     Date of last refill released from [facility]: 06/16/2019    Weight change >10%: No    Has a dose adjustment been made since last refill?No    Is Pharmacy listed in [place] treatment plan? Yes    Pharmacy: [facility]    Consent Date 12/15/2018    Patient Contact  Method of communication: in-office  Confirmed need for refill: Yes  Patient taking medication as directed Yes  Confirmation of receiving pharmacy: Yes  Patient is anticipating a start date of: Ongoing    Other Information:     Plan was routed to Dr Coralyn Pear

## 2019-07-21 NOTE — Progress Notes
[facility]  Brain and Spine Tumor Program        Neuro-oncology Clinic Progress Note    Patient Name: Andrea [name]  DOB: 1971/05/14  MRN: [id number]  DOS: 07/21/2019      Subjective:   Andrea [name] is a 48 y.o. 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    Reason For Visit:  Neuro-Oncology Care    Treatment History:  08/26/18: Presented to the ED after becoming lost while driving to her daughter's house which is 3 minutes from her home. She reports a headache for about 2 weeks which became gradually worse and she attributed to sinus problems. She states she has felt foggy lately, fatigued and her memory has been poor. Her family reports she has been running into things around the house, she drove on the wrong side of the road, forgot to go to work, and has been jumbling her words and is confused.  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 A CT head showed a large right posterior cerebrum lesion and she was transferred to [facility] for a higher level of care.  ?  08/26/18: CT Head - Large masslike process throughout the right posterior cerebrum with probably associated hemorrhage. Vasogenic edema with diffuse sulcal effacement and subfalcine herniation. ?Suggestive diffuse thickening and increased attenuation throughout the right cerebral cortex/inter up. ?  ?  08/26/18: MRI Head - ?Large cerebral necrotic enhancing mass centered within the right posterior cerebral hemisphere, favored for high-grade glioma. Cystic metastasis is considered less likely. Localized mass effect results in right hemispheric sulcal effacement, 7 mm leftward shift, and early right uncal herniation. Moderate FLAIR hyperintensity surrounding the mass and involving the bilateral rostral callosum, which may represent a combination of vasogenic edema and/or tumor elements.  ? 08/26/18: CT CAP - CHEST: No discrete primary malignancy or thoracic metastatic disease. At least mild coronary artery calcification. ABDOMEN AND PELVIS: No discrete primary malignancy or abdominopelvic metastatic disease. Probable left ovarian cyst, incompletely characterized. Small fat containing supraumbilical hernia.  ?  08/27/18: Right craniotomy -  Path: High grade glioma with necrosis, mitoses and mvp.IDH1 negative by ihc, MGMT pending  ?  08/28/18: MRI Head - Interval right parietal craniotomy and mass resection, with expected hemorrhage within the operative cavity. There is mild postoperative pneumocephalus and a relatively small amount of postoperative gas, fluid, and blood products deep to the craniotomy. Residual peripheral enhancement along the anterior inferior origin of the resection bed, most compatible with residual tumor   Improvement in right cerebral mass effect with improving leftward shift and sulcal effacement, with resolved or nearly uncal herniation. No significant change in FLAIR signal abnormality within the right posterior cerebral hemisphere and along the bilateral rostral callosum  ?  08/31/18: Admitted to inpatient rehab at [facility] with tentative DC date 09/15/18    Interval History:  Andrea [name] is seen today for telehealth visit. Her fatigue continues although improved. 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         Review of Systems    ROS is conducted in all 12 systems and, except as noted above in HPI, is summarized below:    No headaches, no vision changes.  No difficulty with speech or swallowing.  No chest pain, back pain,  no breathing difficulties.  No abdominal pain.  No extremity edema  No neurologic changes, specifically weakness or sensory changes  No recent seizures, no recent falls  No fevers, chills or sweats. No cough No nausea, vomiting, diarrhea or constipation  No unintended weight loss.  No excessive fatigue.  No skin changes or rash  No depression complaints      Past Medical History:     Medical History:   Diagnosis Date   ? ADHD    ? Depression    ? Disorder of thyroid gland    ? Hypertension      Surgical History:   Procedure Laterality Date   ? Right craniotomy for resection of tumor Right 08/27/2018    Performed by Theotis Barrio, MD at [facility]   ? MICROSURGICAL TECHNIQUES - REQUIRING OPERATING MICROSCOPE USE N/A 08/27/2018    Performed by Theotis Barrio, MD at [facility]   ? STEREOTACTIC COMPUTER-ASSISTED CRANIAL PROCEDURE - INTRADURAL N/A 08/27/2018    Performed by Theotis Barrio, MD at [facility]   ? ULTRASOUND GUIDANCE - INTRAOPERATIVE  08/27/2018    Performed by Theotis Barrio, MD at [facility]   ? CRANIECTOMY/ BONE FLAP CRANIOTOMY EXCISION SUPRATENTORIAL BRAIN TUMOR Right 08/28/2018    Performed by Theotis Barrio, MD at [facility]   ? CHOLECYSTECTOMY     ? HX APPENDECTOMY     ? HX HYSTERECTOMY     ? HX JOINT REPLACEMENT     ? HYSTERECTOMY       Social History     Socioeconomic History   ? Marital status: Single     Spouse name: Not on file   ? Number of children: 1   ? Years of education: Not on file   ? Highest education level: Not on file   Occupational History   ? Not on file   Tobacco Use   ? Smoking status: Current Some Day Smoker     Types: Cigarettes   ? Smokeless tobacco: Never Used   Substance and Sexual Activity   ? Alcohol use: Never     Frequency: Never   ? Drug use: Never   ? Sexual activity: Not on file   Other Topics Concern   ? Not on file   Social History Narrative   ? Not on file       Current Medications:  ? acetaminophen (TYLENOL) 325 mg tablet Take two tablets by mouth every 6 hours as needed for Pain.   ? ALPRAZolam (XANAX) 0.5 mg tablet Take 1 mg by mouth three times daily as needed for Anxiety PO.   ? apixaban (ELIQUIS) 5 mg tablet Take one tablet by mouth twice daily. Indications: a clot in the lung ? buPROPion XL (WELLBUTRIN XL) 150 mg tablet Take one tablet by mouth daily. Do not crush or chew.   ? buspirone HCl (BUSPAR PO) Take 7.5 mg by mouth twice daily.   ? calcium carbonate (TUMS) 500 mg (200 mg elemental calcium) chewable tablet Chew 500 mg by mouth as Needed.   ? celecoxib (CELEBREX) 200 mg capsule Take 1 capsule by mouth twice daily   ? cyclosporine (RESTASIS) 0.05 % ophthalmic emulsion Apply one drop to both eyes twice daily. Fill largest quantity covered  Indications: keratoconjunctivitis sicca, or dry and inflamed cornea and conjunctiva of the eye   ? dexAMETHasone (DECADRON) 4 mg tablet Take 4 mg by mouth twice daily. Take with food.   ? docusate (COLACE) 100 mg capsule Take 100-200  mg by mouth twice daily as needed for Constipation.   ? duloxetine DR (CYMBALTA) 60 mg capsule Take one capsule by mouth daily.   ? furosemide (LASIX) 40 mg tablet Take 40 mg by mouth daily as needed (leg swelling).   ? granisetron (KYTRIL) 1 mg tablet Take one tablet by mouth every 12 hours as needed for Nausea.   ? HYDROcodone/acetaminophen (NORCO) 5/325 mg tablet Take 1 tablet by mouth every 6 hours as needed for Pain   ? levothyroxine (SYNTHROID) 200 mcg tablet Take 200 mcg by mouth daily 30 minutes before breakfast.   ? losartan-hydrochlorothiazide (HYZAAR) 100-12.5 mg tablet Take 1 tablet by mouth every morning. Patient take 1/2 tablet daily.   ? polyethylene glycol 3350 (MIRALAX) 17 g packet Take one packet by mouth daily. (Patient taking differently: Take 17 g by mouth as Needed.)   ? temozolomide (TEMODAR) 20 mg capsule Take one capsule (20 mg) by mouth twice daily with 1 other temozolomide prescription for 25 mg per dose.   ? temozolomide (TEMODAR) 5 mg capsule Take one capsule (5 mg) by mouth twice daily with 1 other temozolomide prescription for 25 mg per dose.   ? vitamins, multiple cap Take 1 capsule by mouth daily.       Allergies:  Allergies   Allergen Reactions   ? Morphine ANAPHYLAXIS ? Codeine RASH   ? Erythromycin SHORTNESS OF BREATH   ? Latex RASH   ? Nsaids (Non-Steroidal Anti-Inflammatory Drug) SEE COMMENTS     History of GI bleed - has been instructed to avoid NSAID's       Vitals:  Vitals:    07/21/19 0922   PainSc: Zero     There is no height or weight on file to calculate BSA.    Exam (telehealth visit)    ECOG PS = 2-3 (based on video observations/reports)    Pain = 0/10    Well nourished, well developed, in no acute distress, sequelae of steroid use noted  Mood and affect appear appropriate for the situation  Cognition appears intact, language is intact  Ambulatory at home    CN grossly intact 2-12 with the exception of dense hemianopsia  Strength appears intact in all 4 extremities  Balance (ambulation) is intact    Laboratory Results:        Assessment and Plan:  Patient Active Problem List    Diagnosis Date Noted   ? Glioblastoma (HCC) 09/13/2018     Priority: High   ? 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 Impaired mobility and ADLs 08/31/2018   ? Impaired cognition 08/31/2018   ? HTN (hypertension) 08/31/2018   ? Hypothyroid 08/31/2018   ? 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  Coronavirus is limiting activity.  Dense left hemianopsia persists.  Her brief treatment history includes presentation with a few weeks of progressive confusion, bumping into things.  Imaging demonstrates a right inferior parietal enhancing mass with surrounding edema.  She underwent radiation with concurrent temozolomide (no depakote) ending in early  January 2020.  Began adjuvant temolomide, adjusted to BID dosed due to her comorbidities.    We reviewed her MRI scans from 07-12-2019 vs comparisons from 05-23-2019 and 03-17-2019, overall the right parietal/occipital area surgical cavity is unchanged as is the surrounding FLAIR.  Although I do not have post-contrast sequences in our PACS system, the outside radiology read indicates stable minimial linear enhancement at the margin of the resection cavity.  She is tolerating reduced dose (BID dosed) temozolomide much better than 5-day, for now will continue with plan, RTC in about 8 weeks with repeat MRI scan, sooner if problems. Encouraged increasing activity level, discussed PT referral and will continue steroid taper, hopefully to off.    Back pain - on and off, stretching/tylenol help, character is not consistent with drop metastases, more consistent with musculo-skeletal pain.  As it is overall getting better, will continue conservative management, she will let us know.  Discuss with PT at next visit.      I spent a total of 30 minutes today in direct patient evaluation, from 0930 to 1000, with more than 20 minutes spent in treatment plan formulation, symptom management discussion and consultation as outlined above.         Bartholome Bill, MD  Director, Brain and Spine Tumor Program   [facility]  p. [phone]    f. [phone]

## 2019-07-26 MED ORDER — CELECOXIB 200 MG PO CAP
ORAL_CAPSULE | Freq: Two times a day (BID) | 0 refills | Status: DC
Start: 2019-07-26 — End: 2019-11-10

## 2019-07-28 LAB — COMPREHENSIVE METABOLIC PANEL

## 2019-07-28 LAB — CBC AND DIFF

## 2019-08-11 ENCOUNTER — Encounter: Admit: 2019-08-11 | Discharge: 2019-08-11 | Payer: Commercial Managed Care - PPO

## 2019-08-12 ENCOUNTER — Encounter: Admit: 2019-08-12 | Discharge: 2019-08-12 | Payer: Commercial Managed Care - PPO

## 2019-08-12 LAB — COMPREHENSIVE METABOLIC PANEL

## 2019-08-12 LAB — CBC AND DIFF

## 2019-08-12 NOTE — Telephone Encounter
I have overseen the pharmacy visit and participated in all pharmacy decision making regarding this patient's care.    I have personally reviewed their laboratory reports, medication list, and pertinent progress notes as detailed in the resident/intern's note.    Ainsley Spinner, [facility]   Oncology Clinical Pharmacist   08/12/2019

## 2019-08-12 NOTE — Telephone Encounter
Oral Chemotherapy Reassessment Note    Appropriateness of Therapy    Andrea [name] continues on Temozolomide for the treatment of glioblastoma. The regimen of Temozolomide 25 mg po BID is appropriate to continue at this time.    No renal or hepatic dose adjustment is required at this time.     Treatment will continue until progression or unacceptable toxicity.        CBC w diff    Lab Results   Component Value Date/Time    WBC 4.2 02/23/2019    RBC 3.28 (L) 12/23/2018 10:03 AM    HGB 11.3 (L) 12/23/2018 10:03 AM    HCT 33.5 (L) 12/23/2018 10:03 AM    MCV 102.3 (H) 12/23/2018 10:03 AM    MCH 34.4 (H) 12/23/2018 10:03 AM    MCHC 33.6 12/23/2018 10:03 AM    RDW 20.4 (H) 12/23/2018 10:03 AM    PLTCT 202 02/23/2019    MPV 6.9 (L) 12/23/2018 10:03 AM    Lab Results   Component Value Date/Time    NEUT 73 12/23/2018 10:03 AM    ANC 2.7 02/23/2019    LYMA 17 (L) 12/23/2018 10:03 AM    ALC 1.00 12/23/2018 10:03 AM    MONA 8 12/23/2018 10:03 AM    AMC 0.50 12/23/2018 10:03 AM    EOSA 1 12/23/2018 10:03 AM    AEC 0.10 12/23/2018 10:03 AM    BASA 1 12/23/2018 10:03 AM    ABC 0.00 12/23/2018 10:03 AM        Comprehensive Metabolic Profile    Lab Results   Component Value Date/Time    NA 140 12/23/2018 10:03 AM    K 3.3 (L) 12/23/2018 10:03 AM    CL 103 12/23/2018 10:03 AM    CO2 26 12/23/2018 10:03 AM    GAP 11 12/23/2018 10:03 AM    BUN 5 (L) 12/23/2018 10:03 AM    CR 0.74 12/23/2018 10:03 AM    GLU 99 12/23/2018 10:03 AM    Lab Results   Component Value Date/Time    CA 9.5 12/23/2018 10:03 AM    PO4 3.0 08/26/2018 02:44 PM    ALBUMIN 3.7 12/23/2018 10:03 AM    TOTPROT 7.2 12/23/2018 10:03 AM    ALKPHOS 34 12/23/2018 10:03 AM    AST 34 12/23/2018 10:03 AM    ALT 39 12/23/2018 10:03 AM    TOTBILI 0.4 12/23/2018 10:03 AM    GFR >60 12/23/2018 10:03 AM    GFRAA >60 12/23/2018 10:03 AM              Creatinine clearance cannot be calculated (Patient's most recent lab result is older than the maximum 30 days allowed.) Response to Therapy    The electronic medical record for Andrea [name] has been reviewed. No evidence of progression that would necessitate a change of therapy has been identified. Patient will continue therapy as she is achieving therapeutic benefit.     Adverse Effects Assessment    Andrea [name] is having the following adverse effects: fatigue, joint pain, tingling in toes and feet.   None of the adverse effects were severe enough to interfere with adherence.     Adherence Assessment    The patient's ability to self-administer medication was assessed.     Andrea [name] reports missing 0 doses over the past 3 month(s) not related to toxicity. Andrea [name] was re-educated on importance of adherence.      Medication Reconciliation    A  medication history and reconciliation was performed (including prescription medications, supplements, over the counter medications, and herbal products). The medication list was updated and the patient?s current medication list is included below.     Prior to Admission medications    Medication Sig Start Date End Date Taking? Authorizing Provider   acetaminophen (TYLENOL) 325 mg tablet Take two tablets by mouth every 6 hours as needed for Pain. 09/09/18   Arickx, Kandace Parkins, MD   ALPRAZolam Prudy Feeler) 0.5 mg tablet Take 1 mg by mouth three times daily as needed for Anxiety PO. 09/20/18   HISTORICAL PROVIDER   apixaban (ELIQUIS) 5 mg tablet Take one tablet by mouth twice daily. Indications: a clot in the lung 12/23/18   Omer Jack, MD   buPROPion XL (WELLBUTRIN XL) 150 mg tablet Take one tablet by mouth daily. Do not crush or chew. 09/10/18   Arickx, Kandace Parkins, MD   buspirone HCl (BUSPAR PO) Take 7.5 mg by mouth twice daily.    HISTORICAL PROVIDER   calcium carbonate (TUMS) 500 mg (200 mg elemental calcium) chewable tablet Chew 500 mg by mouth as Needed.    HISTORICAL PROVIDER   celecoxib (CELEBREX) 200 mg capsule Take 1 capsule by mouth twice daily 07/29/19   Bartholome Bill, MD   cyclosporine (RESTASIS) 0.05 % ophthalmic emulsion Apply one drop to both eyes twice daily. Fill largest quantity covered  Indications: keratoconjunctivitis sicca, or dry and inflamed cornea and conjunctiva of the eye 11/05/18   Corky Sox, MD   dexAMETHasone (DECADRON) 4 mg tablet Take 4 mg by mouth twice daily. Take with food.    HISTORICAL PROVIDER   docusate (COLACE) 100 mg capsule Take 100-200 mg by mouth twice daily as needed for Constipation.    Provider, Historical   duloxetine DR (CYMBALTA) 60 mg capsule Take one capsule by mouth daily. 09/09/18   Arickx, Kandace Parkins, MD   furosemide (LASIX) 40 mg tablet Take 40 mg by mouth daily as needed (leg swelling).    Provider, Historical   granisetron (KYTRIL) 1 mg tablet Take one tablet by mouth every 12 hours as needed for Nausea. 12/29/18   Bartholome Bill, MD   HYDROcodone/acetaminophen (NORCO) 5/325 mg tablet Take 1 tablet by mouth every 6 hours as needed for Pain    HISTORICAL PROVIDER   levothyroxine (SYNTHROID) 200 mcg tablet Take 200 mcg by mouth daily 30 minutes before breakfast.    Provider, Historical   losartan-hydrochlorothiazide (HYZAAR) 100-12.5 mg tablet Take 1 tablet by mouth every morning. Patient take 1/2 tablet daily.    Provider, Historical   polyethylene glycol 3350 (MIRALAX) 17 g packet Take one packet by mouth daily.  Patient taking differently: Take 17 g by mouth as Needed. 09/10/18   Arickx, Kandace Parkins, MD   temozolomide (TEMODAR) 20 mg capsule Take one capsule (20 mg) by mouth twice daily with 1 other temozolomide prescription for 25 mg per dose. 07/21/19   Bartholome Bill, MD   temozolomide [facility]) 5 mg capsule Take one capsule (5 mg) by mouth twice daily with 1 other temozolomide prescription for 25 mg per dose. 07/21/19   Bartholome Bill, MD   vitamins, multiple cap Take 1 capsule by mouth daily.    Provider, Historical       Drug-drug and drug-food interactions between the patients? specialty medication and their medication list were assessed and reviewed with the patient.     No significant drug-drug interactions were identified.  Andrea [name] was instructed to speak with her health care provider and/or the oral chemotherapy pharmacist before starting any new drug, including prescription or over the counter, natural / herbal products, or vitamins.      Allergies   Allergen Reactions   ? Morphine ANAPHYLAXIS   ? Codeine RASH   ? Erythromycin SHORTNESS OF BREATH   ? Latex RASH   ? Nsaids (Non-Steroidal Anti-Inflammatory Drug) SEE COMMENTS     History of GI bleed - has been instructed to avoid NSAID's       Vaccination Status Assessment   Immunization History   Administered Date(s) Administered   ? DTaP vaccine IM (Infanrix) 07/31/1970, 02/21/1971, 03/18/1971, 03/16/1972   ? Flu Vaccine =>6 Months Quadrivalent PF 09/04/2018   ? Flu vaccine, inj unspecified (Historical) 09/04/2018   ? HEPATITIS B vaccine, unspecified (Historical) 09/15/1989, 10/29/1989, 11/26/1990, 06/11/2004   ? MMR Vaccine 05/01/1971, 05/13/1975, 12/25/2004   ? OPV 07/31/1970, 02/21/1971, 02/22/1972, 01/01/1983   ? Td vaccine, unspecified (Historical) 02/05/1992, 06/05/2003   ? Tdap Vaccine 04/23/2017       Appropriate recommended vaccinations were reviewed and discussed with the patient. The patient will be reminded about the importance of receiving an annual influenza vaccine as indicated.      Reproductive Risk Assessment    Andrea [name] is a 48 y.o. female    As patient is a female not of child-bearing potential, education was not applicable.     Risk Evaluation and Mitigation Strategy (REMS) Assessment    No REMS is required for this medication.    What to do with any unused or expired medications    Appropriate safe handling and disposal procedures were reviewed with the patient. Andrea [name] was instructed to return any unused or expired oral chemotherapy medication to a designated disposal bin at one of the [facility] locations or to utilize a community drug take back program.  Instructed not to flush down the toilet or to crush the medication.      Follow-up Plan     Andrea [name] was encouraged to call the oral chemotherapy pharmacist at [phone] with questions. This medication is considered high risk per our internal oral chemotherapy risk categorization and the patient will be contacted for education, toxicity check at 2 weeks, and reassessment every 3 months, if applicable (high risk monitoring).      Re-assessment has been completed. Next reassessment planned for 3 month(s).     [employer]  Pharmacy Student  08/12/2019

## 2019-08-15 ENCOUNTER — Encounter: Admit: 2019-08-15 | Discharge: 2019-08-15 | Payer: Commercial Managed Care - PPO

## 2019-08-15 DIAGNOSIS — C719 Malignant neoplasm of brain, unspecified: Secondary | ICD-10-CM

## 2019-08-15 MED ORDER — TEMOZOLOMIDE 20 MG PO CAP
20 mg | ORAL_CAPSULE | Freq: Two times a day (BID) | ORAL | 0 refills | Status: DC
Start: 2019-08-15 — End: 2019-11-10

## 2019-08-15 MED ORDER — IMS MIXTURE TEMPLATE
10 mg/m2 | Freq: Two times a day (BID) | ORAL | 0 refills | Status: CN
Start: 2019-08-15 — End: ?

## 2019-08-15 MED ORDER — TEMOZOLOMIDE 5 MG PO CAP
5 mg | ORAL_CAPSULE | Freq: Two times a day (BID) | ORAL | 0 refills | Status: DC
Start: 2019-08-15 — End: 2019-11-10

## 2019-08-15 NOTE — Oral Chemotherapy Note
Name:Kitt RUNE TYSON           MRN: [id number]                 DOB:[birth year]          Age: 48 y.o.  Date of Service: 08/15/2019        Oral Chemotherapy Refill Review     Patient Information:     Correct patient/DOB/Diagnosis: Yes    Reviewed most recent clinic notes for changes in plan: Yes    Most recent labs reviewed: Yes    Appointment Information:   Date of last appointment:  07/21/2019    Follow up appointment scheduled : No Sent to schedulers                  Medication Information:     Medication name: Temozolomide    Medication in [place]: Yes      Date of last refill released from [facility]: 07/21/2019    Weight change >10%: No    Has a dose adjustment been made since last refill?No    Is Pharmacy listed in [place] treatment plan? Yes    Pharmacy: [facility]    Consent Date 12/15/2018    Patient Contact  Method of communication: in-office  Confirmed need for refill: Yes  Patient taking medication as directed Yes  Confirmation of receiving pharmacy: Yes  Patient is anticipating a start date of: Ongoing    Other Information:     Plan was routed to Dr Coralyn Pear

## 2019-08-29 ENCOUNTER — Encounter: Admit: 2019-08-29 | Discharge: 2019-08-29 | Payer: Commercial Managed Care - PPO

## 2019-08-29 DIAGNOSIS — C719 Malignant neoplasm of brain, unspecified: Secondary | ICD-10-CM

## 2019-08-30 IMAGING — CR CHEST
1 series · 1 of 1 positions shown · non-contrast
Comparison: none

[chest port x-wise]
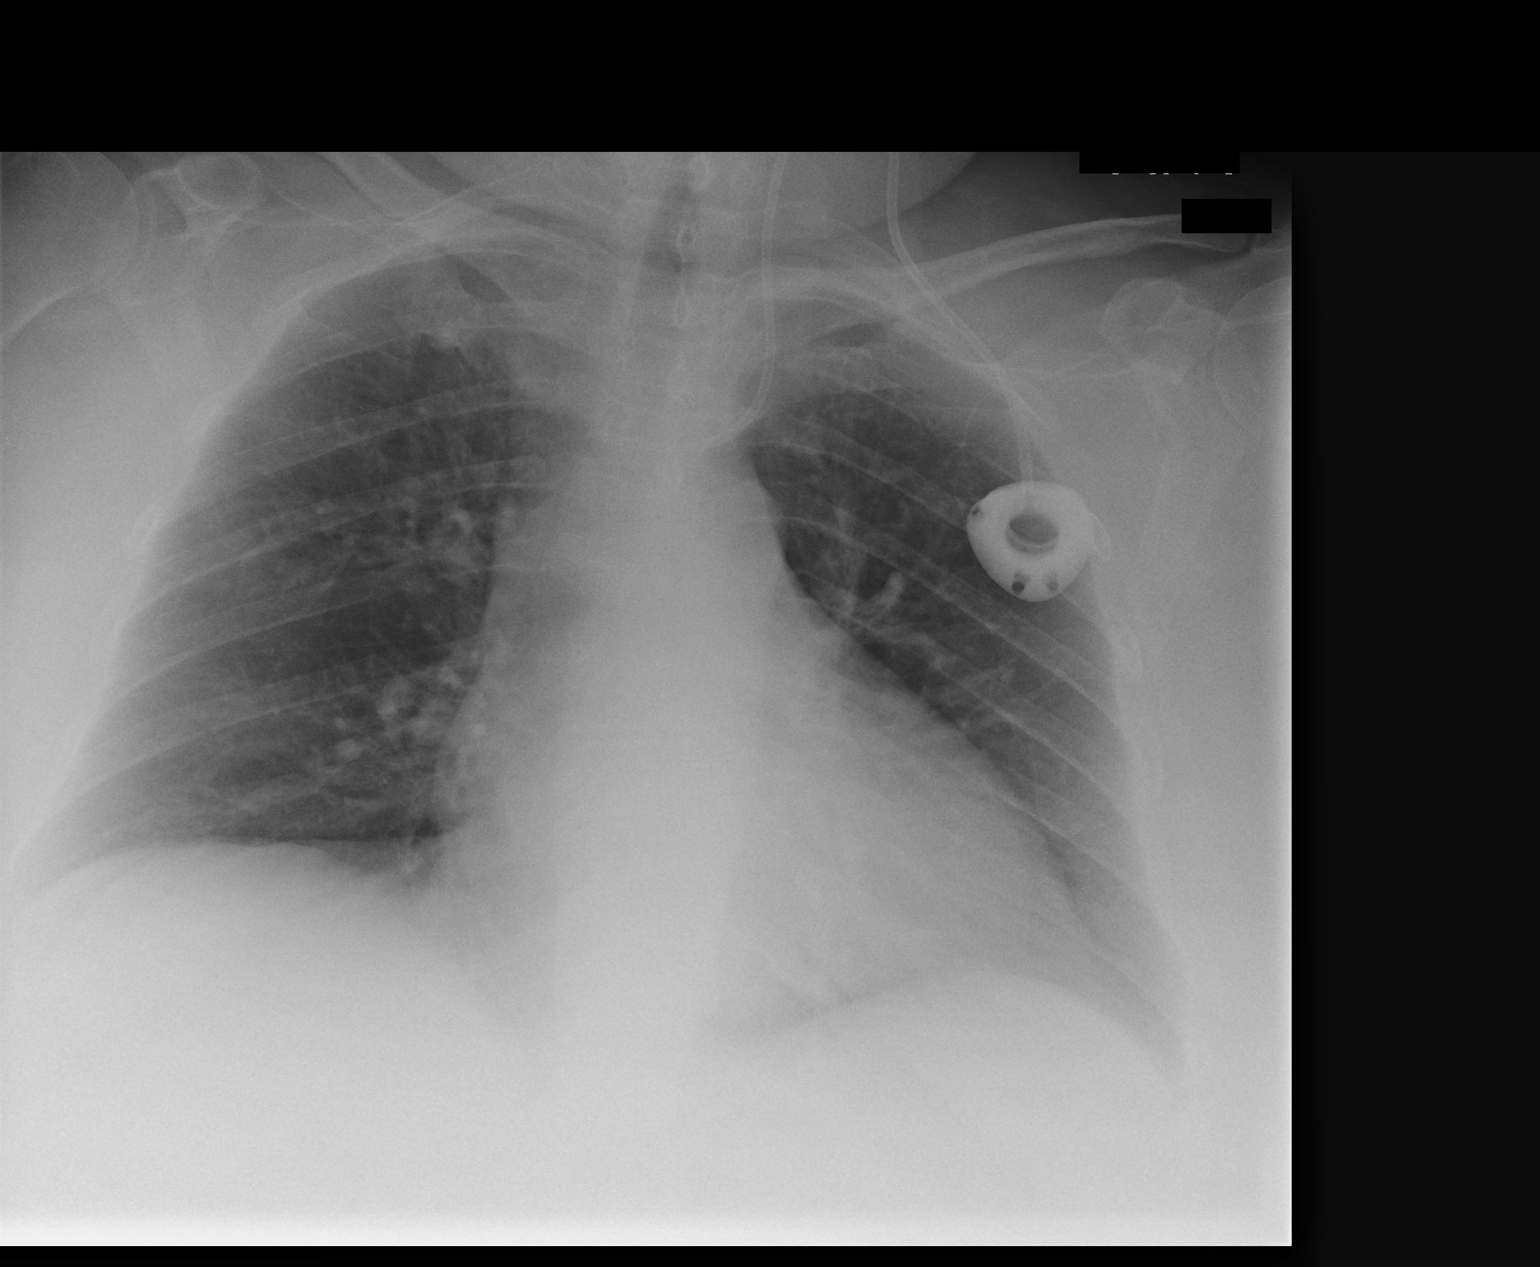

[1 of 1 positions shown; findings below may reference images not displayed]

DIAGNOSTIC STUDIES

EXAM

PORTABLE CHEST

INDICATION

weakness
PT STATES LEFT SIDE WEAKNESS. HX OF GLIOBLASTOMA. TJ/BM

TECHNIQUE

Single view of the chest

COMPARISONS

05/16/2019, 02/24/2019, and 02/09/2019

FINDINGS

Left Port-A-Cath is unchanged in position; the tip is in the brachiocephalic vein.. Cardiac
silhouette is borderline enlarged but unchanged. Pulmonary vasculature is within normal limits. No
confluent infiltrate nor pleural effusions identified.

IMPRESSION

Borderline enlargement of the cardiac silhouette. Overall no significant change comparison to
prior studies..

Tech Notes:

PT STATES LEFT SIDE WEAKNESS. HX OF GLIOBLASTOMA. TJ/BM

## 2019-08-30 IMAGING — CT BRAIN WO(Adult)
2 of 4 series · 12 of 47 positions shown, 15 images · non-contrast
Comparison: none

[Series 4: brain cor 5.00 hr40 s3 · coronal · 0.34mm/px · 3 of 45 slices shown]
[im 15/45  brain]
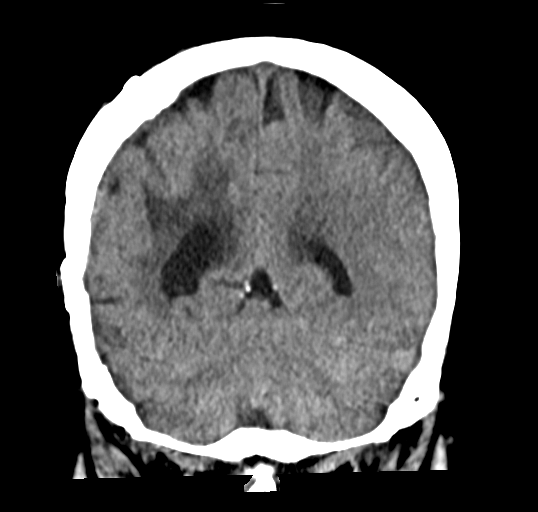
[im 20/45  brain]
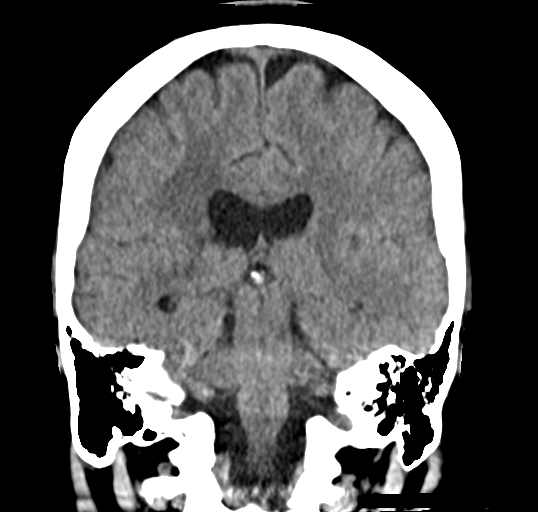
[im 25/45  brain]
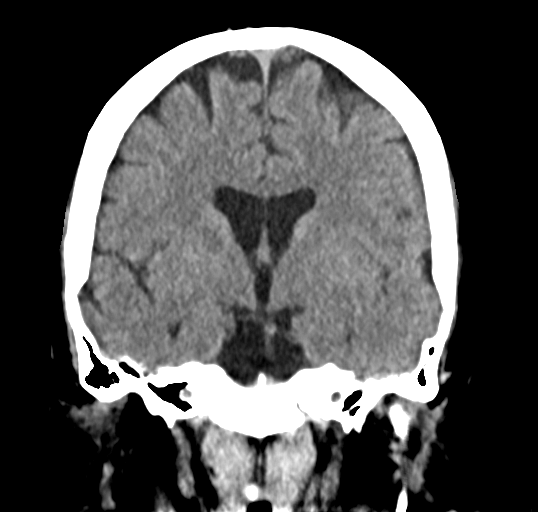

[Series 8: brain ax 5.00 hr60 s3 · axial · 0.36mm/px · z∈[-573,-430]mm · 9 of 36 slices shown, 12 images]
[im 3/36  brain]
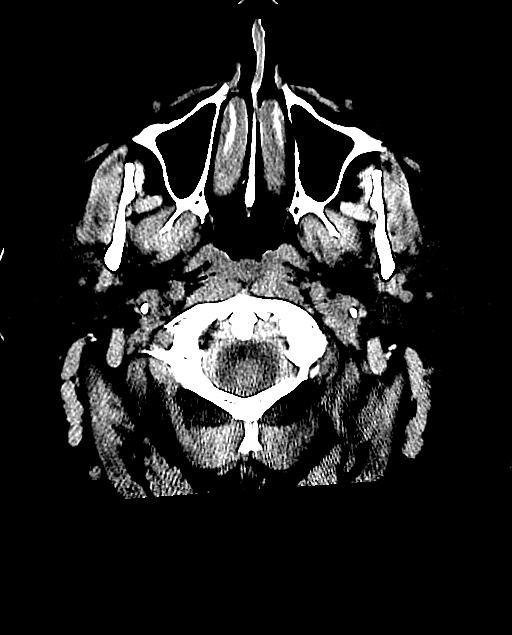
[im 3/36  bone]
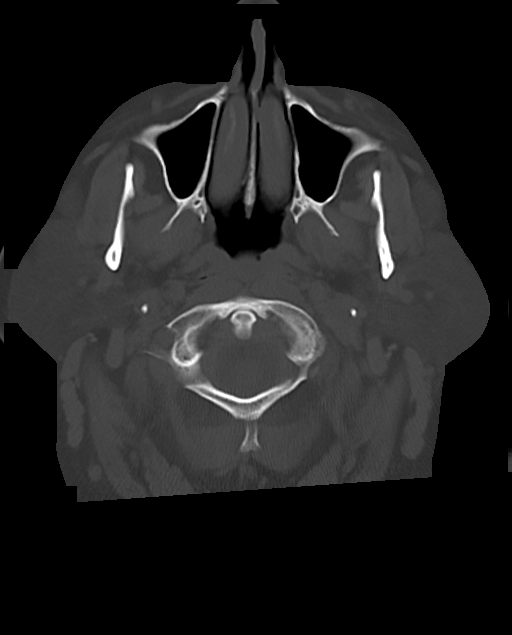
[im 8/36  brain]
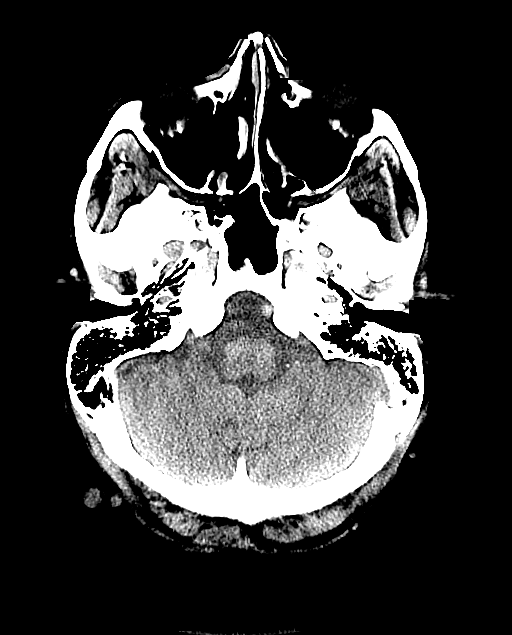
[im 10/36  brain]
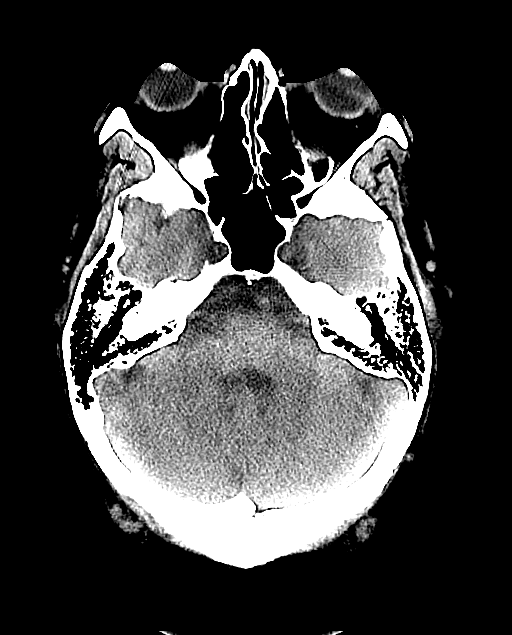
[im 15/36  brain]
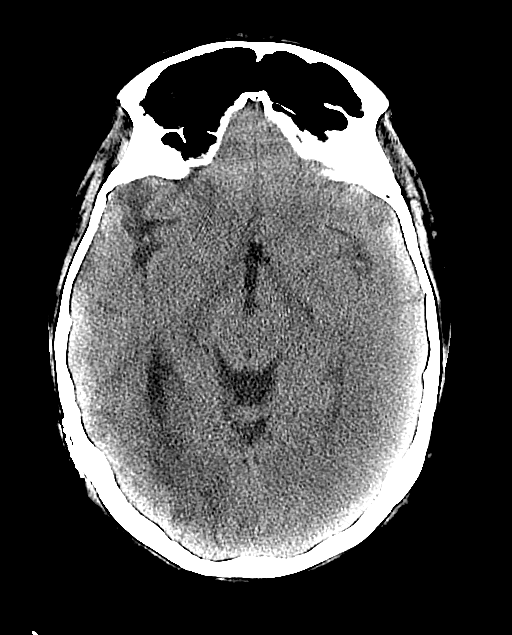
[im 19/36  brain]
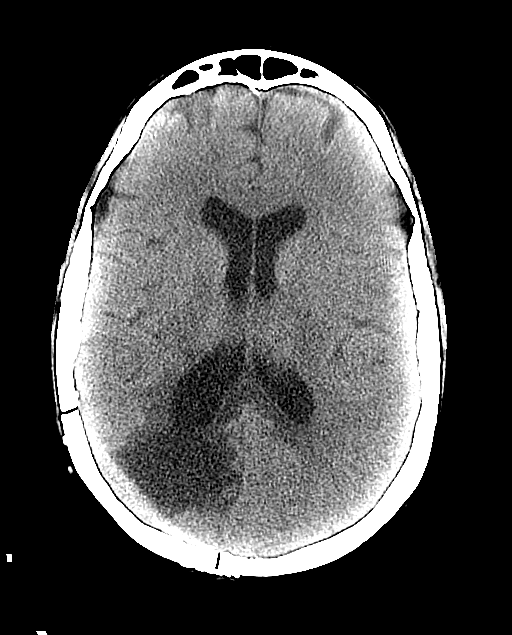
[im 19/36  bone]
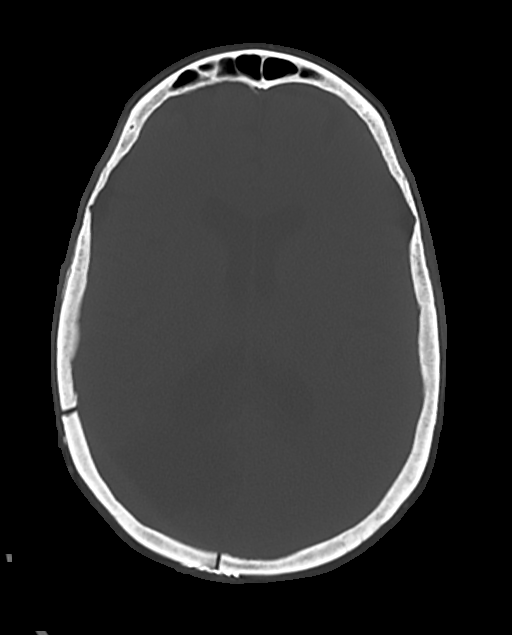
[im 22/36  brain]
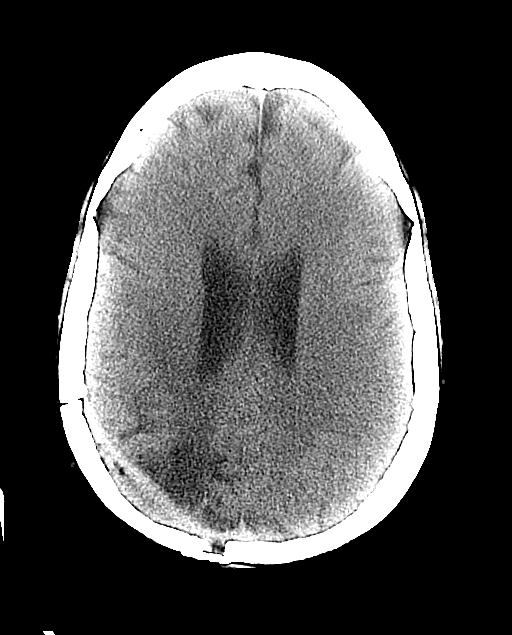
[im 26/36  brain]
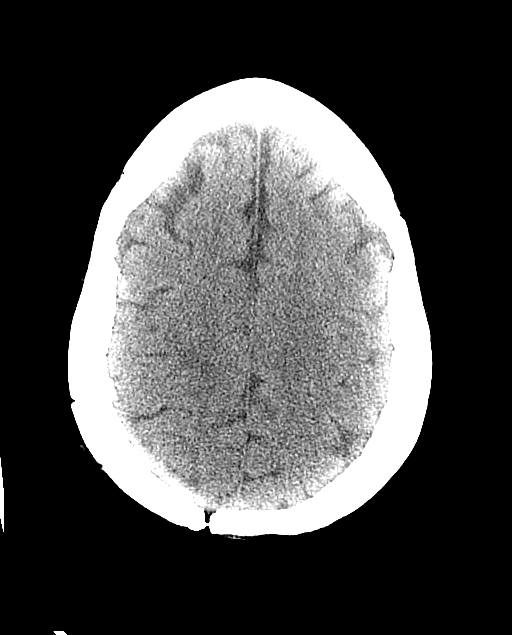
[im 29/36  brain]
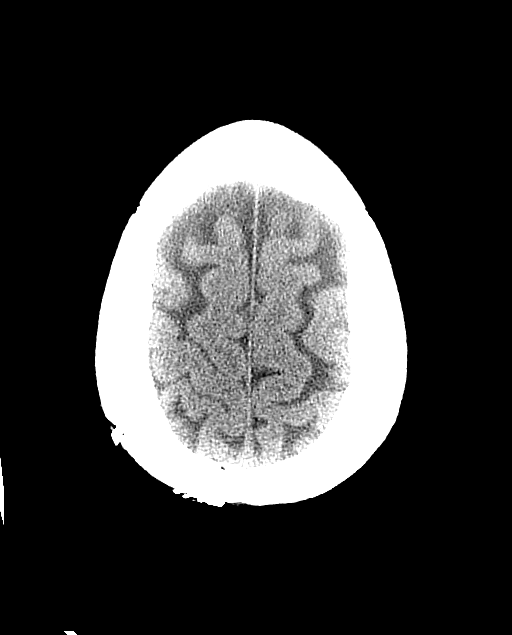
[im 33/36  brain]
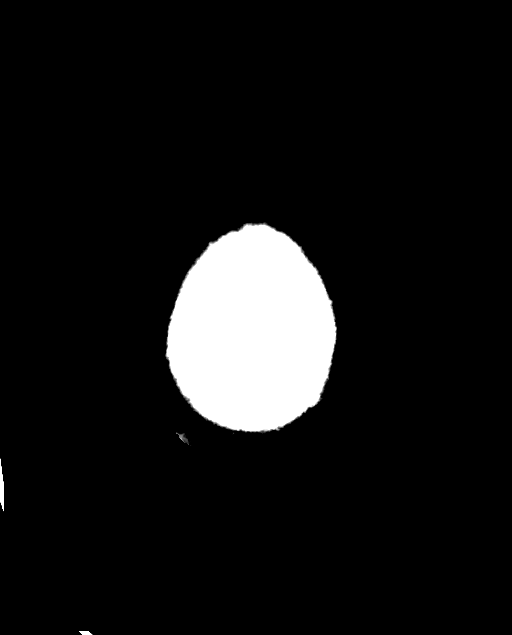
[im 33/36  bone]
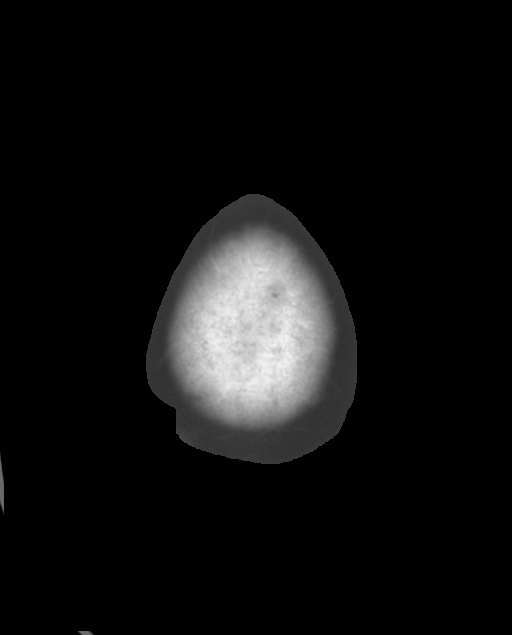

[12 of 47 positions shown; findings below may reference images not displayed]

1. DIAGNOSTIC STUDIES

DIAGNOSTIC STUDIES

EXAM

NONCONTRAST CT HEAD

INDICATION

weakness, glioblastoma
PT C/O LEFT LEG WEAKNESS. HX OF GLIOBLASTOMA. CT/NM 5/0. TJ

TECHNIQUE

Helical CT of the head is performed without contrast. Sagittal and coronal reconstruction images
are obtained.

All CT scans at this facility use dose modulation, iterative reconstruction, and/or weight based
dosing when appropriate to reduce radiation dose to as low as reasonably achievable.

Number of previous computed tomography exams in the last 12 months is 5.

Number of previous nuclear medicine myocardial perfusion studies in the last 12 months is 0  .

COMPARISONS

CT head 05/16/2019

FINDINGS

No intracranial hemorrhage is present.

Right craniotomy changes are present. A right posterior parietal hypodensity is present and
unchanged from 05/16/2019. Appearance is compatible with port cephaly from surgical resection of the
known glioblastoma. There is a small amount of surrounding hypodensity especially inferior to the
region of foreign cephaly; this is unchanged and compatible with post therapeutic changes/gliosis or
stable adjacent edema. There is an adjacent extra-axial fluid collection which is isodense with the
CSF; this is unchanged. There is minimal ex vacuo enlargement of the right lateral ventricle which
is stable.

The sulci, cisterns, and ventricles are otherwise normal in size and configuration with no midline
shift, or additional region of focal enlargement. No mass effect is identified. No findings to
suggest new focal area of abnormal parenchymal density are present.

The visualized portions of the paranasal sinuses and mastoid air cells are unremarkable. Aside from
the craniotomy, the calvarium is intact. The globes and intraconal content have a normal appearance
for a noncontrast study.

IMPRESSION

1. Right craniotomy changes with right posterior parietal porencephalic compatible with known
resection of glioblastoma. Surrounding hypodensity is specially inferiorly, is unchanged as is
overlying extra-axial fluid collection.
2. Noncontrast CT head demonstrates no findings to suggest to suggest intracranial hemorrhage or
additional interval change. If there is clinical concern for acute ischemia/infarct, correlation
with MR profusion or CT perfusion could be performed.

Tech Notes:

PT C/O LEFT LEG WEAKNESS. HX OF GLIOBLASTOMA. CT/NM 5/0. TJ

## 2019-08-31 ENCOUNTER — Encounter: Admit: 2019-08-31 | Discharge: 2019-08-31 | Payer: Commercial Managed Care - PPO

## 2019-08-31 IMAGING — MR Head^Brain
10 series · 46 of 48 positions shown · non-contrast
Comparison: none

[Series 2: T1 · sagittal · 5.0mm · 0.45mm/px · 3 of 20 slices shown]
[im 1/20]
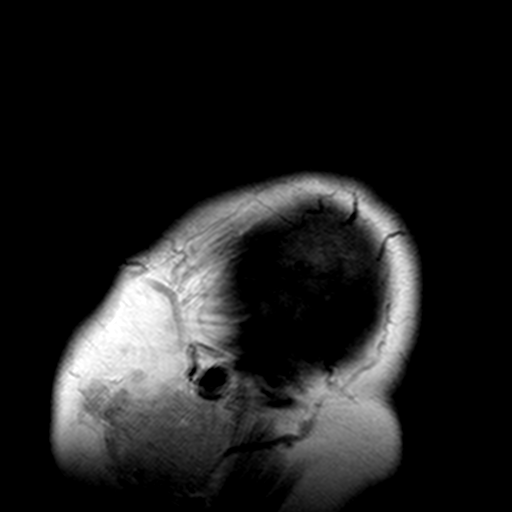
[im 10/20]
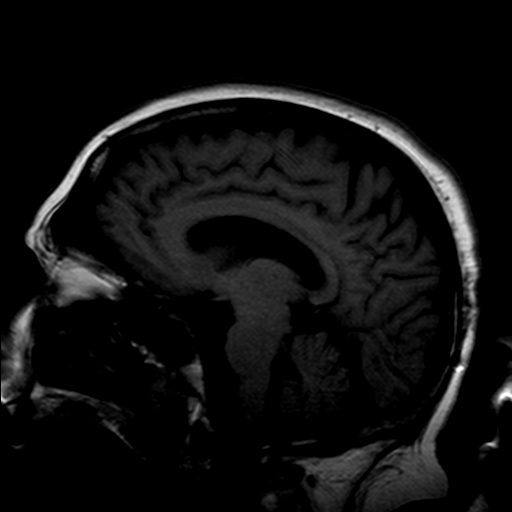
[im 20/20]
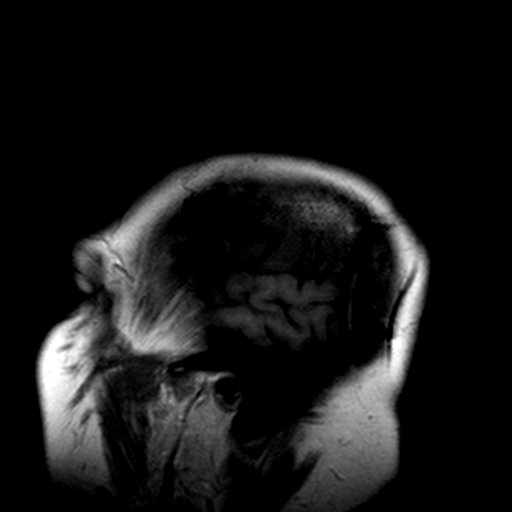

[Series 3: DWI · axial · 5.0mm · 1.80mm/px · z∈[-59,+61]mm · 11 of 63 slices shown (1 of 2)]
[im 1/63]
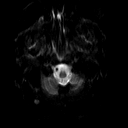
[im 7/63]
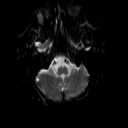
[im 13/63]
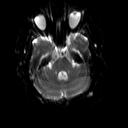
[im 19/63]
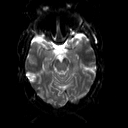
[im 25/63]
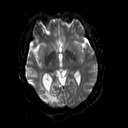
[im 32/63]
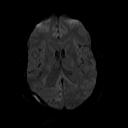
[im 38/63]
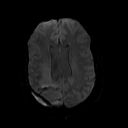
[im 44/63]
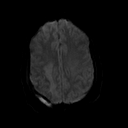
[im 50/63]
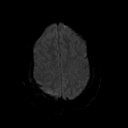
[im 56/63]
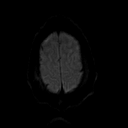
[im 63/63]
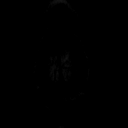

[Series 4: DWI · axial · 5.0mm · 1.80mm/px · z∈[-59,+61]mm · 4 of 21 slices shown (2 of 2)]
[im 1/21]
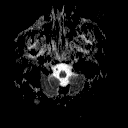
[im 7/21]
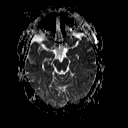
[im 14/21]
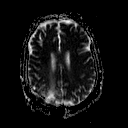
[im 21/21]
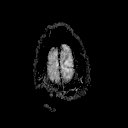

[Series 5: FLAIR · axial · 5.0mm · 0.45mm/px · z∈[-57,+79]mm · 4 of 24 slices shown (1 of 2)]
[im 1/24]
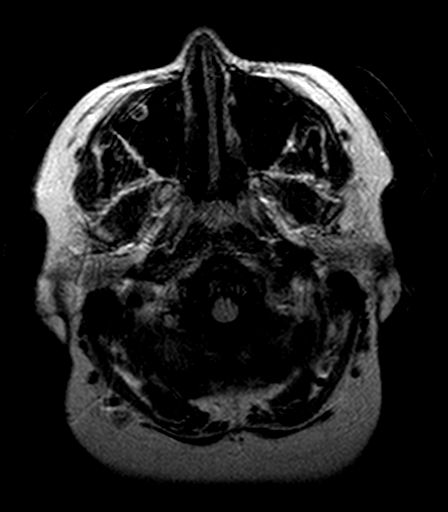
[im 8/24]
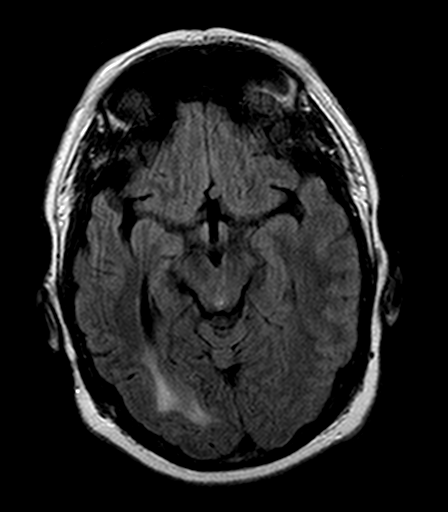
[im 16/24]
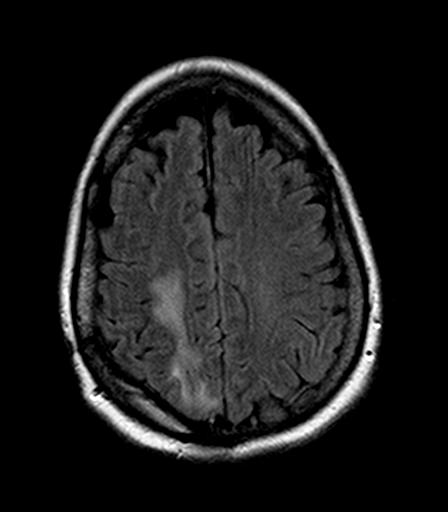
[im 24/24]
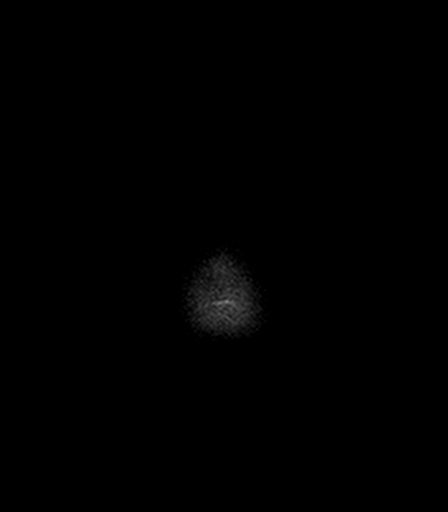

[Series 8: axial blood · axial · 5.0mm · 0.45mm/px · z∈[-48,-13]mm · 2 of 21 slices shown]
[im 1/21]
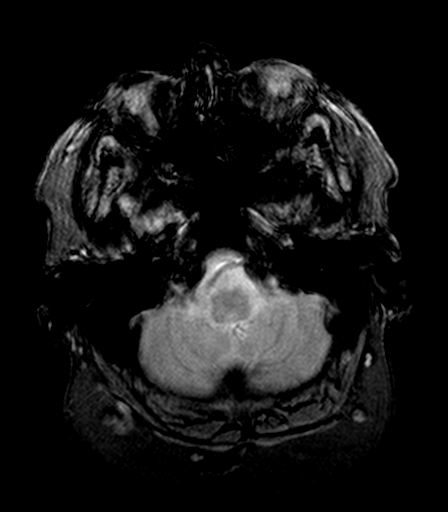
[im 7/21]
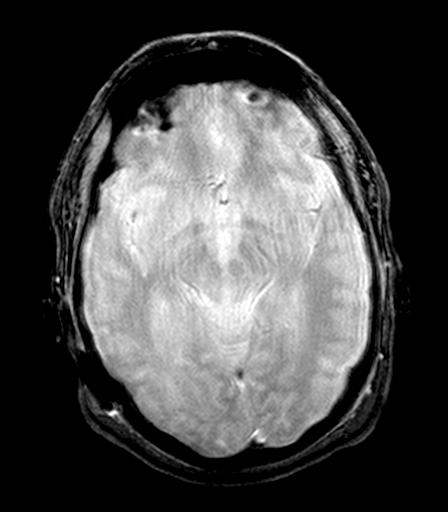

[Series 9: FLAIR · axial · 5.0mm · 0.45mm/px · z∈[-57,+79]mm · 4 of 24 slices shown (2 of 2)]
[im 1/24]
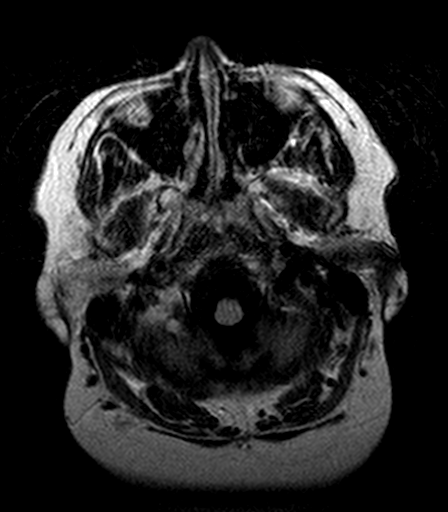
[im 8/24]
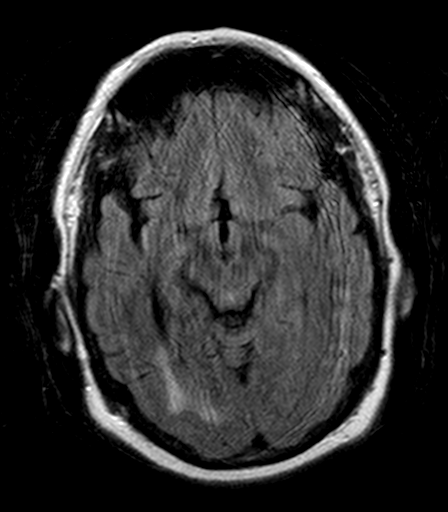
[im 16/24]
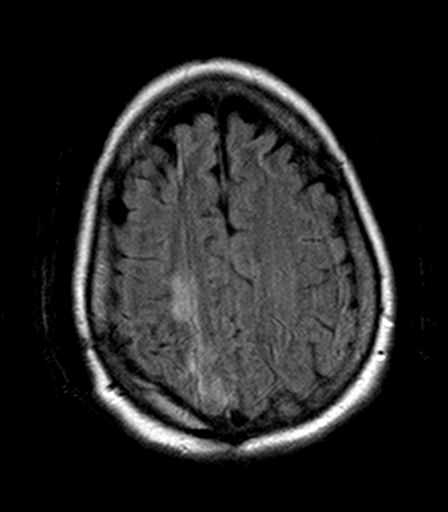
[im 24/24]
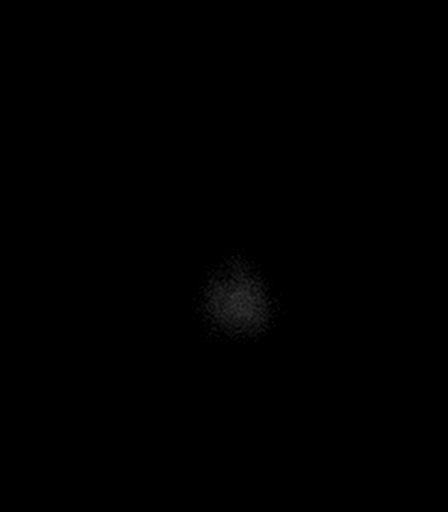

[Series 10: T2 · axial · 5.0mm · 0.72mm/px · z∈[-65,+73]mm · 4 of 24 slices shown (1 of 2)]
[im 1/24]
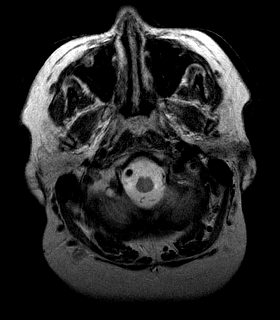
[im 8/24]
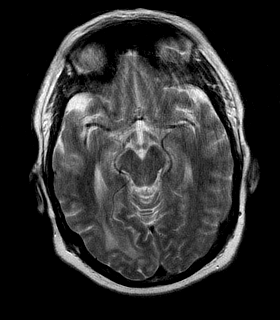
[im 16/24]
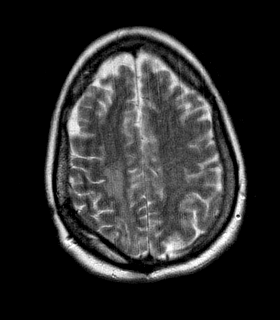
[im 24/24]
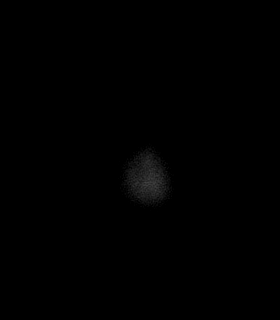

[Series 11: T2 · coronal · 5.0mm · 0.69mm/px · 5 of 26 slices shown (2 of 2)]
[im 1/26]
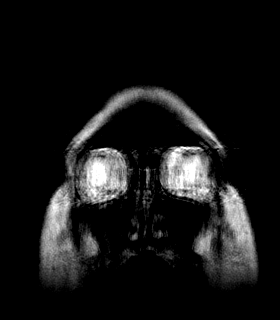
[im 7/26]
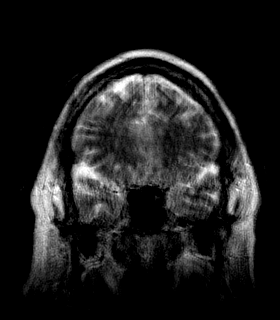
[im 13/26]
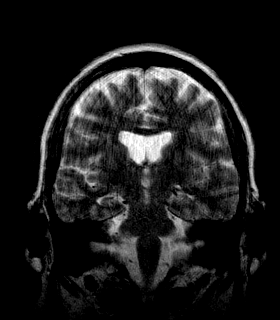
[im 19/26]
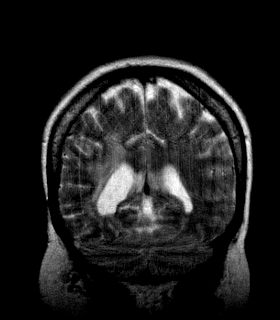
[im 26/26]
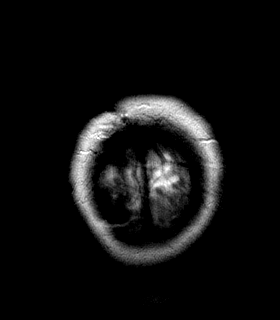

[Series 12: T1 fat-sat post-contrast · axial · 5.0mm · 0.90mm/px · z∈[-57,+79]mm · 4 of 24 slices shown (1 of 2)]
[im 1/24]
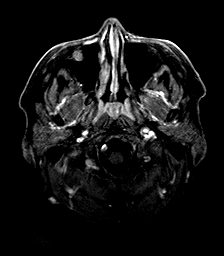
[im 8/24]
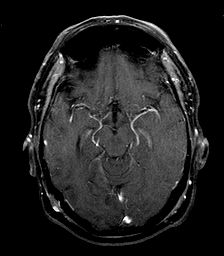
[im 16/24]
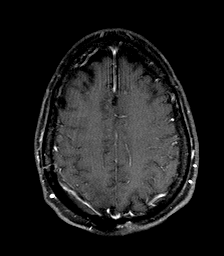
[im 24/24]
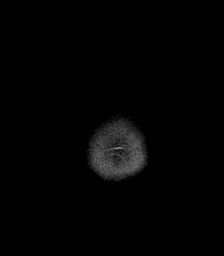

[Series 13: T1 fat-sat post-contrast · coronal · 5.0mm · 0.90mm/px · 5 of 26 slices shown (2 of 2)]
[im 1/26]
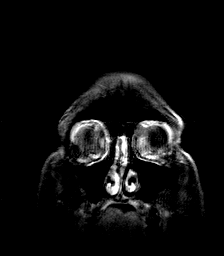
[im 7/26]
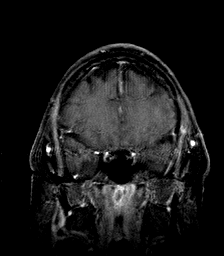
[im 13/26]
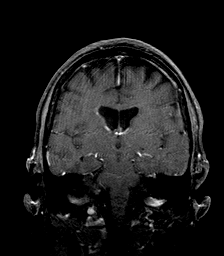
[im 19/26]
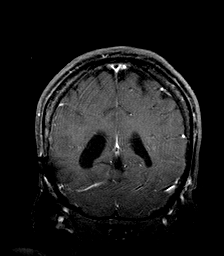
[im 26/26]
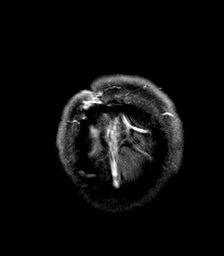

[46 of 48 positions shown; findings below may reference images not displayed]

EXAM

MRI brain without and with contrast.

INDICATION

CONFUSION ACUTE ONSET WEAKNESS PRESYNCOPE
Diagnosed with brain tumor 1 year ago.  Two month follow-up  Patient claustraphiboic - IV adavan
given.  Patient experienced increased weakness of left leg yesterday, which subsided as the day went
(..)

FINDINGS

The prior study was reviewed from 07/12/2019.

Sagittal T1 and axial T1, T2, FLAIR, gradient echo and diffusion weighted images of the brain were
obtained.  Axial and coronal post-contrast T1 weighted images were obtained after an IV dose of
contrast.

There are post craniotomy changes of the right parietal and occipital lobe.  A rounded area of
fluid signal with a thin rim of enhancement is again identified at the site of the prior surgery.
This measures 3.8 x 2.8 cm in transverse and AP diameter appears unchanged from the prior study.
Mild overlying dural enhancement appears unchanged.

Increased signal throughout the deep white matter of the right parietal and occipital lobes and
posterior aspect of the right temporal lobe appear unchanged.

There is no restricted diffusion.

No new lesions are identified.

IMPRESSION

Stable rounded area of encephalomalacia at the site of prior tumor resection of the posterior
aspect of the right cerebral hemisphere.  A thin rim of enhancement persists in appears unchanged.
Mild overlying dural enhancement appears unchanged.  Postop changes of the previous right craniotomy
appears stable.

There are no new lesions.

There is no restricted diffusion and no evidence of acute infarct.

Tech Notes:

Diagnosed with brain tumor 1 year ago.  Two month follow-up
Patient claustraphiboic - IV adavan given.
Patient experienced increased weakness of left leg yesterday, which subsided as the day went on.
Patient recieved 15cc gadavist.
BG

## 2019-09-11 ENCOUNTER — Emergency Department: Admit: 2019-09-11 | Discharge: 2019-09-11 | Payer: Commercial Managed Care - PPO

## 2019-09-11 ENCOUNTER — Encounter: Admit: 2019-09-11 | Discharge: 2019-09-11 | Payer: Commercial Managed Care - PPO

## 2019-09-11 ENCOUNTER — Emergency Department: Admit: 2019-09-11 | Discharge: 2019-09-12 | Payer: Commercial Managed Care - PPO

## 2019-09-11 DIAGNOSIS — I10 Essential (primary) hypertension: Secondary | ICD-10-CM

## 2019-09-11 DIAGNOSIS — F909 Attention-deficit hyperactivity disorder, unspecified type: Secondary | ICD-10-CM

## 2019-09-11 DIAGNOSIS — R569 Unspecified convulsions: Secondary | ICD-10-CM

## 2019-09-11 DIAGNOSIS — G40209 Localization-related (focal) (partial) symptomatic epilepsy and epileptic syndromes with complex partial seizures, not intractable, without status epilepticus: Secondary | ICD-10-CM

## 2019-09-11 DIAGNOSIS — F329 Major depressive disorder, single episode, unspecified: Secondary | ICD-10-CM

## 2019-09-11 DIAGNOSIS — E079 Disorder of thyroid, unspecified: Secondary | ICD-10-CM

## 2019-09-11 LAB — CBC AND DIFF
Lab: 0 10*3/uL (ref 0–0.20)
Lab: 0.1 10*3/uL (ref 0–0.45)
Lab: 0.3 10*3/uL (ref 0–0.80)
Lab: 1.5 10*3/uL (ref 1.0–4.8)
Lab: 21 — ABNORMAL HIGH (ref <20.7)
Lab: 4 M/UL (ref 4.0–5.0)
Lab: 6.3 10*3/uL (ref 4.5–11.0)

## 2019-09-11 LAB — URINALYSIS DIPSTICK REFLEX TO CULTURE
Lab: NEGATIVE % (ref 0–2)
Lab: NEGATIVE % (ref 0–5)
Lab: NEGATIVE % (ref 24–44)
Lab: NEGATIVE % (ref 41–77)
Lab: NEGATIVE 10*3/uL (ref 1.8–7.0)
Lab: NEGATIVE FL (ref 7–11)

## 2019-09-11 LAB — URINALYSIS MICROSCOPIC REFLEX TO CULTURE

## 2019-09-11 MED ORDER — LEVETIRACETAM 750 MG PO TAB
750 mg | ORAL_TABLET | Freq: Two times a day (BID) | ORAL | 2 refills | 90.00000 days | Status: DC
Start: 2019-09-11 — End: 2019-09-17

## 2019-09-11 MED ORDER — LEVETIRACETAM 150ML IVPB
2000 mg | Freq: Once | INTRAVENOUS | 0 refills | Status: CP
Start: 2019-09-11 — End: ?
  Administered 2019-09-12 (×2): 2000 mg via INTRAVENOUS

## 2019-09-11 NOTE — ED Notes
Daughter called stating she is concerned seizures may be "grand mal or absent seizures." Updated on pt status.

## 2019-09-12 LAB — COMPREHENSIVE METABOLIC PANEL
Lab: 0.5 mg/dL (ref 0.3–1.2)
Lab: 0.7 mg/dL (ref 0.4–1.00)
Lab: 107 MMOL/L (ref 98–110)
Lab: 13 U/L (ref 7–40)
Lab: 142 MMOL/L (ref 137–147)
Lab: 15 mg/dL (ref 7–25)
Lab: 27 MMOL/L (ref 21–30)
Lab: 3.8 MMOL/L (ref 3.5–5.1)
Lab: 4 g/dL (ref 3.5–5.0)
Lab: 56 U/L (ref 25–110)
Lab: 6.9 g/dL (ref 6.0–8.0)
Lab: 9.2 mg/dL (ref 8.5–10.6)
Lab: 91 mg/dL (ref 70–100)
Lab: >60 mL/min (ref >60)
Lab: >60 mL/min (ref >60)
Lab: 8 (ref 3–12)

## 2019-09-12 LAB — TSH WITH FREE T4 REFLEX: Lab: 0.7 uU/mL (ref 0.35–5.00)

## 2019-09-12 NOTE — ED Notes
Pt given discharge instructions and prescription. Pt denies further questions. Pt signed paperwork. Pt has all belongings in possession at this time. Pt off unit via wheelchair to family members car.

## 2019-09-12 NOTE — ED Notes
Pt identified at this time. Pt AAO4. Breathing even and non-labored. Skin appropriate for race. Pt attached to VS monitor.

## 2019-09-12 NOTE — ED Notes
Andrea [name] is a 48 y.o. female who presents to ED with CC of Seizure. Pt states she was at home today walking to the bathroom when she began feeling weak and her children told her to sit down. Per children, pt had grand mal or focal seizure. Pt states the next thing she remembered EMS was there. Pt with seizure after removal of known glioblastoma the first time, but then the tumor came back. Pt denies seizures since recurrence. Pt is alert and oriented, ambulates with steady gait, skin PWD, lungs CTA. Side rails padded. Pt placed on cardiac monitor, resting on cart in lowest and locked position with call light in reach. No needs identified at this time.    Medical History:   Diagnosis Date    ADHD     Depression     Disorder of thyroid gland     Hypertension        Belongings include: 1 shirt - cut by EMS, 1 pants, 2 shoes, 2 socks, 1 cell phone, 1 box meds

## 2019-09-12 NOTE — Consults
Neurology Consult/H&P Note      Admission Date: 09/11/2019                                                LOS: 0 days    Reason for Consult:      Consult type: Opinion with orders    Assessment: 48 y.o. 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 Likely localization-related in light of h/o brain tumor and resection.     Exam: CN: Left pupil smaller - mild anisocoria. Left hand and ADF weakness. LUE > RUE action tremor. Diffuse hyperreflexia   Labs:  TSH - 0.76  CBC- unremarkable  UA - neg  CMP - pending   Imaging:  CT head: Stable appearance of the brain with previous right posterior craniotomy, underlying extra-axial fluid collection and dural thickening, cystic resection cavity, adjacent encephalomalacia and gliosis. No intracranial hemorrhage or new intracranial mass effect.         Recommendation:  - Recommend IV keppra load 2gm x 1  - Would start Keppra 750mg  BID tomorrow and continue indefinitely  - Seizure precautions discussed: no driving, tub baths, swimming alone, operating heavy machinery, climbing to heights, care of small children without assist.     - Recommend OPT PSG to assess for sleep d/o d/t snoring and EDS- PCP can arrange  - Can FU with neurology to manage keppra vs Neuro-oncology may also be comfortable prescribing this agent  - SE profile discussed  - After keppra load OK to DC home from neuro standpoint with Rx for maintenance Keppra 750mg  BID    Alden Hipp, DO  Clinical Assistant Professor  Department of Neurology     Neurology Private Consult Service:  PAGER: [phone] .   Pages will be returned by this service attending from the hours of 2PM-10PM M-F and 1200-1000PM Sat and Sun.  Urgent questions can otherwise be directed to the Neurology teaching service at pager [phone]. The patient was evaluated for an acute change in neurologic status/an illness that may pose threat to life or function. I reviewed lab data, personally reviewed neuroimaging (CT and past MRI). Case was discussed with family and ED team.      ______________________________________________________________________    History of Present Illness: Andrea [name] is a 48 y.o. 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 Was in normal state of health and came in from outdoors and smoking cigarette when she says she felt funny, had a headache,and needed to go to the bathroom. She then had movement arrest, her head began bobbing like no-no. she had leg shaking and her eye were open and staring straight ahead. She was assisted to chair and looked like bilateral face was drooping and she may have been trying to speak but was unable. This shaking and head bobbing continued off and on over 5-10 minutes and afterwards she was dazed, non-verbal, and was crying. She recalls nothing of the event and next recalls being in ambulance. She did not have oral trauma nor loss of continence.  She took about 30-40 min to return to baseline and now feels tired.     Has a  smaller, but similar event, 1 week ago. She was outside and felt funny and like her legs were going to give out. Before this, she had been paced on GBP for neuropathic hand and foot pain and had not been sleeping well. GBP was reduced as event was attributed to medication side effect. She did not have shaking or LOC with that event.    She has no ill sxs. Reports poor sleep. Baseline LUE>RUE tremors. No HA. She was on Keppra post op for a time but this was stopped after the post op period. She doe snot drink EtOH regularly. No family h/o seizure and no major TBI with LOC.     Medical History:   Diagnosis Date   ? ADHD    ? Depression ? Disorder of thyroid gland    ? Hypertension        Surgical History:   Procedure Laterality Date   ? Right craniotomy for resection of tumor Right 08/27/2018    Performed by Theotis Barrio, MD at [facility]   ? MICROSURGICAL TECHNIQUES - REQUIRING OPERATING MICROSCOPE USE N/A 08/27/2018    Performed by Theotis Barrio, MD at [facility]   ? STEREOTACTIC COMPUTER-ASSISTED CRANIAL PROCEDURE - INTRADURAL N/A 08/27/2018    Performed by Theotis Barrio, MD at [facility]   ? ULTRASOUND GUIDANCE - INTRAOPERATIVE  08/27/2018    Performed by Theotis Barrio, MD at [facility]   ? CRANIECTOMY/ BONE FLAP CRANIOTOMY EXCISION SUPRATENTORIAL BRAIN TUMOR Right 08/28/2018    Performed by [facility]rio, [facility] at [facility]   ? CHOLECYSTECTOMY     ? HX APPENDECTOMY     ? HX HYSTERECTOMY     ? HX JOINT REPLACEMENT     ? HYSTERECTOMY         Social History     Tobacco Use   ? Smoking status: Current Every Day Smoker     Packs/day: 0.25     Years: 30.00     Pack years: 7.50     Types: Cigarettes   ? Smokeless tobacco: Never Used   Substance Use Topics   ? Alcohol use: Yes     Frequency: Never     Comment: Seldom   ? Drug use: Never       Family History   Problem Relation Age of Onset   ? Blindness Neg Hx    ? Glaucoma Neg Hx    ? Macular Degen Neg Hx        Allergies:  Morphine, Codeine, Erythromycin, Latex, and Nsaids (non-steroidal anti-inflammatory drug)    Scheduled Meds:Continuous Infusions:  PRN and Respiratory Meds:    No current facility-administered medications on file prior to encounter.      Current Outpatient Medications on File Prior to Encounter   Medication Sig Dispense Refill   ? acetaminophen (TYLENOL) 325 mg tablet Take two tablets by mouth every 6 hours as needed for Pain.     ? ALPRAZolam (XANAX) 0.5 mg tablet Take 1 mg by mouth three times daily as needed for Anxiety PO.  1   ? apixaban (ELIQUIS) 5 mg tablet Take one tablet by mouth twice daily. Indications: a clot in the lung 60 tablet 11 ? buPROPion XL (WELLBUTRIN XL) 150 mg tablet Take one tablet by mouth daily. Do not crush or chew. 30 tablet 1   ? buspirone HCl (BUSPAR PO) Take 7.5 mg by mouth twice daily.     ? calcium carbonate (TUMS) 500 mg (200 mg elemental calcium)  chewable tablet Chew 500 mg by mouth as Needed.     ? celecoxib (CELEBREX) 200 mg capsule Take 1 capsule by mouth twice daily 90 capsule 0   ? cyclosporine (RESTASIS) 0.05 % ophthalmic emulsion Apply one drop to both eyes twice daily. Fill largest quantity covered  Indications: keratoconjunctivitis sicca, or dry and inflamed cornea and conjunctiva of the eye 90 each 3   ? dexAMETHasone (DECADRON) 4 mg tablet Take 4 mg by mouth twice daily. Take with food.     ? docusate (COLACE) 100 mg capsule Take 100-200 mg by mouth twice daily as needed for Constipation.     ? duloxetine DR (CYMBALTA) 60 mg capsule Take one capsule by mouth daily. 30 capsule 1   ? furosemide (LASIX) 40 mg tablet Take 40 mg by mouth daily as needed (leg swelling).     ? granisetron (KYTRIL) 1 mg tablet Take one tablet by mouth every 12 hours as needed for Nausea. 60 tablet 11   ? HYDROcodone/acetaminophen (NORCO) 5/325 mg tablet Take 1 tablet by mouth every 6 hours as needed for Pain     ? levothyroxine (SYNTHROID) 150 mcg tablet Take 150 mcg by mouth daily 30 minutes before breakfast.     ? levothyroxine (SYNTHROID) 200 mcg tablet Take 200 mcg by mouth daily 30 minutes before breakfast.     ? losartan-hydrochlorothiazide (HYZAAR) 100-12.5 mg tablet Take 1 tablet by mouth every morning. Patient take 1/2 tablet daily.     ? polyethylene glycol 3350 (MIRALAX) 17 g packet Take one packet by mouth daily. (Patient taking differently: Take 17 g by mouth as Needed.) 12 each    ? temozolomide (TEMODAR) 20 mg capsule Take one capsule (20 mg) by mouth twice daily with 1 other temozolomide prescription for 25 mg per dose. 60 capsule 0 ? temozolomide (TEMODAR) 5 mg capsule Take one capsule (5 mg) by mouth twice daily with 1 other temozolomide prescription for 25 mg per dose. 60 capsule 0   ? vitamins, multiple cap Take 1 capsule by mouth daily.         Review of Systems:  All other systems reviewed and are negative. + tremors, fatigue. No CP, SOB, and pain, limb weakness, numbness, tingling. No falls, confusion, or speech changes.     Vital Signs:  Last Filed in 24 hours Vital Signs:  24 hour Range    BP: 160/87 (11/15 1536)  Pulse: 63 (11/15 1715)  Respirations: 11 PER MINUTE (11/15 1715)  SpO2: 96 % (11/15 1715)  SpO2 Pulse: 63 (11/15 1715)  Height: 167.6 cm (66) (11/15 1537) BP: (160)/(87)   Pulse:  [63-73]   Respirations:  [11 PER MINUTE-29 PER MINUTE]   SpO2:  [94 %-100 %]        General physical exam:    HEENT: normocephalic, eyes open with no discharge, nares patent, oropharynx is clear with no lesions, palate intact  CV: regular rate and rhythm, no murmur, distal pulses palpable  Chest: normal configuration, lungs are clear bilaterally  Ab: soft, non-tender, no masses, no organomegaly  Skin: no rashes or lesions    Neuro exam:   Mental status: Awake, alert, and oriented to person, place, time and location/situation    Speech:    Normal Abnormal   Fluency x    Comprehension x    Articulation x    Repetition x    Naming x        Cranial Nerves:    Normal Abnormal   II  round, reactive R pupil 4mm and Left 3mm    III, IV, VI EOMI, no nystagmus    V Intact to LT in V1-3    VII Face symmetrical, eye closure symmetrical    VIII Hearing intact to finger rub    IX, X Uvula midline and palate elevation symmetrical    XI Shrug equal     XII Tongue midline        Muscle/motor:   Tone: nml  Bulk: nml     Right  Left   Right  Left    Shoulder abductors:  5  5 Hip flexors:  5  4+   Elbow flexors:  5  5 Hip abductors:      Elbow extensors:  5  5 Hip extensors:      Wrist extensors:    Hip adductors:      Wrist flexors:    Knee flexors:  5  5 Finger flexors:  5  4 Knee extensors:  5  5   Finger extensors:    Ankle plantar flexors:  5  4   Finger abductors:  5  4 Ankle dorsiflexors:  5  5   Thumb abductors:    Ankle inversion:         Ankle eversion:         Toe extensors:         Toe flexors:      No abnormal movements, rigidity or spasticity    Reflexes:      Right Left   Triceps 3 3   Biceps 2 3   Brachioradialis 3 3   Patella 3 3   Ankle 2 2   Plantar     Other reflexes:  Hoffman: absent    Sensation:    Normal RUE LUE RLE LLE   Light Touch x       Pin Prick        Temperature x       Vibration        Proprioception            Coordination:    Normal Abnormal Right Abnormal Left   Finger to Nose x End point tremor Endpoint tremor    Rapid alternating       Heel to Shin      Finger tap x     Foot tap x     Other        Gait and Station:  Deferred         Lab/Radiology/Other Diagnostic Tests:  24-hour labs:    Results for orders placed or performed during the hospital encounter of 09/11/19 (from the past 24 hour(s))   CBC AND DIFF    Collection Time: 09/11/19  4:02 PM   Result Value Ref Range    White Blood Cells 6.3 4.5 - 11.0 K/UL    RBC 4.01 4.0 - 5.0 M/UL    Hemoglobin 12.4 12.0 - 15.0 GM/DL    Hematocrit 16.1 36 - 45 %    MCV 90.6 80 - 100 FL    MCH 31.0 26 - 34 PG    MCHC 34.2 32.0 - 36.0 G/DL    RDW 09.6 (H) 11 - 15 %    Platelet Count 217 150 - 400 K/UL    MPV 7.5 7 - 11 FL    Neutrophils 66 41 - 77 %    Lymphocytes 25 24 - 44 %    Monocytes 6 4 - 12 %  Eosinophils 2 0 - 5 %    Basophils 1 0 - 2 %    Absolute Neutrophil Count 4.15 1.8 - 7.0 K/UL    Absolute Lymph Count 1.58 1.0 - 4.8 K/UL    Absolute Monocyte Count 0.35 0 - 0.80 K/UL    Absolute Eosinophil Count 0.11 0 - 0.45 K/UL    Absolute Basophil Count 0.06 0 - 0.20 K/UL    MDW (Monocyte Distribution Width) 21.6 (H) <20.7   TSH WITH FREE T4 REFLEX    Collection Time: 09/11/19  4:02 PM   Result Value Ref Range    TSH 0.76 0.35 - 5.00 MCU/ML   URINALYSIS DIPSTICK REFLEX TO CULTURE Collection Time: 09/11/19  4:06 PM    Specimen: Urine   Result Value Ref Range    Color,UA YELLOW     Turbidity,UA CLEAR CLEAR-CLEAR    Specific Gravity-Urine 1.010 1.003 - 1.035    pH,UA 6.0 5.0 - 8.0    Protein,UA NEG NEG-NEG    Glucose,UA NEG NEG-NEG    Ketones,UA NEG NEG-NEG    Bilirubin,UA NEG NEG-NEG    Blood,UA NEG NEG-NEG    Urobilinogen,UA NORMAL NORM-NORMAL    Nitrite,UA NEG NEG-NEG    Leukocytes,UA NEG NEG-NEG    Urine Ascorbic Acid, UA NEG NEG-NEG   URINALYSIS MICROSCOPIC REFLEX TO CULTURE    Collection Time: 09/11/19  4:06 PM    Specimen: Urine   Result Value Ref Range    WBCs,UA 0-2 0 - 2 /HPF    RBCs,UA 2-10 0 - 3 /HPF    Comment,UA       Criteria for reflex to culture are WBC>10, Positive Nitrite, and/or >=+1   leukocytes. If quantity is not sufficient, an addendum will follow.      MucousUA 1+     Squamous Epithelial Cells 2-5 0 - 5         Amanada Philbrick, DO  Clinical Assistant Professor  Department of Neurology     Neurology Private Consult Service:  PAGER: [phone] .   Pages will be returned by this service attending from the hours of 2PM-10PM M-F and 1200-1000PM Sat and Sun.  Urgent questions can otherwise be directed to the Neurology teaching service at pager [phone].

## 2019-09-13 ENCOUNTER — Encounter: Admit: 2019-09-13 | Discharge: 2019-09-13 | Payer: Commercial Managed Care - PPO

## 2019-09-13 ENCOUNTER — Emergency Department: Admit: 2019-09-13 | Discharge: 2019-09-13 | Payer: Commercial Managed Care - PPO

## 2019-09-13 DIAGNOSIS — E079 Disorder of thyroid, unspecified: Secondary | ICD-10-CM

## 2019-09-13 DIAGNOSIS — F329 Major depressive disorder, single episode, unspecified: Secondary | ICD-10-CM

## 2019-09-13 DIAGNOSIS — F909 Attention-deficit hyperactivity disorder, unspecified type: Secondary | ICD-10-CM

## 2019-09-13 DIAGNOSIS — I10 Essential (primary) hypertension: Secondary | ICD-10-CM

## 2019-09-13 MED ORDER — LORAZEPAM 2 MG/ML IJ SOLN
2 mg | Freq: Once | INTRAVENOUS | 0 refills | Status: CP
Start: 2019-09-13 — End: ?

## 2019-09-13 MED ORDER — LEVETIRACETAM 150ML IVPB
2000 mg | Freq: Once | INTRAVENOUS | 0 refills | Status: CP
Start: 2019-09-13 — End: ?
  Administered 2019-09-14 (×2): 2000 mg via INTRAVENOUS

## 2019-09-13 MED ORDER — ACETAMINOPHEN 500 MG PO TAB
1000 mg | Freq: Once | ORAL | 0 refills | Status: CP
Start: 2019-09-13 — End: ?
  Administered 2019-09-14: 06:00:00 1000 mg via ORAL

## 2019-09-13 MED ORDER — METOCLOPRAMIDE HCL 5 MG/ML IJ SOLN
10 mg | Freq: Once | INTRAVENOUS | 0 refills | Status: CP
Start: 2019-09-13 — End: ?
  Administered 2019-09-14: 06:00:00 10 mg via INTRAVENOUS

## 2019-09-13 MED ORDER — DEXAMETHASONE IVPB
20 mg | Freq: Once | INTRAVENOUS | 0 refills | Status: CP
Start: 2019-09-13 — End: ?

## 2019-09-14 ENCOUNTER — Encounter: Admit: 2019-09-14 | Discharge: 2019-09-14 | Payer: Commercial Managed Care - PPO

## 2019-09-14 ENCOUNTER — Emergency Department: Admit: 2019-09-14 | Discharge: 2019-09-13 | Payer: Commercial Managed Care - PPO

## 2019-09-14 LAB — POC GLUCOSE: Lab: 91 mg/dL (ref 70–100)

## 2019-09-14 LAB — COMPREHENSIVE METABOLIC PANEL
Lab: 0.3 mg/dL (ref 0.3–1.2)
Lab: 0.8 mg/dL (ref 0.4–1.00)
Lab: 107 MMOL/L (ref 98–110)
Lab: 11 U/L (ref 7–56)
Lab: 13 mg/dL (ref 7–25)
Lab: 142 MMOL/L (ref 137–147)
Lab: 16 U/L (ref 7–40)
Lab: 26 MMOL/L (ref 21–30)
Lab: 3.9 MMOL/L (ref 3.5–5.1)
Lab: 3.9 g/dL (ref 3.5–5.0)
Lab: 60 U/L (ref 25–110)
Lab: 7.1 g/dL (ref 6.0–8.0)
Lab: 9 10*3/uL (ref 3–12)
Lab: 9.2 mg/dL (ref <20.7)
Lab: 90 mg/dL (ref 70–100)
Lab: >60 mL/min (ref >60)
Lab: >60 mL/min (ref >60)

## 2019-09-14 LAB — CBC AND DIFF
Lab: 0 10*3/uL (ref 0–0.20)
Lab: 0.1 10*3/uL (ref 0–0.45)
Lab: 17 (ref <20.7)
Lab: 7.9 10*3/uL (ref 4.5–11.0)

## 2019-09-14 LAB — POC TROPONIN: Lab: 0 ng/mL (ref 0.00–0.05)

## 2019-09-14 MED ORDER — DIVALPROEX 500 MG PO TBEC
1000 mg | ORAL_TABLET | Freq: Three times a day (TID) | ORAL | 0 refills | 30.00000 days | Status: DC
Start: 2019-09-14 — End: 2019-09-16

## 2019-09-14 MED ORDER — VALPROIC ACID IVPB
1000 mg | Freq: Once | INTRAVENOUS | 0 refills | Status: DC
Start: 2019-09-14 — End: 2019-09-14

## 2019-09-14 MED ORDER — LORAZEPAM 2 MG PO TAB
2 mg | ORAL_TABLET | ORAL | 0 refills | 12.00000 days | Status: AC | PRN
Start: 2019-09-14 — End: ?

## 2019-09-14 MED ORDER — VALPROIC ACID IVPB
20 mg/kg | Freq: Once | INTRAVENOUS | 0 refills | Status: CP
Start: 2019-09-14 — End: ?
  Administered 2019-09-14 (×2): 2722 mg via INTRAVENOUS

## 2019-09-14 MED ADMIN — DEXAMETHASONE SODIUM PHOS (PF) 10 MG/ML IJ SOLN [136313]: INTRAVENOUS | @ 05:00:00 | Stop: 2019-09-14 | NDC 63323050601

## 2019-09-14 MED ADMIN — LORAZEPAM 2 MG/ML IJ SOLN [10467]: 2 mg | INTRAVENOUS | @ 05:00:00 | Stop: 2019-09-14 | NDC 00641604801

## 2019-09-14 NOTE — ED Notes
Seizure x2 at bedside, witnessed by myself, resident, and ER attending. Decadron 20mg  and Ativan 2mg  administered.

## 2019-09-14 NOTE — Telephone Encounter
Daughter called to update [place] that pt was in the ER again and came home early this morning as she was having seizure activity. She reports neurology  put pt on Depakote and continued Keppra and she is asleep. Daughter concerned about Keppra as pt "gets mean" on it and is concerned how to handle seizures. Reinforced directions given by neuro to use rescue Ativan at first signs of seizure activity and if no relief and she is seizing for >5 minutes they should go back to ER. Reinforced we need to give it time for her drug levels to get therapeutic. Pt scheduled with neuro tomorrow and [place] on Tues and told her we should be able to make further decisions at that time. Daughter verbalizes understanding and will discuss concerns about Keppra with neuro team.

## 2019-09-14 NOTE — ED Notes
Patient presents via EMS from home. EMS reports family informed them the patient had seven seizures at home beginning at 37. Patient has a history of brain cancer with resection in 2019. Patient developed seizures this week on Sunday and was seen here and evaluated and started on Keppra, and was discharged. Patient was then evaluated at [facility] yesterday and was given an IV bolus of Keppra. When EMS arrived, patient was having severe headaches. Patient was given 241mcg Fentanyl in route and 10mg  Valium due to seizures in route. Patient is alert and orientated to self, situation, place, and time. Patient is still complaining of headache and reports she was feeling "funny" before seizures. IV and bloodwork initiated. Patient placed in seizure precautions, oxygen and suction at bedside. Patient remains on full monitor and will continue to monitor.     Belongings: shoes, socks, pants, shirt, bra, phone, and glasses

## 2019-09-15 ENCOUNTER — Encounter: Admit: 2019-09-15 | Discharge: 2019-09-15 | Payer: Commercial Managed Care - PPO

## 2019-09-15 ENCOUNTER — Ambulatory Visit: Admit: 2019-09-15 | Discharge: 2019-09-16 | Payer: Commercial Managed Care - PPO

## 2019-09-15 DIAGNOSIS — C719 Malignant neoplasm of brain, unspecified: Secondary | ICD-10-CM

## 2019-09-15 DIAGNOSIS — F329 Major depressive disorder, single episode, unspecified: Secondary | ICD-10-CM

## 2019-09-15 DIAGNOSIS — I10 Essential (primary) hypertension: Secondary | ICD-10-CM

## 2019-09-15 DIAGNOSIS — F909 Attention-deficit hyperactivity disorder, unspecified type: Secondary | ICD-10-CM

## 2019-09-15 DIAGNOSIS — E079 Disorder of thyroid, unspecified: Secondary | ICD-10-CM

## 2019-09-15 NOTE — Progress Notes
[facility]  Epilepsy Division  New Patient Visit  -----------------------------------------------------------------------------------------------------------------------------------------    Chief complaint: seizures    Sources of history: patient  and previous medical records were reviewed and are summarized as part of the description and testing review below.     History of present illness:    Patient is a 48 y.o. 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 Per review of witnessed accounts, patient was on her way to the bathroom when she acutely lost consciousness and fell to the floor. She started having twitching on the right side of the face followed by leg shaking. She had her eyes open, lips moving. She was guided to a chair where she continued to have blank staring, periodic tremors. She started returning to baseline, however, amnestic to the event.      Per patient, has a funny feeling that comes over her. She starts having a sharp pain going across the front and back of the head. She is awake of what is going on around her, but unable to speak. Has had head deviation to the left side with these as well. Since last seen in the ER on 11/17, she has had staring but no progression.      Of note, patient had increased wellbutrin at the beginning of the month to 300mg  (from 150mg  BID). 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 States that she has been having poor sleep over the last several months, 2/2 to medical condition.     Currently on Keppra 750mg  twice daily, Depakote 1000mg  TID. Diagnosed with glioblastoma in 2019.   ?    Epilepsy risk factors:   1. Head Trauma (no)  2. CNS Infections (no)  3. Family History of Seizures (no)   4. Developmental Delay (no)  5. Febrile Seizures (no)  6. CNS Tumors (no)  7. CNS Vascular Disease (no)  8. Significant Medical History: see below  9. Birth and early development: normal ===============================================    SEIZURE/ EVENT TYPES/ DESCRIPTION (Semiology)/ FREQUENCY  Type 1:  Description: Funny feeling that comes over her. She starts having a sharp pain going across the front and back of the head. She is awake of what is going on around her, but unable to speak. Has witnessed lip smacking had head deviation to the left side and diffuse   Alteration of awareness: Partial  Duration: Variable  Frequency this visit: Multiple this week, first incident occurence. Last 11/17, medication increased at that time.     AEDs  Current AEDs: Keppra 750mg  BID, VPA 1000mg  TID  Past failed AEDs: None       EVALUATION/TESTING:  Physiological Testing  Routine EEG: -  EMU/ vEEG: -    Basic Structural Imaging  HCT:   MRI brain:   1   MRI HEAD WO/W CONTRAST    Narrative    MRI head    HISTORY:    [name]    TECHNIQUE:    Multiplanar, multisequence MRI images were obtained through the head with and without contrast. MultiHance was injected.    FINDINGS:    There has been mild decrease size of the right parietal occipital resection cavity since the neuronavigational planning MRI on September 13, 2018. There continues to be heterogeneous complex debris or tissue with intermediate and high T1 signal within the inferior portion of the cavity. Today the cavity measures approximately 4.8 x 3.6 cm compared to 5.1 x 4.8 cm previously. There is some thin though  slightly irregular enhancement along the margin of the cavity.    FLAIR hyperintensity/edema surrounding the cavity has decreased since the November examinations.    Elsewhere, there is no new signal abnormality or enhancing lesion within the brain. Diffusion-weighted sequences are unremarkable. Ventricle sizes are within normal limits. There is no midline shift.    There is a small amount of extra-axial fluid and chronic blood products immediately deep to the craniotomy, superficial to the dura measuring up to 4 mm. There is a small caliber of the right transverse sinus. There is possibly small amount of scarring or chronic appearing thrombus within the mid to lateral transverse sinus (images 43 through 47 of series 15)    There is sphenoid, posterior ethmoid, and maxillary sinus mucosal thickening and fluid.      Impression    1. Overall decrease size of the right parieto-occipital resection and treatment cavity compared to September 13, 2018. There continues to be complex heterogeneous debris or soft tissue within the inferior portion of the cavity without significant enhancement. Small amount of thin peripheral cavity enhancement. This study will serve as a good baseline for subsequent follow-up imaging.  2. Decreased FLAIR hyperintensity/edema surrounding the resection cavity.  3. No new signal abnormality or enhancement.  4. Small amount of scarring or nonocclusive thrombus within the mid to lateral right transverse sinus.  5. Bilateral perinasal sinus inflammatory mucosal thickening and fluid.       Finalized by Marily Memos, M.D. on 11/12/2018 3:51 PM. Dictated by Marily Memos, M.D. on 11/12/2018 3:31 PM.     1   MRI HEAD WO/W CONTRAST    Narrative    EXAM: MRI BRAIN     HISTORY:     48 year old female, follow-up after right craniotomy and mass resection.    TECHNIQUE: Multiplanar and multisequence MR imaging of the head was performed. This was done both before and after the administration of MultiHancecontrast.    COMPARISON: MRI brain August 26, 2018.     FINDINGS:     Interval right parietal craniotomy and mass resection. Small amount of postoperative gas, fluid, and blood products deep to the craniotomy, demonstrating mixed T2 signal and measuring up to 1.5 cm in aggregate thickness (series 15, image 16). There is mild postoperative pneumocephalus along the anterior right frontal and temporal convexities. Moderate hemorrhage is seen within the operative bed. There is residual peripheral linear enhancement along the anterior inferior margin of the resection bed measuring 2.3 x 1.8 x 1.1 cm (series 22, image 97 and series 21, image 8). Some thin right cerebral convexity dural enhancement is noted, likely postoperative.    There has been improvement in right cerebral mass effect with improved now 0.4 cm right-to-left midline shift and improvement effacement of the right lateral ventricle. Improvement in right cerebral hemispheric sulcal effacement and resolution of early right uncal herniation. No significant change in FLAIR signal abnormality within the right posterior cerebral hemisphere and along the bilateral rostral callosum.    The vascular flow-voids are unremarkable. Diffusion weighted imaging is not indicative of acute or recent infarct.      Impression    1. Interval right parietal craniotomy and mass resection, with expected hemorrhage within the operative cavity. There is mild postoperative pneumocephalus and a relatively small amount of postoperative gas, fluid, and blood products deep to the craniotomy.  2. Residual peripheral enhancement along the anterior inferior origin of the resection bed, most compatible with residual tumor 3  3. Improvement in right cerebral mass  effect with improving leftward shift and sulcal effacement, with resolved or nearly uncal herniation.  4. No significant change in FLAIR signal abnormality within the right posterior cerebral hemisphere and along the bilateral rostral callosum.    By my electronic signature, I attest that I have personally reviewed the images for this examination and formulated the interpretations and opinions expressed in this report       Finalized by Sherolyn Buba, M.D. on 08/28/2018 2:31 AM. Dictated by Carlyle Lipa, M.D. on 08/28/2018 2:02 AM.     MRI HEAD WO/W CONTRAST    Narrative    EXAM: MRI BRAIN     HISTORY:     brain mass TECHNIQUE: Multiplanar and multisequence MR imaging of the head was performed. This was done both before and after the administration of MultiHance IV contrast.    COMPARISON: No outside imaging available for comparison     FINDINGS:     There is a large heterogeneously enhancing necrotic intra-axial mass within the posterior half of the right cerebrum measuring 7.4 x 3.9 cm (series 13, image 14), with areas of internal hemorrhage. This results in localized mass effect with right cerebral sulcal effacement, 7 mm of leftward midline shift, and early right uncal herniation. There is moderate FLAIR hyperintense signal surrounding the mass, including the right temporoparietal occipital white matter, right thalamus, and the bilateral rostral callosum.    The vascular flow-voids are unremarkable. Diffusion weighted imaging is not indicative of acute or recent infarct.      Impression    1.  Large cerebral necrotic enhancing mass centered within the right posterior cerebral hemisphere, favored for high-grade glioma. Cystic metastasis is considered less likely.  2.  Localized mass effect results in right hemispheric sulcal effacement, 7 mm leftward shift, and early right uncal herniation.  3.  Moderate FLAIR hyperintensity surrounding the mass and involving the bilateral rostral callosum, which may represent a combination of vasogenic edema and/or tumor elements.      Chart review reveals the primary team is aware of these findings.       Approved by Lynder Parents, M.D. on 08/27/2018 8:17 AM    By my electronic signature, I attest that I have personally reviewed the images for this examination and formulated the interpretations and opinions expressed in this report       Finalized by Wandra Mannan, M.D. on 08/27/2018 11:52 AM. Dictated by Lynder Parents, M.D. on 08/27/2018 7:06 AM.            ===============================================      OTHER HISTORY  Past Medical History:  Medical History:   Diagnosis Date ? ADHD    ? Depression    ? Disorder of thyroid gland    ? Hypertension        Past Surgical History:  Surgical History:   Procedure Laterality Date   ? Right craniotomy for resection of tumor Right 08/27/2018    Performed by Theotis Barrio, MD at [facility] OR   ? MICROSURGICAL TECHNIQUES - REQUIRING OPERATING MICROSCOPE USE N/A 08/27/2018    Performed by Theotis Barrio, MD at [facility] OR   ? STEREOTACTIC COMPUTER-ASSISTED CRANIAL PROCEDURE - INTRADURAL N/A 08/27/2018    Performed by Theotis Barrio, MD at [facility] OR   ? ULTRASOUND GUIDANCE - INTRAOPERATIVE  08/27/2018    Performed by Theotis Barrio, MD at [facility] OR   ? CRANIECTOMY/ BONE FLAP CRANIOTOMY EXCISION SUPRATENTORIAL BRAIN TUMOR Right 08/28/2018    Performed by [facility]rio, MD at [facility]   ?  CHOLECYSTECTOMY     ? HX APPENDECTOMY     ? HX HYSTERECTOMY     ? HX JOINT REPLACEMENT     ? HYSTERECTOMY         Family History:  Family History   Problem Relation Age of Onset   ? Blindness Neg Hx    ? Glaucoma Neg Hx    ? Macular Degen Neg Hx        Social History:  Social History     Tobacco Use   ? Smoking status: Current Every Day Smoker     Packs/day: 0.25     Years: 30.00     Pack years: 7.50     Types: Cigarettes   ? Smokeless tobacco: Never Used   Substance Use Topics   ? Alcohol use: Yes     Frequency: Never     Comment: Seldom   ? Drug use: Never       Allergies:  Allergies   Allergen Reactions   ? Morphine ANAPHYLAXIS   ? Codeine RASH   ? Erythromycin SHORTNESS OF BREATH   ? Latex RASH   ? Nsaids (Non-Steroidal Anti-Inflammatory Drug) SEE COMMENTS     History of GI bleed - has been instructed to avoid NSAID's       Medications:  Home Medications:  Prior to Admission medications    Medication Sig Start Date End Date Taking? Authorizing Provider   acetaminophen (TYLENOL) 325 mg tablet Take two tablets by mouth every 6 hours as needed for Pain. 09/09/18   Arickx, Kandace Parkins, MD ALPRAZolam Prudy Feeler) 0.5 mg tablet Take 1 mg by mouth three times daily as needed for Anxiety PO. 09/20/18   HISTORICAL PROVIDER   apixaban (ELIQUIS) 5 mg tablet Take one tablet by mouth twice daily. Indications: a clot in the lung 12/23/18   Omer Jack, MD   buPROPion XL (WELLBUTRIN XL) 150 mg tablet Take one tablet by mouth daily. Do not crush or chew. 09/10/18   Arickx, Kandace Parkins, MD   buspirone HCl (BUSPAR PO) Take 7.5 mg by mouth twice daily.    HISTORICAL PROVIDER   calcium carbonate (TUMS) 500 mg (200 mg elemental calcium) chewable tablet Chew 500 mg by mouth as Needed.    HISTORICAL PROVIDER   celecoxib (CELEBREX) 200 mg capsule Take 1 capsule by mouth twice daily 07/29/19   Bartholome Bill, MD   cyclosporine (RESTASIS) 0.05 % ophthalmic emulsion Apply one drop to both eyes twice daily. Fill largest quantity covered  Indications: keratoconjunctivitis sicca, or dry and inflamed cornea and conjunctiva of the eye 11/05/18   Corky Sox, MD   dexAMETHasone (DECADRON) 4 mg tablet Take 4 mg by mouth twice daily. Take with food.    HISTORICAL PROVIDER   divalproex (DEPAKOTE EC) 500 mg DR tablet Take two tablets by mouth three times daily. Take with food. 09/14/19   Merilynn Finland, Brittani L, DO   docusate (COLACE) 100 mg capsule Take 100-200 mg by mouth twice daily as needed for Constipation.    Provider, Historical   duloxetine DR (CYMBALTA) 60 mg capsule Take one capsule by mouth daily. 09/09/18   Arickx, Kandace Parkins, MD   furosemide (LASIX) 40 mg tablet Take 40 mg by mouth daily as needed (leg swelling).    Provider, Historical   granisetron (KYTRIL) 1 mg tablet Take one tablet by mouth every 12 hours as needed for Nausea. 12/29/18   Bartholome Bill, MD   HYDROcodone/acetaminophen (NORCO) 5/325 mg tablet  Take 1 tablet by mouth every 6 hours as needed for Pain    HISTORICAL PROVIDER levETIRAcetam (KEPPRA) 750 mg tablet Take one tablet by mouth twice daily for 90 days. 09/11/19 12/10/19  Jaci Lazier L, DO   levothyroxine (SYNTHROID) 150 mcg tablet Take 150 mcg by mouth daily 30 minutes before breakfast.    HISTORICAL PROVIDER   levothyroxine (SYNTHROID) 200 mcg tablet Take 200 mcg by mouth daily 30 minutes before breakfast.    Provider, Historical   LORazepam (ATIVAN) 2 mg tablet Take one tablet by mouth every 6 hours as needed for Nausea, Vomiting or Other... (Max 4mg  every 6 hours as needed) for up to 7 doses. 09/14/19   Merilynn Finland, Brittani L, DO   losartan-hydrochlorothiazide (HYZAAR) 100-12.5 mg tablet Take 1 tablet by mouth every morning. Patient take 1/2 tablet daily.    Provider, Historical   polyethylene glycol 3350 (MIRALAX) 17 g packet Take one packet by mouth daily.  Patient taking differently: Take 17 g by mouth as Needed. 09/10/18   Arickx, Kandace Parkins, MD   temozolomide (TEMODAR) 20 mg capsule Take one capsule (20 mg) by mouth twice daily with 1 other temozolomide prescription for 25 mg per dose. 08/15/19   Bartholome Bill, MD   temozolomide [facility]) 5 mg capsule Take one capsule (5 mg) by mouth twice daily with 1 other temozolomide prescription for 25 mg per dose. 08/15/19   Bartholome Bill, MD   vitamins, multiple cap Take 1 capsule by mouth daily.    Provider, Historical       Review of Systems:  Review of Systems   Constitutional: Negative for chills and fever.   HENT: Negative for hearing loss.    Respiratory: Positive for cough.    Cardiovascular: Negative for chest pain.   Skin: Negative for itching and rash.   Neurological: Positive for speech change, seizures and headaches.   Psychiatric/Behavioral: Positive for depression. Negative for hallucinations. The patient is nervous/anxious and has insomnia.    All other systems reviewed and are negative.         PHYSICAL EXAMINATION:  Vitals: There were no vitals filed for this visit.   General:  No scleral icterus Moist Mucus Membranes  Aerating Well  No peripheral Edema  Skin Dry    Neurologic Exam (Telehealth):  Mental status:  ? Alert and oriented x 3  ? Recent and remote memory intact  ? Concentration and attention intact  ? Fund of knowledge intact; mood and affect normal  ? Language including naming, repetition, comprehension is normal and spontaneous speech is normal    Cranial nerves:  ? Ocular motility full  ? Face symmetric and strength normal  ? Hearing intact bilaterally to voice  ? Palate elevates symmetrically to phonation  ? Tongue protrudes to midline  ? Shoulder shrug is symmetric    Motor:  ? Muscle bulk is normal in all extremities    Coordination:  ? Rapid alternating movements normal    ===========================================================================    Impression:  Andrea [name] is a 48 y.o. with a relevant history of glioblastoma s/p resection who presents to my clinic today to establish care for refractory seizures. She has a singular seizure type consisting of disoriented aura followed by HA, impaired awareness, and automatisms (lip smacking). These events are currently suppressed with her new AEDs. 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    We discussed remaining on her current regimen of 750mg  Keppra BID and VPA 1000mg   TID. In addition, we discussed weaning off current wellbutrin as latest increase associated with breakthrough seizures. In addition, we discussed sleep hygiene and melatonin usage as lack of consistent sleep could be contributing to breakthrough events.     Other issues being addressed during this visit are:  side effects of the medications - Keppra (irritability) and Wellbutrin (lowering seizure threshold).    Plan/ Recommendations:  Diagnostic testing:   Defer EEG for now as clinically consistent with seizures and would not change medical management Recently with repeat MRI earlier this month at [facility]. Asked patient to push over to cloud for review.     Other testing or information requested:  - Blood work including:   -relevant anti-epileptic drug levels, drawn with regular blood draw in 2 weeks. Would like to check VPA levels to see room for further adjustment in the future.     Treatment:  Decrease Wellbutrin to 150mg  daily for week 1, then off for week 2.   Continue Keppra 750mg  BID and VPA 1000mg  BID  If breakthrough events, would increase Keppra to 1g BID  If tolerance becomes an issue (irritability), would opt for switch to Vimpat. Other considerations include topiramate and zonisamide.     Referrals:  None.    Follow-up:  - 1 month with me (Telehealth Visit)    Considerable time was spent on counseling the patient on the following, taking time to answer their quesions for which they verbalized understanding:  - The diagnosis was discussed including its probable causes, an explanation of the terminology, and the prognosis   - The plan for evaluation and the treatment  - Provoking factors for seizures, such as not taking prescribed antiepileptic medication, lack of sleep, fever, vomiting or diarrhea, metabolic abnormalities (electrolyte or blood sugar abnormalities), excessive alcohol, or illicit drugs.  - Seizure precautions & safety - they were advised not to swim or bathe alone and to avoid any situation in which a seizure could cause serious harm to self or others, including - but not limited to - working on heights or with heavy machinery and working over hot surfaces. Driving - The patient was advised not to drive after a seizure until on a stable regimen and at least 6 months seizure free per [place].      Time-based billing: Of the 60 minute encounter, > 50% of the time was spent on counseling as above.      Waldron Session, M.D.  [facility]  [facility]

## 2019-09-16 DIAGNOSIS — G40209 Localization-related (focal) (partial) symptomatic epilepsy and epileptic syndromes with complex partial seizures, not intractable, without status epilepticus: Principal | ICD-10-CM

## 2019-09-16 MED ORDER — DIVALPROEX 500 MG PO TBEC
1000 mg | ORAL_TABLET | Freq: Three times a day (TID) | ORAL | 2 refills | 30.00000 days | Status: DC
Start: 2019-09-16 — End: 2019-11-30

## 2019-09-17 ENCOUNTER — Encounter: Admit: 2019-09-17 | Discharge: 2019-09-17 | Payer: Commercial Managed Care - PPO

## 2019-09-17 MED ORDER — ZONISAMIDE 100 MG PO CAP
ORAL_CAPSULE | ORAL | 5 refills | 90.00000 days | Status: DC
Start: 2019-09-17 — End: 2019-11-16

## 2019-09-17 MED ORDER — CLORAZEPATE DIPOTASSIUM 3.75 MG PO TAB
3.75 mg | ORAL_TABLET | Freq: Two times a day (BID) | ORAL | 0 refills | 6.00000 days | Status: DC
Start: 2019-09-17 — End: 2019-11-16

## 2019-09-17 NOTE — Telephone Encounter
Pharmacy required authorization for tranxene due to taking other benzodiazepines as needed. Discussed with Tyrone Schimke to hold xanax and ativan during the 3 days the patient will be taking the tranxene and then after tranxene is finished to resume as needed. Chelsea Huneke expressed agreement and understanding.
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
## 2019-09-19 ENCOUNTER — Encounter: Admit: 2019-09-19 | Discharge: 2019-09-19 | Payer: Commercial Managed Care - PPO

## 2019-09-19 DIAGNOSIS — C719 Malignant neoplasm of brain, unspecified: Secondary | ICD-10-CM

## 2019-09-19 IMAGING — CR CHEST
1 series · 1 of 1 positions shown · non-contrast
Comparison: No relevant prior studies available.

DIAGNOSTIC STUDIES

EXAM:  XR CHEST, 1 VIEW  (90100)
INDICATION: Generalized weakness Pt c/o weakness. Prior CXR 08/30/19. CC ATH

[chest ap grid]
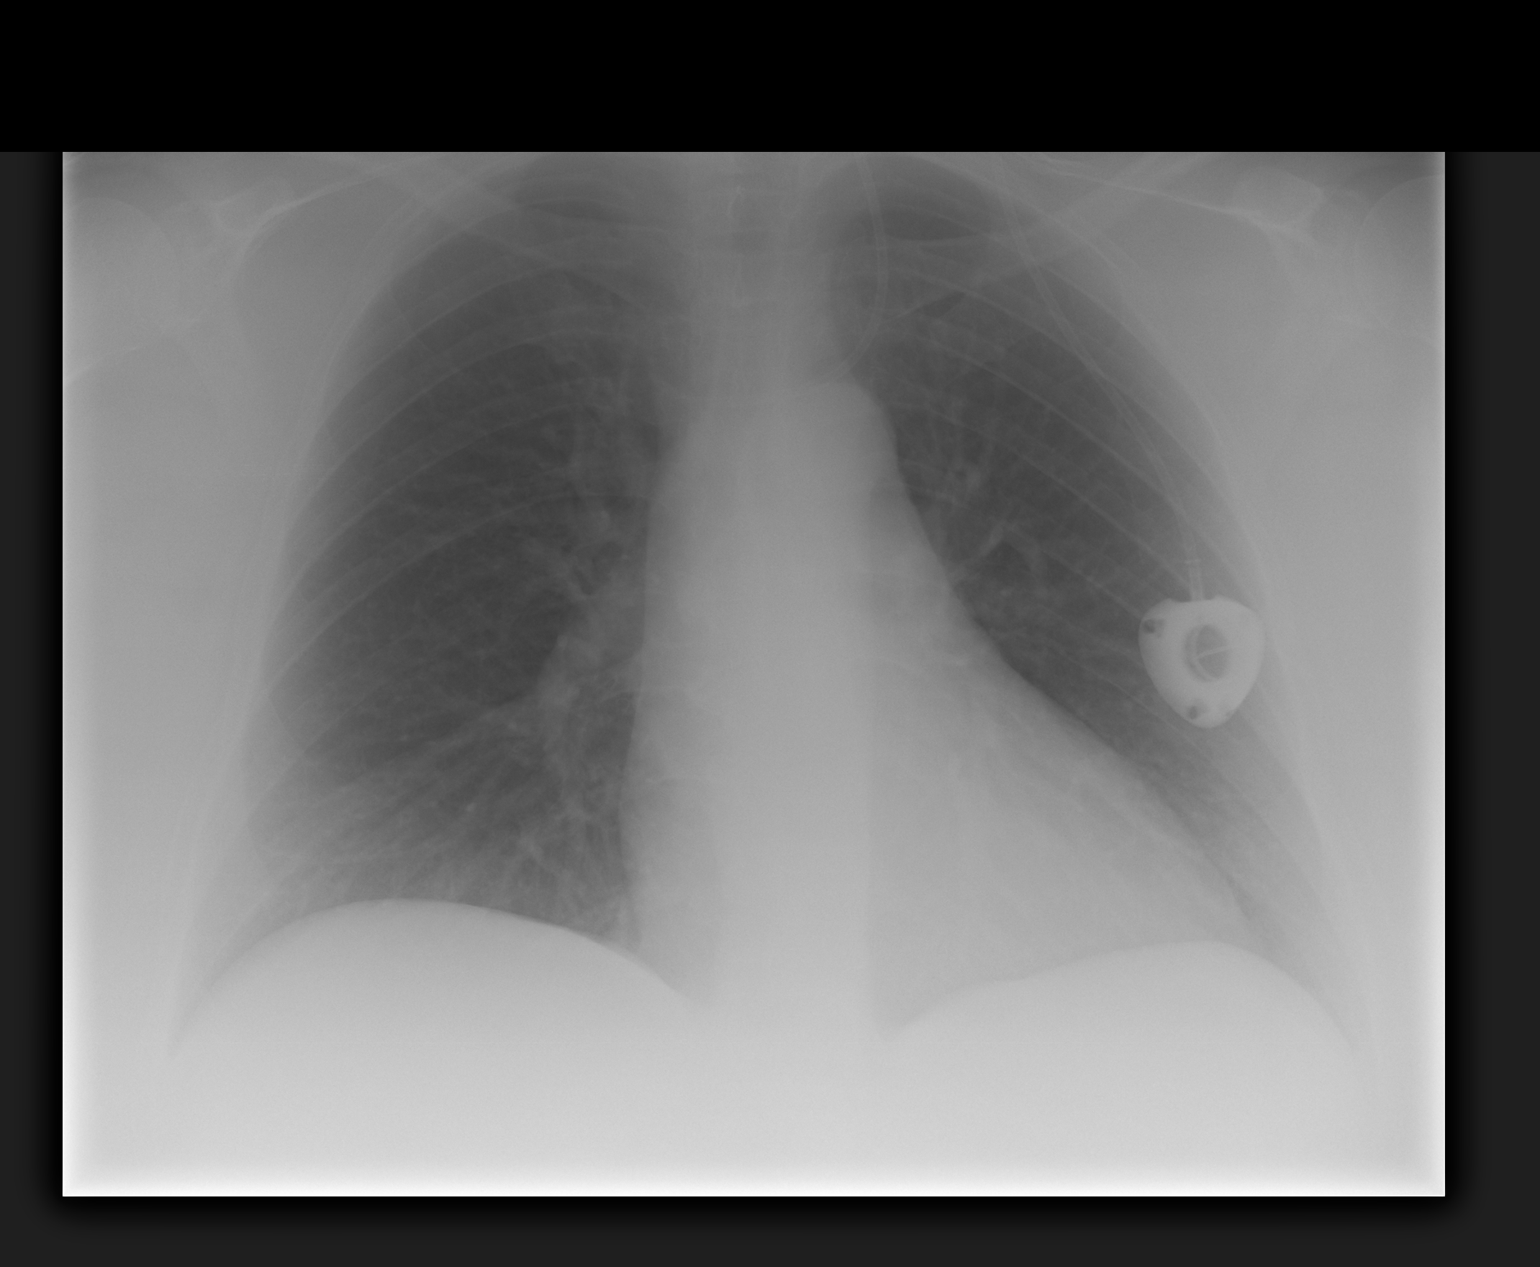

[1 of 1 positions shown; findings below may reference images not displayed]

FINDINGS: LUNGS:  No acute findings. No consolidation.

HEART:  No acute findings.  No cardiomegaly.

BONES/JOINTS:  NO definite fracture or dislocation.

TUBES, LINES AND DEVICES:  Grossly stable LEFT Port-a-Cath.
IMPRESSION: - No acute findings in the chest.

Tech Notes:

Pt c/o weakness. Prior CXR 08/30/19. CC

## 2019-09-19 IMAGING — CT BRAIN WO(Adult)
2 of 4 series · 12 of 47 positions shown, 15 images · non-contrast
Comparison: August 30, 2019.

DIAGNOSTIC STUDIES

EXAM:  CT HEAD WITHOUT INTRAVENOUS CONTRAST  (39749)
INDICATION: weakness Pt c/o weakness. H/o brain tumor. Prior CT 08/30/19. CC ATH
TECHNIQUE: Axial computed tomography images of the head/brain without intravenous contrast.

[Series 4: brain cor 5.00 hr40 s3 · coronal · 0.36mm/px · 3 of 45 slices shown]
[im 15/45  brain]
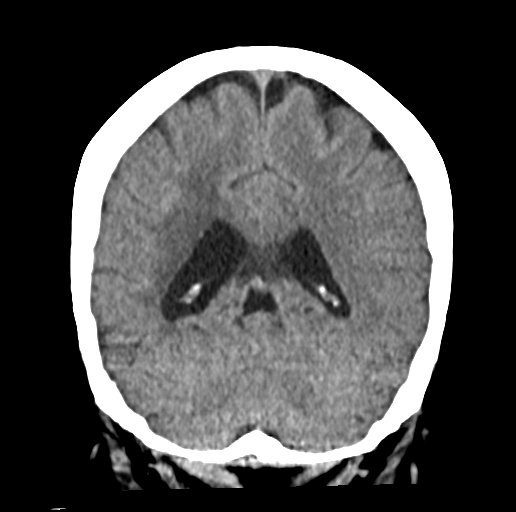
[im 20/45  brain]
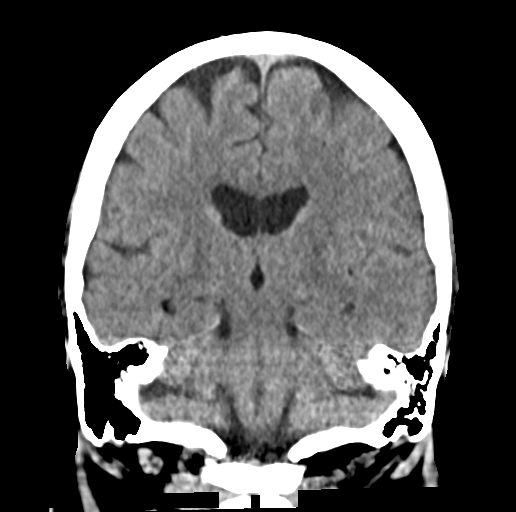
[im 25/45  brain]
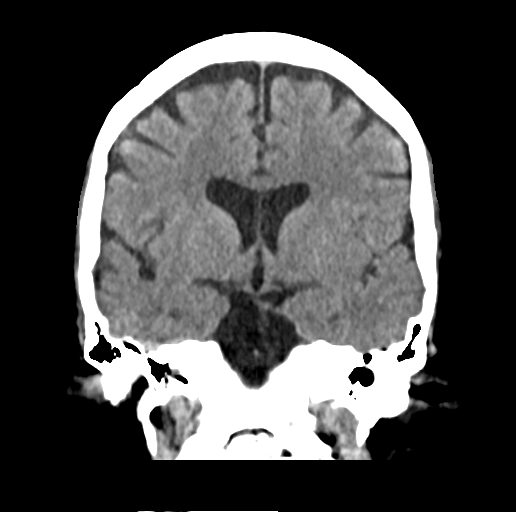

[Series 8: brain ax 2.00 hr60 s3 · axial · 0.36mm/px · z∈[-629,-480]mm · 9 of 92 slices shown, 12 images]
[im 9/92  brain]
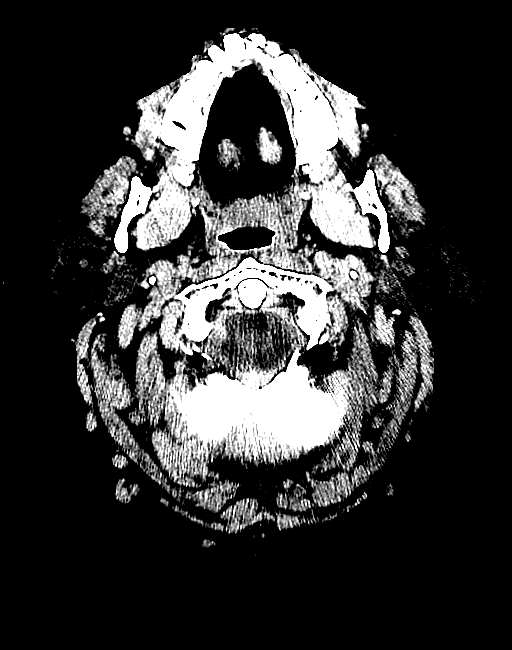
[im 9/92  bone]
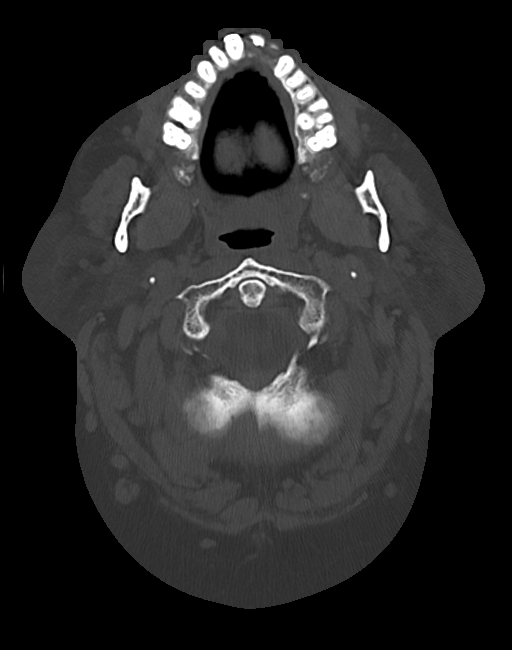
[im 18/92  brain]
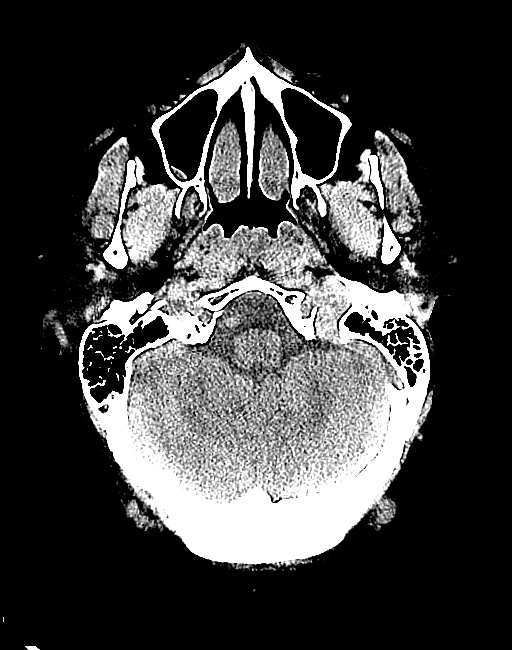
[im 27/92  brain]
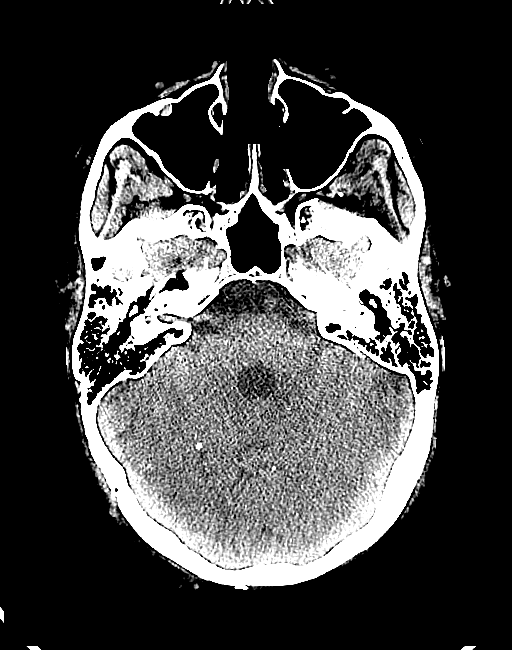
[im 35/92  brain]
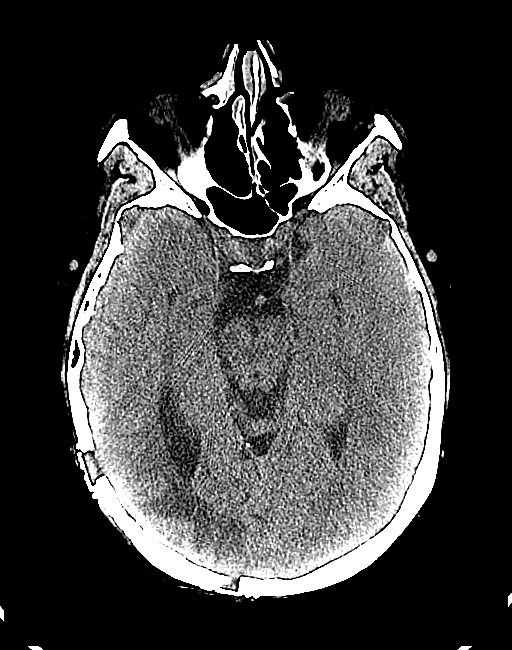
[im 48/92  brain]
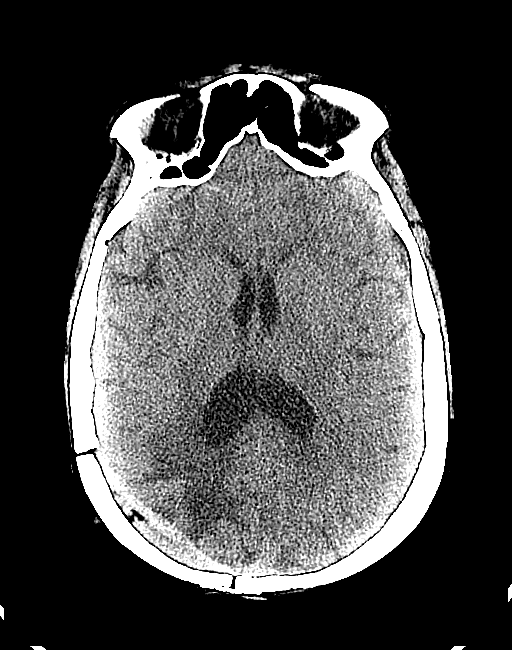
[im 48/92  bone]
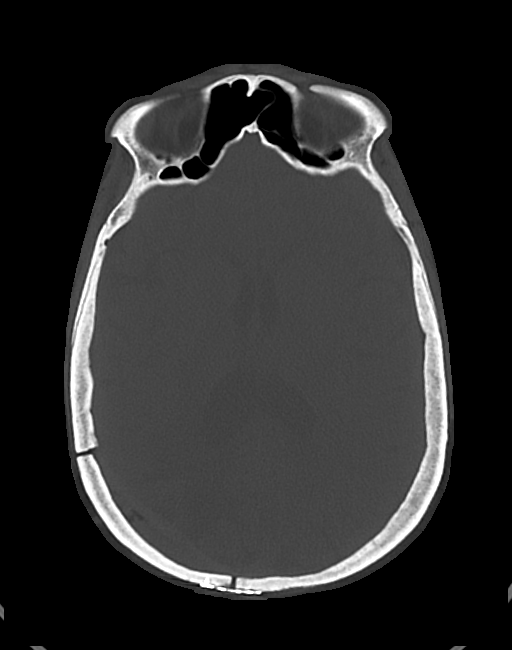
[im 57/92  brain]
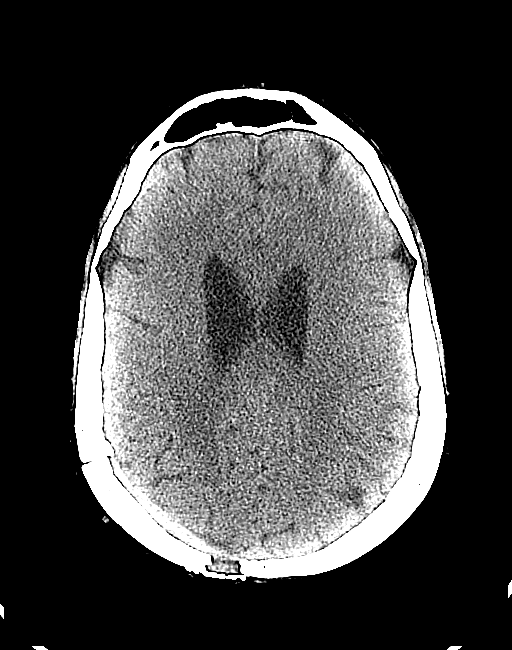
[im 66/92  brain]
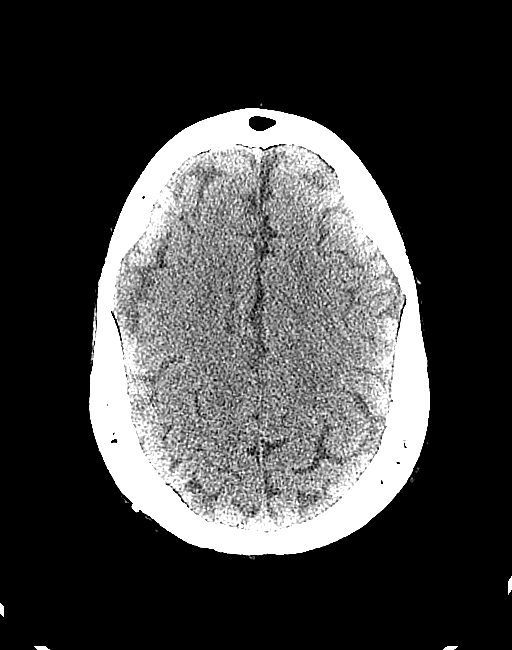
[im 74/92  brain]
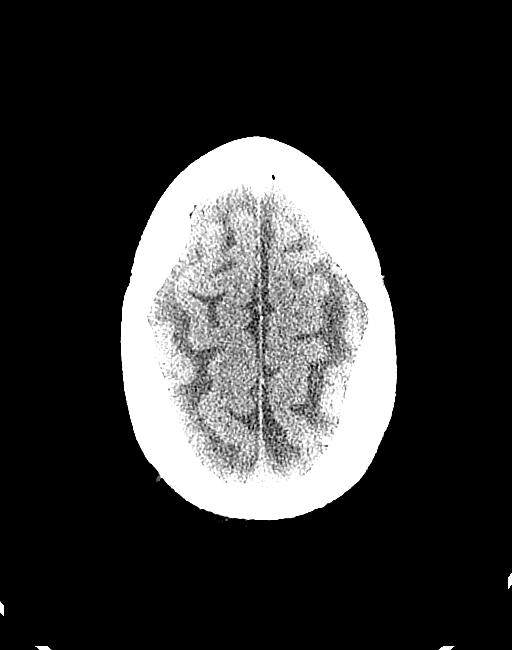
[im 83/92  brain]
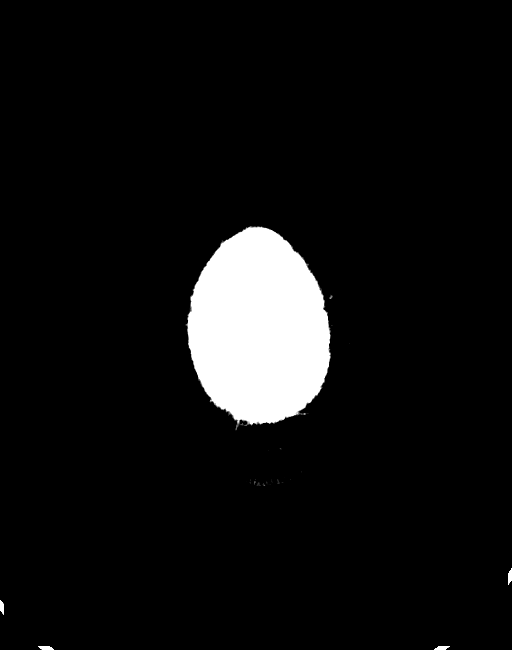
[im 83/92  bone]
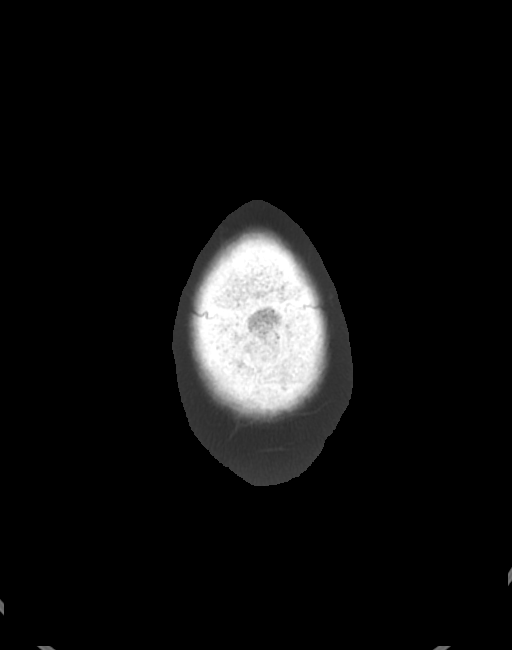

[12 of 47 positions shown; findings below may reference images not displayed]

All CT scans at this facility use dose modulation, interval reconstruction, and/or weight-based
dosing when appropriate to reduce radiation dose to as low as reasonably achievable.  Sagittal and
coronal reformatted images were created and reviewed.

Number of previous computed tomography exams in the last 12 months is 6.

Number of previous nuclear medicine myocardial perfusion studies in the last 12 months is 0.
FINDINGS: BRAIN:  Posterior fossa demonstrates NO evidence of cerebellar hemorrhage or mass.

Persistent extra-axial fluid with dural thickening in the RIGHT temporal parietal and parieto-
occipital areas. Encephalomalacia/parenchymal volume loss in the RIGHT parieto-occipital area,
unchanged from prior surgery.

NO evidence of acute hemorrhage.

VENTRICLES:  The cerebral ventricles are normal in size and position.

BONES/JOINTS:  Calvarium and skull base demonstrate NO acute changes.

Postsurgical changes in the skull posteriorly on the RIGHT. NO definite change from prior study.

SOFT TISSUES:  Soft tissues demonstrate NO evidence of radiopaque foreign bodies as visualized.

SINUSES:  Grossly stable paranasal sinuses.

MASTOID AIR CELLS:  The mastoid air cells are unopacified.
IMPRESSION: - NO evidence of acute hemorrhage.

- Further evaluat with MRI.on is recommended/indicated  with MRI.

Tech Notes:

Pt c/o weakness. H/o brain tumor. Prior CT 08/30/19. CC

## 2019-09-20 ENCOUNTER — Encounter: Admit: 2019-09-20 | Discharge: 2019-09-20 | Payer: Commercial Managed Care - PPO

## 2019-09-21 ENCOUNTER — Encounter: Admit: 2019-09-21 | Discharge: 2019-09-21 | Payer: Commercial Managed Care - PPO

## 2019-09-21 MED ORDER — CELECOXIB 200 MG PO CAP
ORAL_CAPSULE | Freq: Two times a day (BID) | 0 refills
Start: 2019-09-21 — End: ?

## 2019-09-24 ENCOUNTER — Encounter: Admit: 2019-09-24 | Discharge: 2019-09-24 | Payer: Commercial Managed Care - PPO

## 2019-09-26 MED ORDER — CELECOXIB 200 MG PO CAP
ORAL_CAPSULE | Freq: Two times a day (BID) | 0 refills
Start: 2019-09-26 — End: ?

## 2019-09-28 ENCOUNTER — Encounter: Admit: 2019-09-28 | Discharge: 2019-09-28 | Payer: Commercial Managed Care - PPO

## 2019-09-30 ENCOUNTER — Encounter: Admit: 2019-09-30 | Discharge: 2019-09-30 | Payer: Commercial Managed Care - PPO

## 2019-10-12 ENCOUNTER — Encounter: Admit: 2019-10-12 | Discharge: 2019-10-12 | Payer: Commercial Managed Care - PPO

## 2019-10-12 ENCOUNTER — Ambulatory Visit: Admit: 2019-10-12 | Discharge: 2019-10-13 | Payer: Commercial Managed Care - PPO

## 2019-10-12 DIAGNOSIS — C719 Malignant neoplasm of brain, unspecified: Secondary | ICD-10-CM

## 2019-10-17 IMAGING — MR Head^Brain
12 series · 44 of 48 positions shown · non-contrast
Comparison: none

[Series 2: T1 · sagittal · 5.0mm · 0.45mm/px · 3 of 18 slices shown (1 of 2)]
[im 1/18]
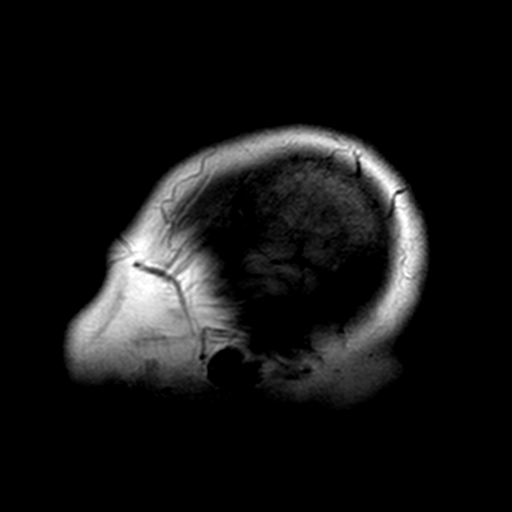
[im 9/18]
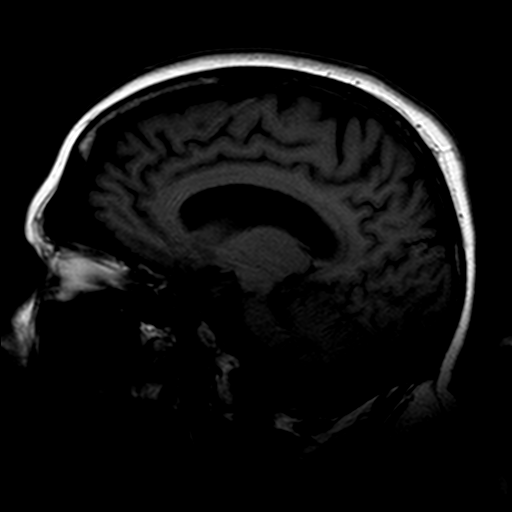
[im 18/18]
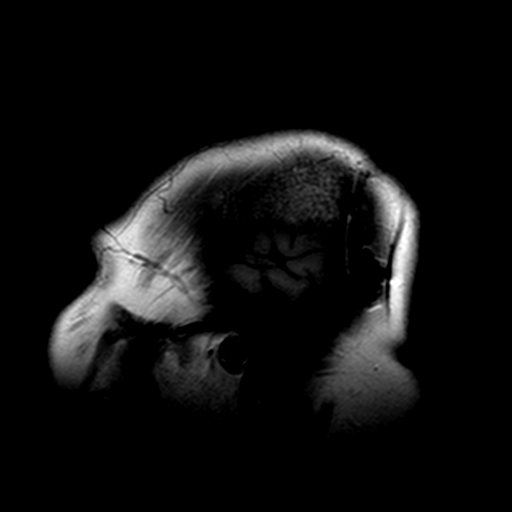

[Series 3: DWI · axial · 5.0mm · 1.80mm/px · z∈[-94,+30]mm · 8 of 60 slices shown (1 of 2)]
[im 1/60]
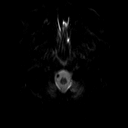
[im 7/60]
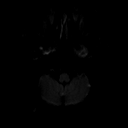
[im 20/60]
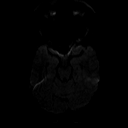
[im 27/60]
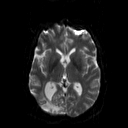
[im 33/60]
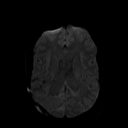
[im 40/60]
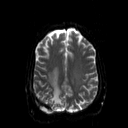
[im 53/60]
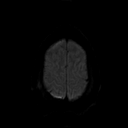
[im 60/60]
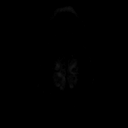

[Series 4: DWI · axial · 5.0mm · 1.80mm/px · z∈[-94,+30]mm · 3 of 20 slices shown (2 of 2)]
[im 1/20]
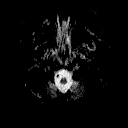
[im 10/20]
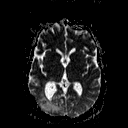
[im 20/20]
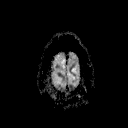

[Series 6: T2 · axial · 5.0mm · 0.72mm/px · z∈[-103,+33]mm · 4 of 23 slices shown (1 of 4)]
[im 1/23]
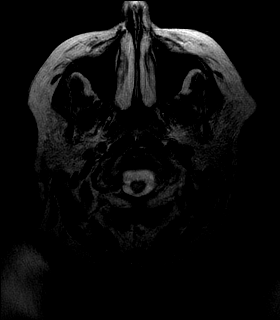
[im 8/23]
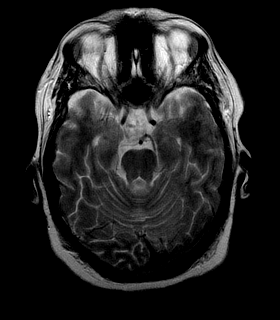
[im 15/23]
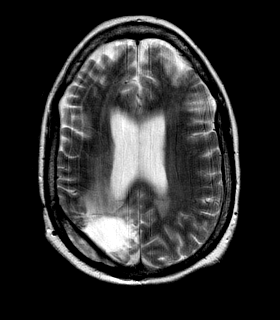
[im 23/23]
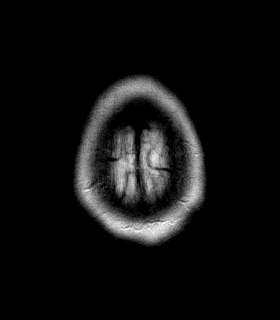

[Series 7: T1 · axial · 5.0mm · 0.45mm/px · z∈[-103,+39]mm · 4 of 24 slices shown (2 of 2)]
[im 1/24]
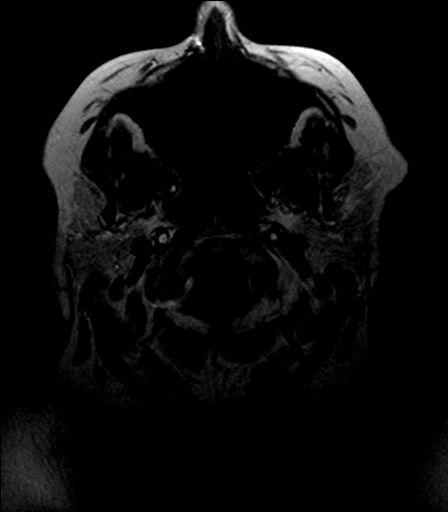
[im 8/24]
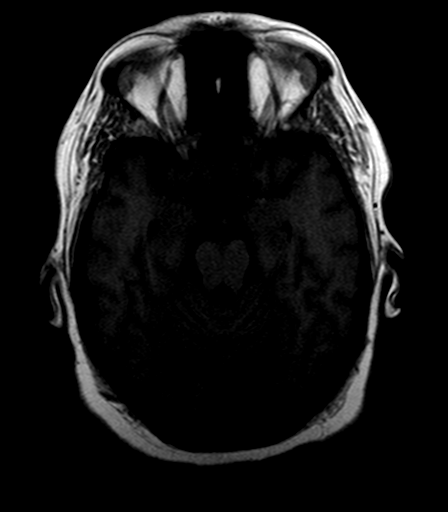
[im 16/24]
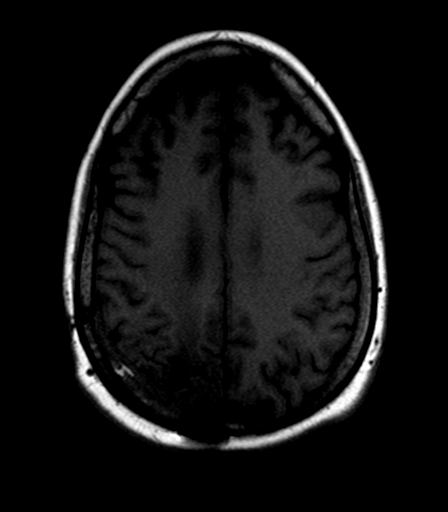
[im 24/24]
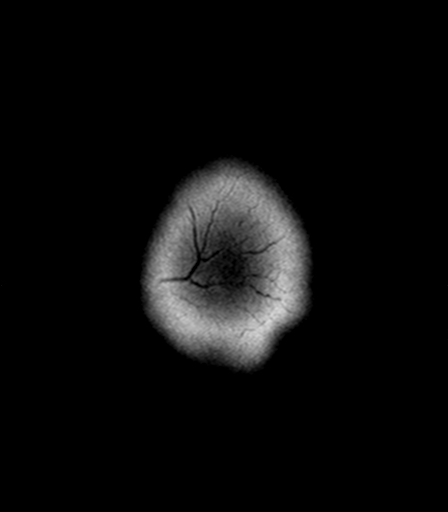

[Series 8: FLAIR · axial · 5.0mm · 0.45mm/px · z∈[-103,+39]mm · 4 of 24 slices shown]
[im 1/24]
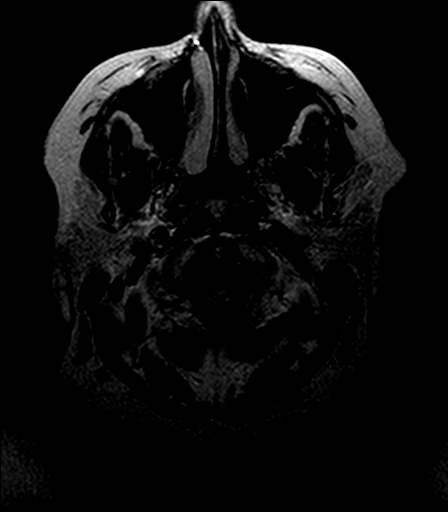
[im 8/24]
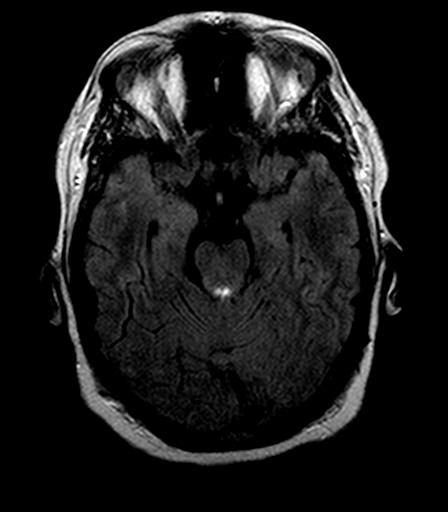
[im 16/24]
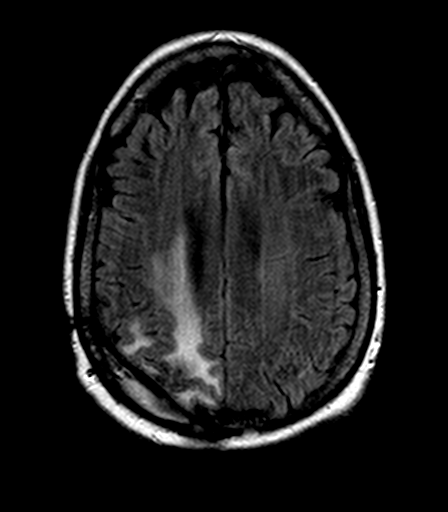
[im 24/24]
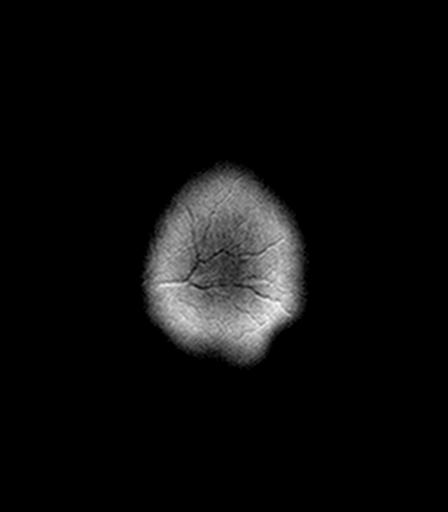

[Series 9: axial blood · axial · 5.0mm · 0.45mm/px · 1 of 20 slices shown]
[im 1/20]
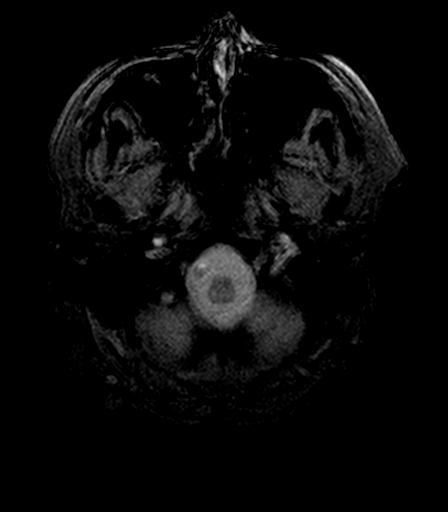

[Series 10: T2 · coronal · 5.0mm · 0.69mm/px · 4 of 24 slices shown (2 of 4)]
[im 1/24]
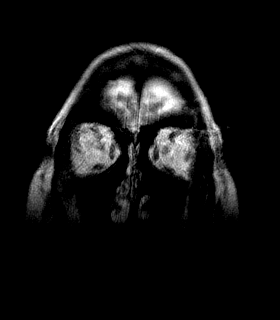
[im 8/24]
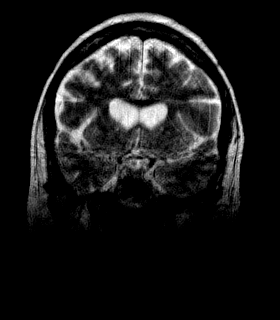
[im 16/24]
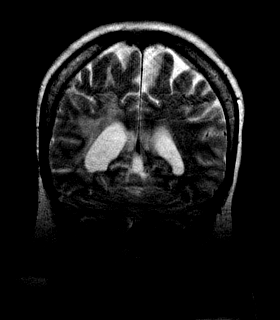
[im 24/24]
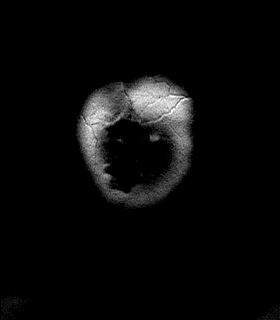

[Series 11: T2 · axial · 5.0mm · 0.72mm/px · z∈[-103,+39]mm · 4 of 22 slices shown (3 of 4)]
[im 1/22]
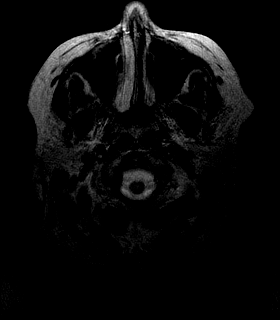
[im 8/22]
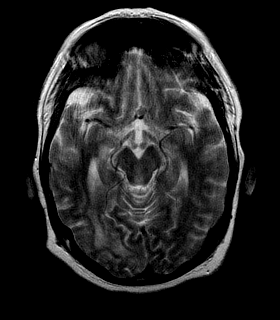
[im 15/22]
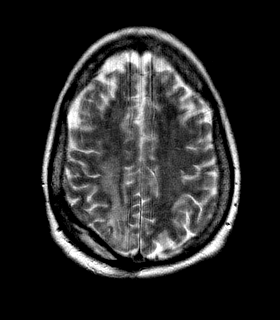
[im 22/22]
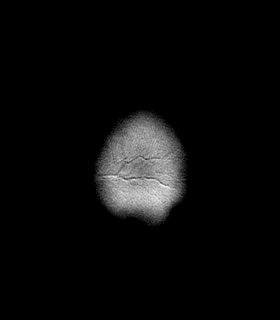

[Series 12: T2 · coronal · 5.0mm · 0.69mm/px · 4 of 23 slices shown (4 of 4)]
[im 1/23]
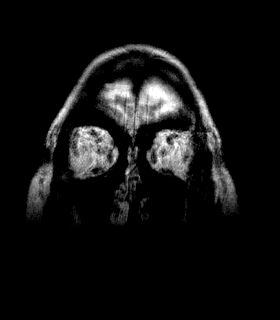
[im 8/23]
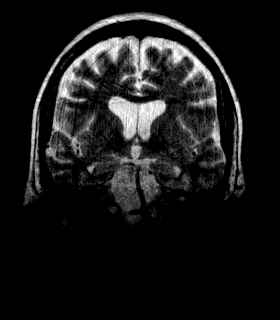
[im 15/23]
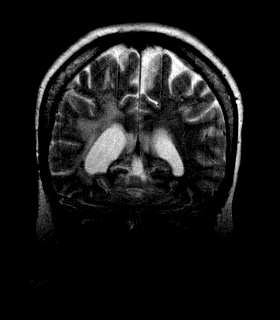
[im 23/23]
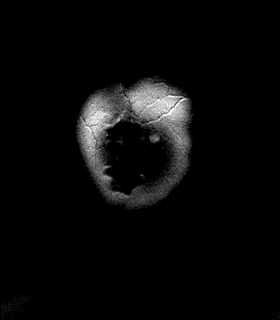

[Series 13: T1 fat-sat post-contrast · axial · 5.0mm · 0.90mm/px · z∈[-103,+39]mm · 3 of 21 slices shown (1 of 2)]
[im 1/21]
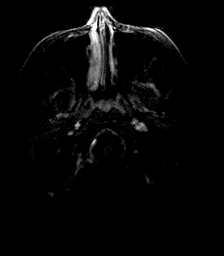
[im 11/21]
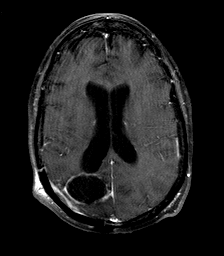
[im 21/21]
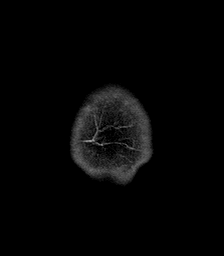

[Series 14: T1 fat-sat post-contrast · coronal · 5.0mm · 0.90mm/px · 2 of 13 slices shown (2 of 2)]
[im 1/13]
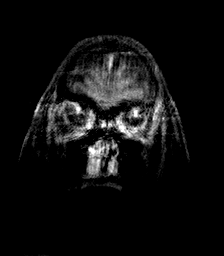
[im 13/13]
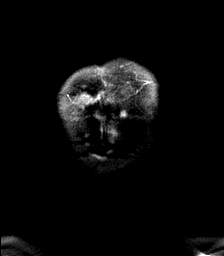

[44 of 48 positions shown; findings below may reference images not displayed]

DIAGNOSTIC STUDIES

EXAM

MRI of the brain with and without contrast.

INDICATION

GHloblastoma
HX GLIOBLASTOMA AND SX.  NEW ONSET SEIZURES AND TREMORS IN LAST 3-4 MONTHS, HAS REPEAT MRI EVERY 2
MONTHS.  PT WAS UNABLE TO TOLERATE LAYING ON TABLE
AFTER GADAVIST IS GIVEN DUE TO ANXIETY AND LOW BACK PAIN.  15 ML GADAVIST RG(..)

TECHNIQUE

Sagittal, axial, and coronal images were obtained with variable T1 and T2 weighting before and
after administration of 15 milliliters of Gadavist

COMPARISONS

Fourth [id number]

FINDINGS

Patient is status post right parietal occipital craniotomy and glioblastoma resection. The
resection cavity is stable in size measuring 3.8 x 2.8 cm image 13 series 13. Adjacent meningeal
enhancement is seen with small extra-axial fluid collection overlying the operative site likely
reflecting chronic subdural hematoma. This measures 0.9 cm in maximum thickness. Extensive T2 signal
can be identified throughout the posterior right parietal lobe and occipital lobe consistent with
post therapeutic change/gliosis.

No new mass lesions are seen. Ventricular system is unchanged. There is no evidence for acute
ischemia.

IMPRESSION

Post therapeutic changes of prior right parietal/occipital lobe mass resection. Operative cavity
is stable in size and appearance with minimal thin peripheral enhancement. Small stable extra-axial
fluid collection is seen overlying the operative site. No new mass lesions are evident.

Increased T2 signal throughout the posterior right cerebral hemisphere consistent with gliosis/post
therapeutic change.

Tech Notes:

HX GLIOBLASTOMA AND SX.  NEW ONSET SEIZURES AND TREMORS IN LAST 3-4 MONTHS, HAS REPEAT MRI EVERY 2
MONTHS.  PT WAS UNABLE TO TOLERATE LAYING ON TABLE AFTER GADAVIST IS GIVEN DUE TO ANXIETY AND LOW
BACK PAIN.  15 ML GADAVIST RG

## 2019-10-18 ENCOUNTER — Encounter: Admit: 2019-10-18 | Discharge: 2019-10-18 | Payer: Commercial Managed Care - PPO

## 2019-10-25 ENCOUNTER — Encounter: Admit: 2019-10-25 | Discharge: 2019-10-25 | Payer: Commercial Managed Care - PPO

## 2019-10-25 DIAGNOSIS — C719 Malignant neoplasm of brain, unspecified: Secondary | ICD-10-CM

## 2019-10-26 ENCOUNTER — Ambulatory Visit: Admit: 2019-10-26 | Discharge: 2019-10-27 | Payer: Commercial Managed Care - PPO

## 2019-10-27 ENCOUNTER — Encounter: Admit: 2019-10-27 | Discharge: 2019-10-27 | Payer: Commercial Managed Care - PPO

## 2019-10-27 DIAGNOSIS — C719 Malignant neoplasm of brain, unspecified: Secondary | ICD-10-CM

## 2019-10-27 NOTE — Telephone Encounter
I called Andrea [name] to notify her that her labs from 10/25/2019 from [facility] showed a PLT level of 46. I told her to continue to hold the Temodar and get labs drawn on 1/4 at [facility].

## 2019-11-01 ENCOUNTER — Encounter: Admit: 2019-11-01 | Discharge: 2019-11-01 | Payer: Commercial Managed Care - PPO

## 2019-11-01 DIAGNOSIS — C719 Malignant neoplasm of brain, unspecified: Secondary | ICD-10-CM

## 2019-11-01 LAB — CBC AND DIFF

## 2019-11-01 LAB — COMPREHENSIVE METABOLIC PANEL

## 2019-11-03 ENCOUNTER — Ambulatory Visit: Admit: 2019-11-03 | Discharge: 2019-11-03 | Payer: Commercial Managed Care - PPO

## 2019-11-03 ENCOUNTER — Encounter: Admit: 2019-11-03 | Discharge: 2019-11-03 | Payer: Commercial Managed Care - PPO

## 2019-11-03 DIAGNOSIS — M3501 Sicca syndrome with keratoconjunctivitis: Secondary | ICD-10-CM

## 2019-11-03 DIAGNOSIS — H53462 Homonymous bilateral field defects, left side: Secondary | ICD-10-CM

## 2019-11-03 DIAGNOSIS — E079 Disorder of thyroid, unspecified: Secondary | ICD-10-CM

## 2019-11-03 DIAGNOSIS — F329 Major depressive disorder, single episode, unspecified: Secondary | ICD-10-CM

## 2019-11-03 DIAGNOSIS — F909 Attention-deficit hyperactivity disorder, unspecified type: Secondary | ICD-10-CM

## 2019-11-03 DIAGNOSIS — I10 Essential (primary) hypertension: Secondary | ICD-10-CM

## 2019-11-03 NOTE — Progress Notes
Assessment and Plan:    Problem   Homonymous Hemianopia, Left    Left inferior quadrant OU         Keratoconjunctivitis sicca (HCC)  - Patient intolerant of restasis and would like to trial off for now. Still with low tear film exam but otherwise healthy appearing ocular surface.    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 We agree a referral is not warranted at this time.    - Sent with new glasses prescription      Stefan Church, MD, PGY-3      I personally performed the key portions of the E/M visit, discussed case with the Resident and concur with the resident documentation of history, physical exam, assessment, and treatment plan unless otherwise noted below.    Madelaine Etienne, MD  [facility]      Return in about 1 year (around 11/02/2020) for Dilated exam, Ophth Visual Field Return, MRx.    Corky Sox, MD  [facility]      HPI:  Patient presents with:  Eye Problem: Pt here for her annual eye exam. She has a history of homo hemi OS. She says that she feels like her vision is worse. She started having petit malle seizures in November. She has had 3-4 that were witnessed but isn't sure about non-witnessed. She thinks her vision got worse before the seizures started. She is having a hard time keeping time straight.   Treatment: Restasis every 2-3x daily due to burning with use. AT's 1-4x daily OU  FYI: She has been having a metallic taste in her mouth and has had no appetite.  Was also in the hospital in November for dehydration. Looking to get back on chemo once her platelets come back up Notes some mild decrease in vision OU. States she thinks her blind spot has gotten a little worse. Outside of burning from restasis, states eyes otherwise feel okay with no gritty or dry sensation.     Exam:  Base Eye Exam     Visual Acuity (Snellen - Linear)       Right Left    Dist cc 20/30 +2 20/25    Correction: Glasses          Tonometry (iCare Tonometer, 3:31 PM)       Right Left    Pressure 15 14          Pupils       Dark Shape React APD    Right 5.5 Round Brisk None    Left 4 Round Sluggish None   MD           Visual Fields    HVF           Extraocular Movement       Right Left     Full Full          Neuro/Psych     Oriented x3: Yes    Mood/Affect: Normal          Dilation     Both eyes: 1.0% Tropicamide, 2.5% Phenylephrine @ 3:45 PM            Slit Lamp and Fundus Exam  External Exam       Right Left    External Normal Normal          Slit Lamp Exam       Right Left    Lids/Lashes Normal Normal    Conjunctiva/Sclera White and quiet White and quiet    Cornea low-mod TF low-mod TF    Anterior Chamber Deep and quiet Deep and quiet    Iris Flat Flat    Lens Clear Clear          Fundus Exam       Right Left    Vitreous Normal Normal    Disc Sharp, healthy rim Sharp, healthy rim    C/D Ratio 0.3 0.3    Macula Flat Flat    Vessels Normal caliber and number Normal caliber and number    Periphery Attached, no breaks or tears Attached, no breaks or tears            Refraction     Wearing Rx       Sphere Cylinder Axis Add    Right +0.75   +2.25    Left +1.25 +0.75 090 +2.25          Manifest Refraction       Sphere Cylinder Axis Dist VA Add    Right +1.75   20/20-2 +2.25    Left +1.75 +0.75 090 20/20-2 +2.25          Final Rx       Sphere Cylinder Axis Dist VA Add    Right +1.75   20/20-2 +2.25    Left +1.75 +0.75 090 20/20-2 +2.25    Expiration Date: 11/03/2020                VISUAL FIELD, EXTEND     Visual Field Examination Type  Humphrey Automated  Laterality   Both eyes  Right Eye  Threshold: 24-2 Strategy:  SITA StandardStimulus Size:  IIIStimulus Color:  WhiteReliability:  Fixation lossesResults:  StableLocation:  LeftDescription:  Homonymous hemianopia  Left Eye  Threshold: 24-2  Strategy:  SITA StandardStimulus Size:  IIIStimulus Color:  WhiteReliability:  GoodResults:  StableLocation:  LeftDescription:  Homonymous hemianopia  OD FL: 8/16. VFI 73% MD -12.71  OS: FN 20%,  VFI 70% MD -10.45  Stable left homonymous hemianopia

## 2019-11-03 NOTE — Assessment & Plan Note
-   Patient intolerant of restasis and would like to trial off for now. Still with low tear film exam but otherwise healthy appearing ocular surface.    P - Continue AT QID PRN

## 2019-11-04 ENCOUNTER — Encounter: Admit: 2019-11-04 | Discharge: 2019-11-04 | Payer: Commercial Managed Care - PPO

## 2019-11-04 DIAGNOSIS — C719 Malignant neoplasm of brain, unspecified: Secondary | ICD-10-CM

## 2019-11-04 LAB — CBC AND DIFF

## 2019-11-04 NOTE — Telephone Encounter
I called Andrea [name] to tell her that we received her labs and they were PLTs 89 and ANC 2.6. I told her they are going up but we are still too low to start the Temodar back up. I told her to continue to hold her meds and get labs rechecked on Monday, 1/11.

## 2019-11-07 ENCOUNTER — Encounter: Admit: 2019-11-07 | Discharge: 2019-11-07 | Payer: Commercial Managed Care - PPO

## 2019-11-07 DIAGNOSIS — C719 Malignant neoplasm of brain, unspecified: Secondary | ICD-10-CM

## 2019-11-07 NOTE — Oral Chemotherapy Note
Name:Andrea [name]           MRN: [id number]                 DOB:1971/07/26          Age: 49 y.o.  Date of Service: 11/07/2019      Oral Chemotherapy Refill Review     Patient Information:     Correct patient/DOB/Diagnosis: Yes    Reviewed most recent clinic notes for changes in plan: Yes    Most recent labs reviewed: Yes    Appointment Information:   Date of last appointment: 10/26/2019    Follow up appointment scheduled : No Visit date not found      Medication Information:     Medication name: Temodar    Medication in [place]: Yes     Date of last refill released from [facility]: 08/15/2019    Weight change >10%: No    Has a dose adjustment been made since last refill?No    Is Pharmacy listed in [place] treatment plan? Yes    [facility]: [facility] [place]     Consent Date 12/15/2018    Patient Contact  Method of communication: telephone  Confirmed need for refill: Yes  Patient taking medication as directed Yes  Confirmation of receiving pharmacy: Yes  Patient is anticipating a start date of: ongoing    Other Information: PLT 120 and ANC 2.4    Plan was routed to Dr. Magnus Ivan

## 2019-11-07 NOTE — Telephone Encounter
I called Andrea [name] to let her know that her PLTs are up to 120 and ANC is 2.4. I told her she can go ahead and start her Temodar BID again along with the celebrex. She said she needed a refill for the Temodar because she is totally out. I went ahead and sent for a refill. Patient has no further questions at this time.

## 2019-11-07 NOTE — Progress Notes
AUTH NEEDED FOR EXTERNAL IMAGING    Patient info:  Name:  Andrea [name]  MRN:      [id number]  DOB: 08-Jul-1971        [facility] to [facility]    Orders Placed:  yes  Diagnosis ICD10 Code:  C71.9    Ordering Provider:  Dr. Magnus Ivan    Scan needed by date:  12/21/2019

## 2019-11-08 ENCOUNTER — Encounter: Admit: 2019-11-08 | Discharge: 2019-11-08 | Payer: Commercial Managed Care - PPO

## 2019-11-10 ENCOUNTER — Encounter: Admit: 2019-11-10 | Discharge: 2019-11-10 | Payer: Commercial Managed Care - PPO

## 2019-11-10 DIAGNOSIS — C719 Malignant neoplasm of brain, unspecified: Secondary | ICD-10-CM

## 2019-11-10 MED ORDER — IMS MIXTURE TEMPLATE
10 mg/m2 | Freq: Two times a day (BID) | ORAL | 0 refills | Status: CN
Start: 2019-11-10 — End: ?

## 2019-11-10 MED ORDER — CELECOXIB 200 MG PO CAP
200 mg | ORAL_CAPSULE | Freq: Two times a day (BID) | ORAL | 3 refills | Status: AC
Start: 2019-11-10 — End: ?

## 2019-11-10 MED ORDER — TEMOZOLOMIDE 20 MG PO CAP
20 mg | ORAL_CAPSULE | Freq: Two times a day (BID) | ORAL | 0 refills | Status: DC
Start: 2019-11-10 — End: 2019-12-19

## 2019-11-10 MED ORDER — TEMOZOLOMIDE 5 MG PO CAP
5 mg | ORAL_CAPSULE | Freq: Two times a day (BID) | ORAL | 0 refills | Status: DC
Start: 2019-11-10 — End: 2019-12-19

## 2019-11-10 NOTE — Telephone Encounter
Called patient to schedule a follow up appointment and to confirm what dose she's currently taking for her zonisamide.    Patient is currently taking zonisimide 200mg  at night. Got patient scheduled for a follow up appointment on 11/16/19.

## 2019-11-10 NOTE — Telephone Encounter
Andrea [name] called to tell us she has not received her Temodar yet. I sent Dr. Murvin Natal a message and it was sent to her pharmacy on 11/10/2019. I called to inform her of this and she let me know she is also out of her Celebrex pills. I told her we would make sure she had them so she could restart her medications. She had no further questions at this time.

## 2019-11-11 ENCOUNTER — Encounter: Admit: 2019-11-11 | Discharge: 2019-11-11 | Payer: Commercial Managed Care - PPO

## 2019-11-15 ENCOUNTER — Encounter: Admit: 2019-11-15 | Discharge: 2019-11-15 | Payer: Commercial Managed Care - PPO

## 2019-11-16 ENCOUNTER — Encounter

## 2019-11-16 DIAGNOSIS — I10 Essential (primary) hypertension: Secondary | ICD-10-CM

## 2019-11-16 DIAGNOSIS — E079 Disorder of thyroid, unspecified: Secondary | ICD-10-CM

## 2019-11-16 DIAGNOSIS — F909 Attention-deficit hyperactivity disorder, unspecified type: Secondary | ICD-10-CM

## 2019-11-16 DIAGNOSIS — F329 Major depressive disorder, single episode, unspecified: Secondary | ICD-10-CM

## 2019-11-16 MED ORDER — ZONISAMIDE 100 MG PO CAP
ORAL_CAPSULE | ORAL | 5 refills | 90.00000 days | Status: DC
Start: 2019-11-16 — End: 2020-03-16

## 2019-11-16 NOTE — Patient Instructions
Seizure precautions: No driving for 6 months since last seizure this is [place] state law. No climbing ladders, operating heavy machinery, cooking over a grill or stove, swimming or tub baths alone, or any activity during which acute and unexpected loss of consciousness could lead to injury to self or others.    Medications:   Please increase your zonisamide from 200mg  to 300mg .   Please stop your Keppra and Depakote.     Return to Clinic:  1 month with me

## 2019-11-17 ENCOUNTER — Encounter

## 2019-11-17 DIAGNOSIS — G40209 Localization-related (focal) (partial) symptomatic epilepsy and epileptic syndromes with complex partial seizures, not intractable, without status epilepticus: Secondary | ICD-10-CM

## 2019-11-21 ENCOUNTER — Encounter: Admit: 2019-11-21 | Discharge: 2019-11-21 | Payer: Commercial Managed Care - PPO

## 2019-11-21 NOTE — Telephone Encounter
Called pt to let her know we got her labs and PLT 156 and ANC 1.9. Okay to resume Temodar. Told her to my chart [place] when she is getting low on prescription. This information was relayed to the patient. No other questions or concerns.

## 2019-11-22 ENCOUNTER — Encounter: Admit: 2019-11-22 | Discharge: 2019-11-22 | Payer: Commercial Managed Care - PPO

## 2019-11-22 DIAGNOSIS — C719 Malignant neoplasm of brain, unspecified: Secondary | ICD-10-CM

## 2019-11-28 ENCOUNTER — Encounter: Admit: 2019-11-28 | Discharge: 2019-11-28 | Payer: Commercial Managed Care - PPO

## 2019-11-28 NOTE — Telephone Encounter
Attempted to call Cornelia Copa to perform reassessment for oral chemotherapy medication.  No answer. Left voicemail asking patient to return call to the pharmacist at [phone].     Ainsley Spinner, [facility]  Oncology Clinical Pharmacist  11/28/2019

## 2019-11-30 ENCOUNTER — Encounter: Admit: 2019-11-30 | Discharge: 2019-11-30 | Payer: Commercial Managed Care - PPO

## 2019-11-30 NOTE — Telephone Encounter
Oral Chemotherapy Reassessment Note    Appropriateness of Therapy    KANITA GUTEKUNST continues on Temozolomide (Temodar) for the treatment of Glioblastoma.  Cycle 1 start date was 02/04/19; Shandria recently held tx d/t thrombocytopenia, but resumed about 1-2 weeks ago. The regimen of Temozolomide (Temodar) 25 mg PO BID - is appropriate to continue at this time.    Alexix reports that she is no longer taking Depakote.  She did resume Celebrex when she restarted the Temodar.  No c/o AE's or missed doses.  No new medications started recently.      Eliqiuis and Zonisamide continue.    No renal or hepatic dose adjustment is required at this time.  No dose adjustments based on toxicity are required at this time.     Treatment will continue until progression or unacceptable toxicity.        CBC w diff    Lab Results   Component Value Date/Time    WBC 7.9 09/13/2019 10:40 PM    RBC 4.09 09/13/2019 10:40 PM    HGB 12.7 09/13/2019 10:40 PM    HCT 37.1 09/13/2019 10:40 PM    MCV 90.8 09/13/2019 10:40 PM    MCH 31.1 09/13/2019 10:40 PM    MCHC 34.3 09/13/2019 10:40 PM    RDW 14.8 09/13/2019 10:40 PM    PLTCT 244 09/13/2019 10:40 PM    MPV 7.6 09/13/2019 10:40 PM    Lab Results   Component Value Date/Time    NEUT 66 09/13/2019 10:40 PM    ANC 5.25 09/13/2019 10:40 PM    LYMA 24 09/13/2019 10:40 PM    ALC 1.90 09/13/2019 10:40 PM    MONA 7 09/13/2019 10:40 PM    AMC 0.54 09/13/2019 10:40 PM    EOSA 2 09/13/2019 10:40 PM    AEC 0.13 09/13/2019 10:40 PM    BASA 1 09/13/2019 10:40 PM    ABC 0.05 09/13/2019 10:40 PM        Comprehensive Metabolic Profile    Lab Results   Component Value Date/Time    NA 142 09/13/2019 10:40 PM    K 3.9 09/13/2019 10:40 PM    CL 107 09/13/2019 10:40 PM    CO2 26 09/13/2019 10:40 PM    GAP 9 09/13/2019 10:40 PM    BUN 13 09/13/2019 10:40 PM    CR 0.86 09/13/2019 10:40 PM    GLU 90 09/13/2019 10:40 PM    Lab Results   Component Value Date/Time    CA 9.2 09/13/2019 10:40 PM PO4 3.0 08/26/2018 02:44 PM    ALBUMIN 3.9 09/13/2019 10:40 PM    TOTPROT 7.1 09/13/2019 10:40 PM    ALKPHOS 60 09/13/2019 10:40 PM    AST 16 09/13/2019 10:40 PM    ALT 11 09/13/2019 10:40 PM    TOTBILI 0.3 09/13/2019 10:40 PM    GFR >60 09/13/2019 10:40 PM    GFRAA >60 09/13/2019 10:40 PM              Creatinine clearance cannot be calculated (Patient's most recent lab result is older than the maximum 30 days allowed.)      Response to Therapy    The electronic medical record for [name] has been reviewed. No evidence of progression that would necessitate a change of therapy has been identified. Patient will continue therapy as she is achieving therapeutic benefit.     Adverse Effects Assessment    Ms. Colt is not experiencing any significant adverse effects  to this medication regimen.     Adherence Assessment    The patient's ability to self-administer medication was assessed.     Ms. Hutzler reports missing 0 doses over the past 30 day(s) not related to toxicity. Ms. Sampedro was re-educated on importance of adherence.     Medication Reconciliation    A medication history and reconciliation was performed (including prescription medications, supplements, over the counter medications, and herbal products). The medication list was updated and the patient?s current medication list is included below.     Prior to Admission medications    Medication Sig Start Date End Date Taking? Authorizing Provider   acetaminophen (TYLENOL) 325 mg tablet Take two tablets by mouth every 6 hours as needed for Pain. 09/09/18   Arickx, Kandace Parkins, MD   ALPRAZolam Prudy Feeler) 1 mg tablet Take 1 mg by mouth at bedtime as needed for Anxiety.    HISTORICAL PROVIDER   apixaban (ELIQUIS) 5 mg tablet Take one tablet by mouth twice daily. Indications: a clot in the lung 12/23/18   Omer Jack, MD   buspirone HCl (BUSPAR PO) Take 7.5 mg by mouth twice daily.    HISTORICAL PROVIDER calcium carbonate (TUMS) 500 mg (200 mg elemental calcium) chewable tablet Chew 500 mg by mouth as Needed.    HISTORICAL PROVIDER   celecoxib (CELEBREX) 200 mg capsule Take one capsule by mouth twice daily. 11/10/19   Claudie Fisherman, MD   cyclosporine (RESTASIS) 0.05 % ophthalmic emulsion Apply one drop to both eyes twice daily. Fill largest quantity covered  Indications: keratoconjunctivitis sicca, or dry and inflamed cornea and conjunctiva of the eye 11/05/18   Corky Sox, MD   docusate (COLACE) 100 mg capsule Take 100-200 mg by mouth twice daily as needed for Constipation.    Provider, Historical   duloxetine DR (CYMBALTA) 60 mg capsule Take one capsule by mouth daily. 09/09/18   Arickx, Kandace Parkins, MD   furosemide (LASIX) 40 mg tablet Take 40 mg by mouth daily as needed (leg swelling).    Provider, Historical   gabapentin (NEURONTIN) 300 mg capsule Take 300 mg by mouth every 8 hours.    HISTORICAL PROVIDER   granisetron (KYTRIL) 1 mg tablet Take one tablet by mouth every 12 hours as needed for Nausea. 12/29/18   Bartholome Bill, MD   HYDROcodone/acetaminophen (NORCO) 5/325 mg tablet Take 1 tablet by mouth every 6 hours as needed for Pain    HISTORICAL PROVIDER   levothyroxine (SYNTHROID) 150 mcg tablet Take 150 mcg by mouth daily 30 minutes before breakfast.    HISTORICAL PROVIDER   LORazepam (ATIVAN) 2 mg tablet Take one tablet by mouth every 6 hours as needed for Nausea, Vomiting or Other... (Max 4mg  every 6 hours as needed) for up to 7 doses. 09/14/19   Frann Rider L, DO   losartan-hydrochlorothiazide (HYZAAR) 100-12.5 mg tablet Take 1 tablet by mouth every morning. Patient take 1/2 tablet daily.    Provider, Historical   pe/hydrocod/acetaminophen/cpm (HYDROCOD-CPM-PE-ACETAMINOPHEN PO) Take 1-2 tablets by mouth every 4-6 hours as needed. 08/13/19   HISTORICAL PROVIDER   polyethylene glycol 3350 (MIRALAX) 17 g packet Take one packet by mouth daily. Patient taking differently: Take 17 g by mouth as Needed. 09/10/18   Arickx, Kandace Parkins, MD   temozolomide (TEMODAR) 20 mg capsule Take one capsule (20 mg) by mouth twice daily with 1 other temozolomide prescription for 25 mg per dose. 11/10/19   Claudie Fisherman, MD   temozolomide [facility])  5 mg capsule Take one capsule (5 mg) by mouth twice daily with 1 other temozolomide prescription for 25 mg per dose. 11/10/19   Claudie Fisherman, MD   vitamins, multiple cap Take 1 capsule by mouth daily.    Provider, Historical   zonisamide (ZONEGRAN) 100 mg capsule Take 300mg  at night 11/16/19   Scarlette Shorts, MD       Drug-drug and drug-food interactions between the patients? specialty medication and their medication list were assessed and reviewed with the patient.     No significant drug-drug interactions were identified.     Ms. Ravenscroft was instructed to speak with her health care provider and/or the oral chemotherapy pharmacist before starting any new drug, including prescription or over the counter, natural / herbal products, or vitamins.      Allergies   Allergen Reactions   ? Morphine ANAPHYLAXIS   ? Codeine RASH   ? Erythromycin SHORTNESS OF BREATH   ? Latex RASH   ? Keppra [Levetiracetam] AGITATION   ? Nsaids (Non-Steroidal Anti-Inflammatory Drug) SEE COMMENTS     History of GI bleed - has been instructed to avoid NSAID's       Vaccination Status Assessment   Immunization History   Administered Date(s) Administered   ? DTaP vaccine IM (Infanrix) 07/31/1970, 02/21/1971, 03/18/1971, 03/16/1972   ? Flu Vaccine =>6 Months Quadrivalent PF 09/04/2018   ? Flu vaccine, inj unspecified (Historical) 09/04/2018, 08/22/2019   ? HEPATITIS B vaccine, unspecified (Historical) 09/15/1989, 10/29/1989, 11/26/1990, 06/11/2004   ? MMR Vaccine 05/01/1971, 05/13/1975, 12/25/2004   ? OPV 07/31/1970, 02/21/1971, 02/22/1972, 01/01/1983   ? Pneumococcal Vaccine(13-Val Peds/immunocompromised adult) 09/20/2018 ? Td vaccine, unspecified (Historical) 02/05/1992, 06/05/2003   ? Tdap Vaccine 04/23/2017       Appropriate recommended vaccinations were reviewed and discussed with the patient. The patient will be reminded about the importance of receiving an annual influenza vaccine as indicated.      Reproductive Risk Assessment    Ms. Gilgen is a 49 y.o. female    As patient is a female not of child-bearing potential, education was not applicable.     Risk Evaluation and Mitigation Strategy (REMS) Assessment    No REMS is required for this medication.    What to do with any unused or expired medications    Appropriate safe handling and disposal procedures were reviewed with the patient. Ms. Primmer was instructed to return any unused or expired oral chemotherapy medication to a designated disposal bin at one of the [facility] locations or to utilize a community drug take back program.  Instructed not to flush down the toilet or to crush the medication.      Follow-up Plan     Ms. Proud was encouraged to call the oral chemotherapy pharmacist at [phone] with questions. This medication is considered high risk per our internal oral chemotherapy risk categorization and the patient will be contacted for education, toxicity check at 2 weeks, and reassessment every 3 months, if applicable (high risk monitoring).      Re-assessment has been completed. Next reassessment planned for 3 month(s).     Ramond Craver, [facility]  Oncology Clinical Pharmacist  11/30/2019

## 2019-12-04 ENCOUNTER — Encounter: Admit: 2019-12-04 | Discharge: 2019-12-04 | Payer: Commercial Managed Care - PPO

## 2019-12-05 MED ORDER — ELIQUIS 5 MG PO TAB
ORAL_TABLET | Freq: Two times a day (BID) | ORAL | 0 refills | 30.00000 days | Status: DC
Start: 2019-12-05 — End: 2020-02-15

## 2019-12-12 ENCOUNTER — Encounter: Admit: 2019-12-12 | Discharge: 2019-12-12 | Payer: Commercial Managed Care - PPO

## 2019-12-12 NOTE — Telephone Encounter
Andrea [name] left a VM to let us know she is getting lab work on 2/18 as well as MRI at [facility].

## 2019-12-14 ENCOUNTER — Encounter: Admit: 2019-12-14 | Discharge: 2019-12-14 | Payer: Commercial Managed Care - PPO

## 2019-12-16 ENCOUNTER — Encounter: Admit: 2019-12-16 | Discharge: 2019-12-16 | Payer: Commercial Managed Care - PPO

## 2019-12-16 NOTE — Oral Chemotherapy Note
Name:Andrea [name]           MRN: [id number]                 DOB:1971/04/22          Age: 49 y.o.  Date of Service: 12/16/2019      Oral Chemotherapy Refill Review     Patient Information:     Correct patient/DOB/Diagnosis: Yes    Reviewed most recent clinic notes for changes in plan: Yes    Most recent labs reviewed: Yes    Appointment Information:   Date of last appointment: 10/25/2020    Follow up appointment scheduled : Yes 12/27/2019      Medication Information:     Medication name: Temodar    Medication in [place]: Yes     Date of last refill released from [facility]: 11/10/2019    Weight change >10%: No    Has a dose adjustment been made since last refill?No    Is Pharmacy listed in [place] treatment plan? Yes    [facility] [place]    Consent Date 12/15/2018- will obtain new consent at next appt.     Patient Contact  Method of communication: telephone  Confirmed need for refill: Yes  Patient taking medication as directed Yes  Confirmation of receiving pharmacy: Yes  Patient is anticipating a start date of: ongoing    Other Information: PLT 192 and ANC 2.8    Plan was routed to Dr. Magnus Ivan

## 2019-12-19 ENCOUNTER — Encounter: Admit: 2019-12-19 | Discharge: 2019-12-19 | Payer: Commercial Managed Care - PPO

## 2019-12-19 DIAGNOSIS — C719 Malignant neoplasm of brain, unspecified: Secondary | ICD-10-CM

## 2019-12-19 MED ORDER — TEMOZOLOMIDE 20 MG PO CAP
20 mg | ORAL_CAPSULE | Freq: Two times a day (BID) | ORAL | 0 refills | Status: DC
Start: 2019-12-19 — End: 2020-01-24

## 2019-12-19 MED ORDER — IMS MIXTURE TEMPLATE
10 mg/m2 | Freq: Two times a day (BID) | ORAL | 0 refills | Status: CN
Start: 2019-12-19 — End: ?

## 2019-12-19 MED ORDER — TEMOZOLOMIDE 5 MG PO CAP
5 mg | ORAL_CAPSULE | Freq: Two times a day (BID) | ORAL | 0 refills | Status: DC
Start: 2019-12-19 — End: 2020-01-24

## 2019-12-26 ENCOUNTER — Encounter: Admit: 2019-12-26 | Discharge: 2019-12-26 | Payer: Commercial Managed Care - PPO

## 2019-12-26 DIAGNOSIS — C719 Malignant neoplasm of brain, unspecified: Secondary | ICD-10-CM

## 2019-12-27 ENCOUNTER — Encounter: Admit: 2019-12-27 | Discharge: 2019-12-27 | Payer: Commercial Managed Care - PPO

## 2019-12-27 DIAGNOSIS — C719 Malignant neoplasm of brain, unspecified: Secondary | ICD-10-CM

## 2019-12-27 NOTE — Progress Notes
AUTH NEEDED FOR EXTERNAL IMAGING    Patient info:  Name:  Andrea [name]  MRN:      [id number]  DOB: 16-Jun-1971        Order sent to [facility]    Orders Placed:  yes  Diagnosis ICD10 Code:  C71.9    Ordering Provider:  Dr. Magnus Ivan    Scan needed by date:  MRI head w/wo contrast in 7-8 weeks.

## 2019-12-28 ENCOUNTER — Encounter: Admit: 2019-12-28 | Discharge: 2019-12-28 | Payer: Commercial Managed Care - PPO

## 2019-12-28 NOTE — Telephone Encounter
Called pt to advise of appointment scheduling no answer left left vm

## 2019-12-29 ENCOUNTER — Encounter: Admit: 2019-12-29 | Discharge: 2019-12-29 | Payer: Commercial Managed Care - PPO

## 2019-12-29 ENCOUNTER — Ambulatory Visit: Admit: 2019-12-29 | Discharge: 2019-12-30 | Payer: Commercial Managed Care - PPO

## 2019-12-30 ENCOUNTER — Encounter: Admit: 2019-12-30 | Discharge: 2019-12-30 | Payer: Commercial Managed Care - PPO

## 2020-01-04 ENCOUNTER — Encounter: Admit: 2020-01-04 | Discharge: 2020-01-04 | Payer: Commercial Managed Care - PPO

## 2020-01-04 DIAGNOSIS — C719 Malignant neoplasm of brain, unspecified: Secondary | ICD-10-CM

## 2020-01-11 ENCOUNTER — Encounter: Admit: 2020-01-11 | Discharge: 2020-01-11 | Payer: Commercial Managed Care - PPO

## 2020-01-12 ENCOUNTER — Encounter: Admit: 2020-01-12 | Discharge: 2020-01-12 | Payer: Commercial Managed Care - PPO

## 2020-01-12 NOTE — Progress Notes
AUTH NEEDED FOR EXTERNAL IMAGING    Patient info:  Name:Andrea [name]  AS:[id number]  DOB:[birth year]        [facility] to [facility]    Orders Placed:  yes  Diagnosis ICD10 Code:  C71.9    Ordering Provider:  Dr. Magnus Ivan    Scan needed by date:    May 1st-Feb 29 2020

## 2020-01-16 ENCOUNTER — Encounter: Admit: 2020-01-16 | Discharge: 2020-01-16 | Payer: Commercial Managed Care - PPO

## 2020-01-19 ENCOUNTER — Encounter: Admit: 2020-01-19 | Discharge: 2020-01-19 | Payer: Commercial Managed Care - PPO

## 2020-01-19 NOTE — Telephone Encounter
Pt called regarding her Eliquis prescription, she says it needs a prior Auth, but we have not received any information on that. She is going to call her Pharmacy and get the needed information and call us back.

## 2020-01-20 ENCOUNTER — Encounter: Admit: 2020-01-20 | Discharge: 2020-01-20 | Payer: Commercial Managed Care - PPO

## 2020-01-20 NOTE — Telephone Encounter
This RN has made 3 calls today to [facility] on patients behalf, as they have told patient they have sent prior auth referrals and our office has not received any of them. This RN has also provided 2 different fax numbers. Called again and spoke to [facility] and informed him we still have not received them. Was given a number to call and that number was only for Pharmacists. Will wait and see if we receive the fax and if we don't will plan to notify them again.

## 2020-01-24 ENCOUNTER — Encounter: Admit: 2020-01-24 | Discharge: 2020-01-24 | Payer: Commercial Managed Care - PPO

## 2020-01-24 DIAGNOSIS — C719 Malignant neoplasm of brain, unspecified: Secondary | ICD-10-CM

## 2020-01-24 MED ORDER — IMS MIXTURE TEMPLATE
10 mg/m2 | Freq: Two times a day (BID) | ORAL | 1 refills | Status: CN
Start: 2020-01-24 — End: ?

## 2020-01-24 MED ORDER — TEMOZOLOMIDE 20 MG PO CAP
20 mg | ORAL_CAPSULE | Freq: Two times a day (BID) | ORAL | 1 refills | Status: AC
Start: 2020-01-24 — End: 2020-03-23

## 2020-01-24 MED ORDER — TEMOZOLOMIDE 5 MG PO CAP
5 mg | ORAL_CAPSULE | Freq: Two times a day (BID) | ORAL | 1 refills | Status: AC
Start: 2020-01-24 — End: 2020-03-23

## 2020-01-24 NOTE — Oral Chemotherapy Note
Name:Zohar SUMEYA SISE           MRN: [id number]                 DOB:1971-09-21          Age: 49 y.o.  Date of Service: 01/24/2020      Oral Chemotherapy Refill Review     Patient Information:     Correct patient/DOB/Diagnosis: Yes    Reviewed most recent clinic notes for changes in plan: Yes    Most recent labs reviewed: Yes    Appointment Information:   Date of last appointment: 12/27/2019    Follow up appointment scheduled : Yes 03/06/2020      Medication Information:     Medication name: Temodar    Medication in Tea: Yes     Date of last refill released from [facility]: 12/19/2019    Weight change >10%: No    Has a dose adjustment been made since last refill?No    Is Pharmacy listed in [facility] treatment plan? Yes    Ph[facility]: [facility] [place]     Consent Date 12/15/2018- will obtain new consent at next appointment    Patient Contact  Method of communication: telephone  Confirmed need for refill: Yes  Patient taking medication as directed Yes  Confirmation of receiving pharmacy: Yes  Patient is anticipating a start date of: ongoing    Other Information: PLT 245 and ANC 3.2    Plan was routed to Dr. Magnus Ivan

## 2020-01-24 NOTE — Progress Notes
PA sent and approved for Xarelto using [email or web address]  [id number];Review Type:Prior Auth;Coverage Start Date:12/25/2019;Coverage End Date:01/23/2021;  AS:[id number]

## 2020-01-31 ENCOUNTER — Encounter: Admit: 2020-01-31 | Discharge: 2020-01-31 | Payer: Commercial Managed Care - PPO

## 2020-01-31 NOTE — Telephone Encounter
-----   Message from Ellouise Newer, RN sent at 01/31/2020 12:01 PM CDT -----  Regarding: FU with Dr Murvin Natal  Please RS patients appt to 4/20.21 FU with Dr Murvin Natal [facility] thanks :)

## 2020-02-07 ENCOUNTER — Encounter: Admit: 2020-02-07 | Discharge: 2020-02-07 | Payer: Commercial Managed Care - PPO

## 2020-02-07 IMAGING — MR Head^Brain
10 series · 47 of 48 positions shown · non-contrast
Comparison: none

[Series 2: T1 · sagittal · 5.0mm · 0.45mm/px · 3 of 20 slices shown (1 of 2)]
[im 1/20]
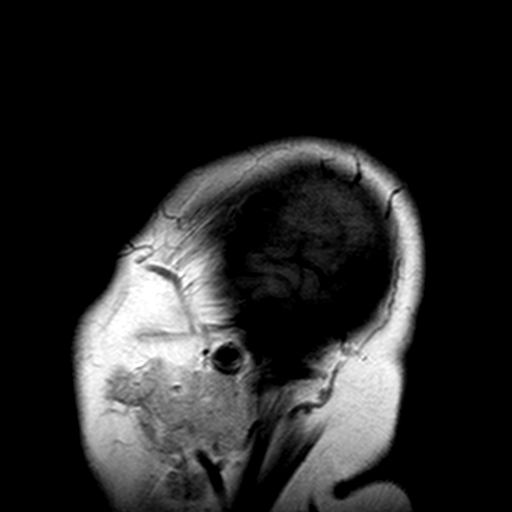
[im 10/20]
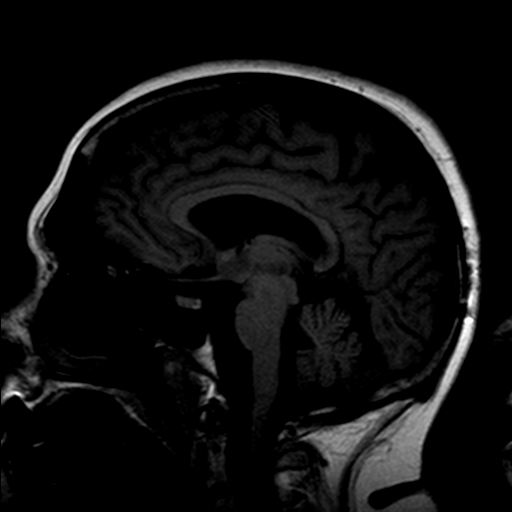
[im 20/20]
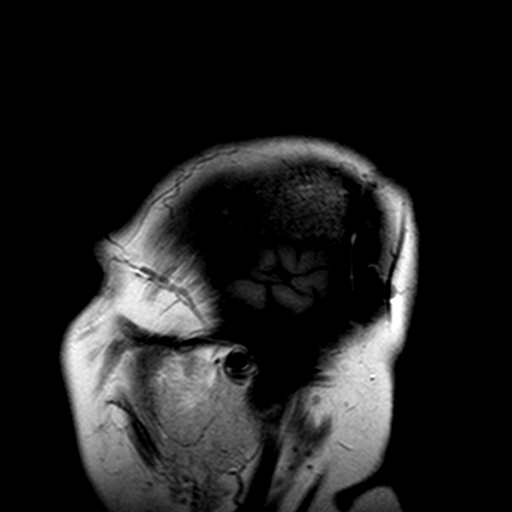

[Series 3: DWI · axial · 5.0mm · 1.80mm/px · z∈[-57,+71]mm · 11 of 63 slices shown (1 of 2)]
[im 1/63]
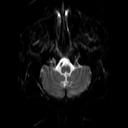
[im 7/63]
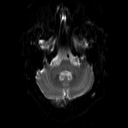
[im 13/63]
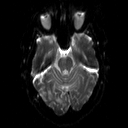
[im 19/63]
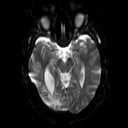
[im 25/63]
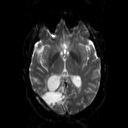
[im 32/63]
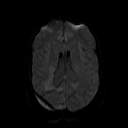
[im 38/63]
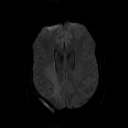
[im 44/63]
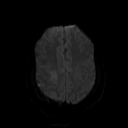
[im 50/63]
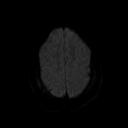
[im 56/63]
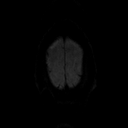
[im 63/63]
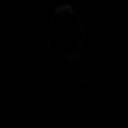

[Series 4: DWI · axial · 5.0mm · 1.80mm/px · z∈[-57,+71]mm · 4 of 21 slices shown (2 of 2)]
[im 1/21]
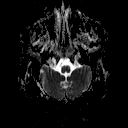
[im 7/21]
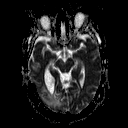
[im 14/21]
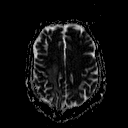
[im 21/21]
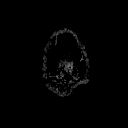

[Series 5: FLAIR · axial · 5.0mm · 0.45mm/px · z∈[-55,+89]mm · 4 of 24 slices shown]
[im 1/24]
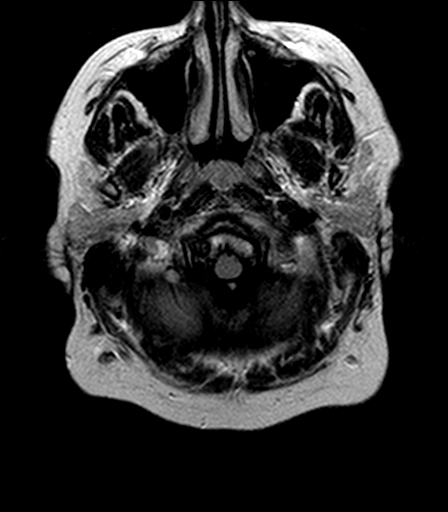
[im 8/24]
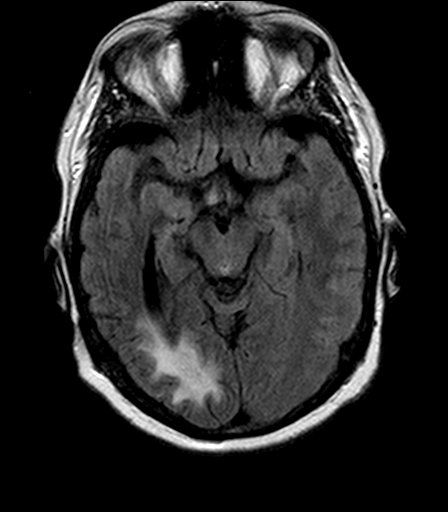
[im 16/24]
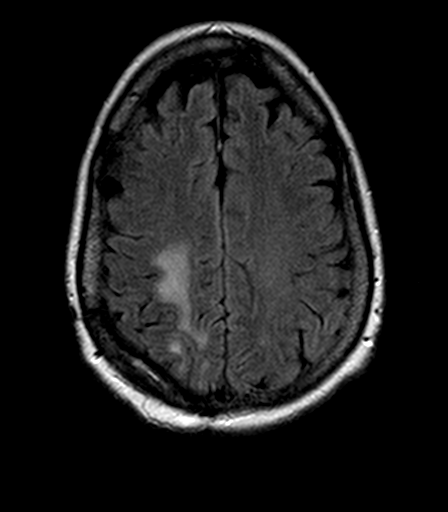
[im 24/24]
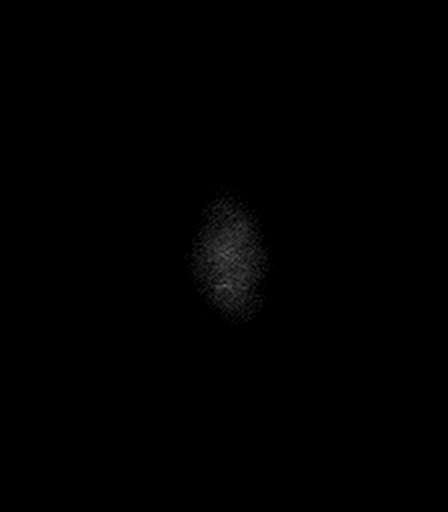

[Series 6: T2 · axial · 5.0mm · 0.72mm/px · z∈[-55,+89]mm · 4 of 24 slices shown (1 of 2)]
[im 1/24]
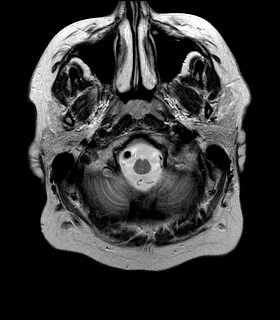
[im 8/24]
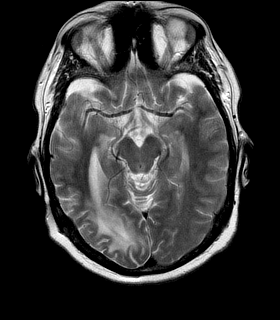
[im 16/24]
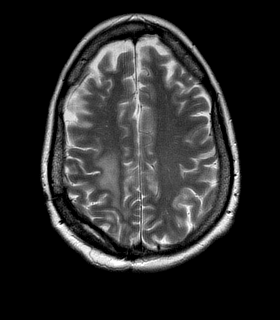
[im 24/24]
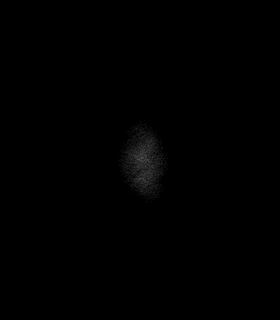

[Series 7: T1 · axial · 5.0mm · 0.45mm/px · z∈[-53,+91]mm · 4 of 24 slices shown (2 of 2)]
[im 1/24]
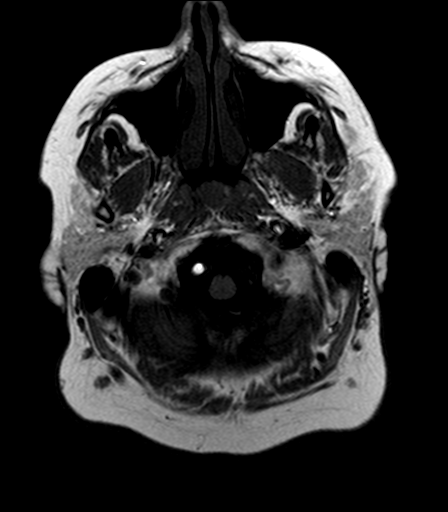
[im 8/24]
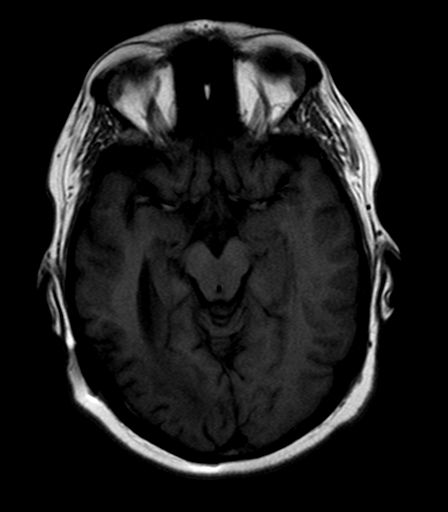
[im 16/24]
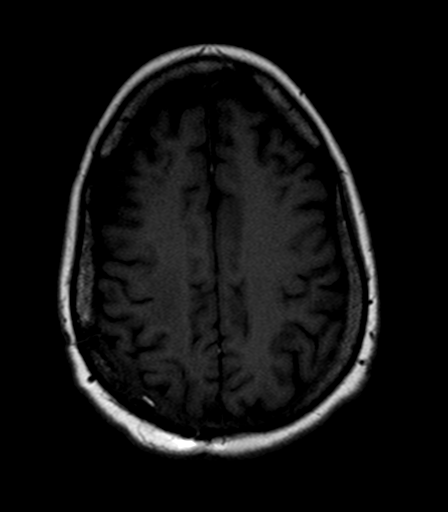
[im 24/24]
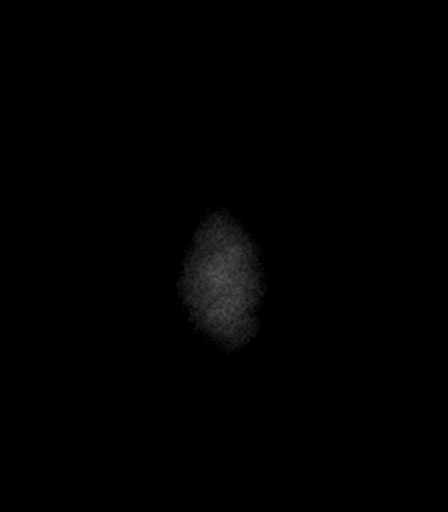

[Series 8: axial blood · axial · 5.0mm · 0.45mm/px · z∈[-42,+39]mm · 3 of 21 slices shown]
[im 1/21]
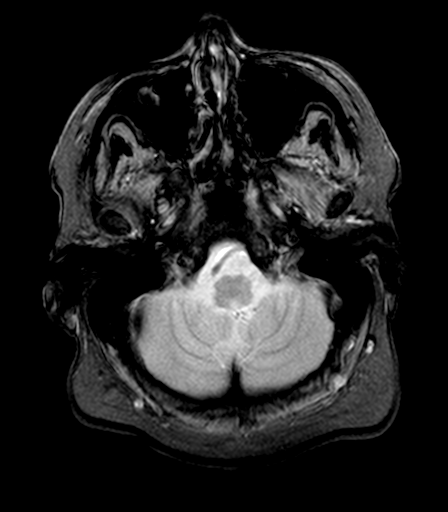
[im 7/21]
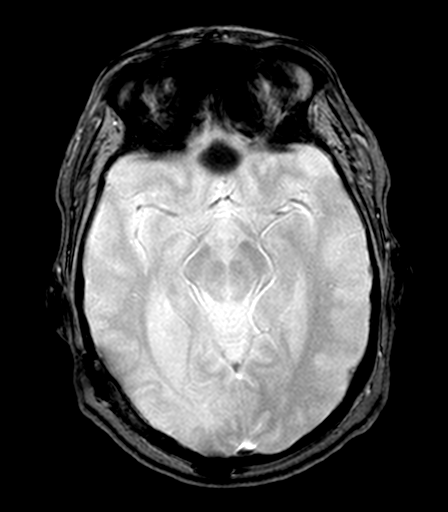
[im 14/21]
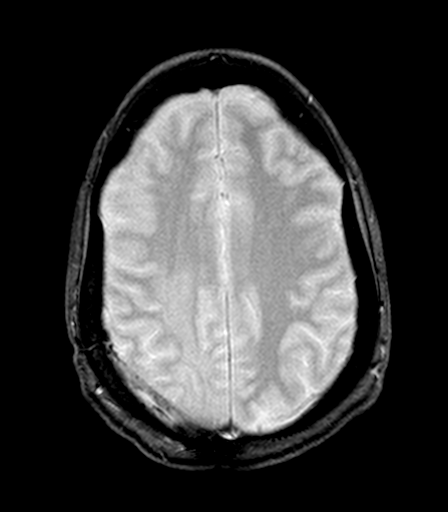

[Series 9: T2 · coronal · 5.0mm · 0.69mm/px · 5 of 26 slices shown (2 of 2)]
[im 1/26]
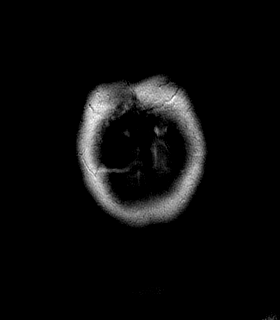
[im 7/26]
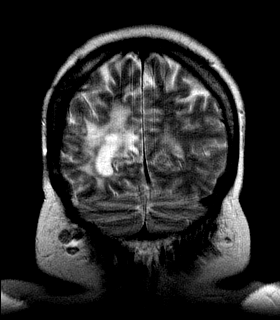
[im 13/26]
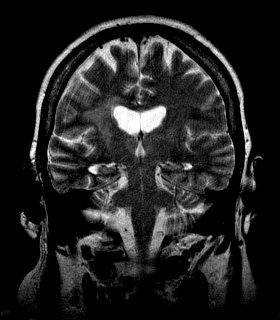
[im 19/26]
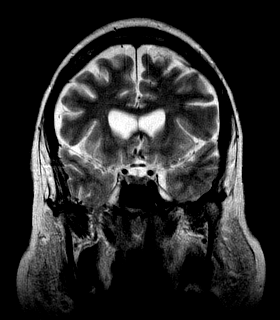
[im 26/26]
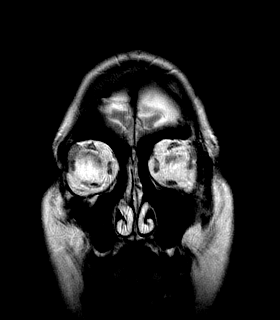

[Series 10: T1 fat-sat post-contrast · axial · 5.0mm · 0.90mm/px · z∈[-55,+89]mm · 4 of 24 slices shown (1 of 2)]
[im 1/24]
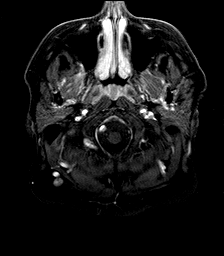
[im 8/24]
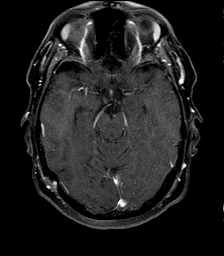
[im 16/24]
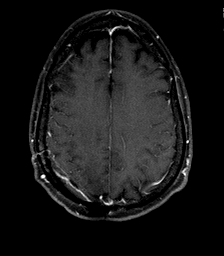
[im 24/24]
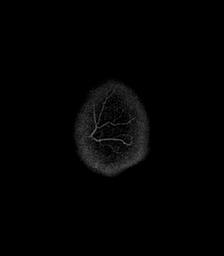

[Series 11: T1 fat-sat post-contrast · coronal · 5.0mm · 0.90mm/px · 5 of 26 slices shown (2 of 2)]
[im 1/26]
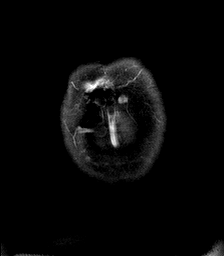
[im 7/26]
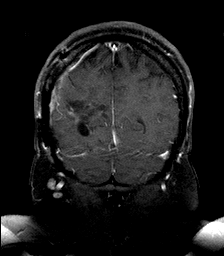
[im 13/26]
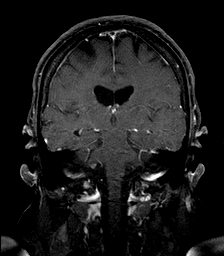
[im 19/26]
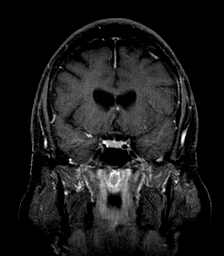
[im 26/26]
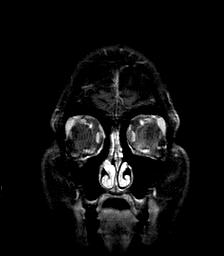

[47 of 48 positions shown; findings below may reference images not displayed]

DIAGNOSTIC STUDIES

EXAM

MRI of the brain with without contrast.

INDICATION

Glioblastoma
FU GLIOBLASTOMA, NO NEW SYMPTOMS.  15 ML GADAVIST RG

TECHNIQUE

Sagittal, axial, and coronal images were obtained with variable T1 and T2 weighting before after
administration of Gadavist.

COMPARISONS

December 15, 2019.

FINDINGS

Prior craniotomy involving the right parietal and occipital lobes is evident. Underlying
encephalomalacia changes are unchanged with residual cystic cavity measuring 3.5 x 2.4 cm compared
to 3.7 x 2.7 cm image 13 series 10. Minimal associated linear enhancement is seen surrounding the
resection cavity with overlying dural enhancement. No nodular or masslike enhancement is seen.
Extensive adjacent edema is seen involving the right parietal occipital lobes similar in extent and
degree to prior exam.

The remaining brain is unchanged. No new enhancing lesions are seen. No intracranial hemorrhage is
evident.

IMPRESSION

Prior right parietal occipital craniotomy with stable postsurgical changes. No evidence for new
enhancement or mass effect is seen. Adjacent edema involving the right parietal occipital lobes is
also stable compared to prior exam.

Tech Notes:

FU GLIOBLASTOMA, NO NEW SYMPTOMS.  15 ML GADAVIST RG

## 2020-02-08 ENCOUNTER — Encounter: Admit: 2020-02-08 | Discharge: 2020-02-08 | Payer: Commercial Managed Care - PPO

## 2020-02-08 DIAGNOSIS — C719 Malignant neoplasm of brain, unspecified: Secondary | ICD-10-CM

## 2020-02-15 MED ORDER — ELIQUIS 5 MG PO TAB
ORAL_TABLET | Freq: Two times a day (BID) | 0 refills | Status: DC
Start: 2020-02-15 — End: 2020-03-22

## 2020-03-02 ENCOUNTER — Encounter: Admit: 2020-03-02 | Discharge: 2020-03-02 | Payer: Commercial Managed Care - PPO

## 2020-03-02 NOTE — Telephone Encounter
Crislyn called to report a seizure this morning.     Date and Time of seizure(s):  Happened around 1030.     Type of seizure(s): Patient reports that she had only a small amount of head shaking, no LOC.     Description: Patient reports she walked in from outside, started feeling funny, sat on the couch.      Duration:  Seizure lasted about 10 seconds.      Post-ictal symptoms:  Fatigue for a few hours afterwards. Pateitn reports that she feels fine now.      Anti-seizure medications currently on:  Zonisamide 300mg  QHS    Additional information:      Patient reports no missed medication, illness, lack of sleep.

## 2020-03-05 ENCOUNTER — Encounter: Admit: 2020-03-05 | Discharge: 2020-03-05 | Payer: Commercial Managed Care - PPO

## 2020-03-06 ENCOUNTER — Encounter: Admit: 2020-03-06 | Discharge: 2020-03-06 | Payer: Commercial Managed Care - PPO

## 2020-03-12 ENCOUNTER — Encounter: Admit: 2020-03-12 | Discharge: 2020-03-12 | Payer: Commercial Managed Care - PPO

## 2020-03-12 NOTE — Telephone Encounter
Patient called and reported a seizure yesterday 5/16.    Suzie says the seizure was her typical event, lasted about a minute. She is not ill and hasn't missed any mediations. Will forward to Dr. Welton Flakes.

## 2020-03-16 MED ORDER — ZONISAMIDE 100 MG PO CAP
400 mg | ORAL_CAPSULE | Freq: Every evening | ORAL | 1 refills | 90.00000 days | Status: AC
Start: 2020-03-16 — End: ?

## 2020-03-22 ENCOUNTER — Encounter: Admit: 2020-03-22 | Discharge: 2020-03-22 | Payer: Commercial Managed Care - PPO

## 2020-03-22 MED ORDER — ELIQUIS 5 MG PO TAB
ORAL_TABLET | Freq: Two times a day (BID) | 0 refills | Status: DC
Start: 2020-03-22 — End: 2020-04-24

## 2020-03-23 ENCOUNTER — Encounter: Admit: 2020-03-23 | Discharge: 2020-03-23 | Payer: Commercial Managed Care - PPO

## 2020-03-23 NOTE — Progress Notes
Discontinued from Oral Oncology Patient Management Program    Andrea [name] has been removed from the Oral Oncology Patient Management Program due to completion of therapy.  Patient has discontinued taking Temodar.      The patient's provider, Dr. Bufford Buttner, has been contacted by our team to be made aware of the above information.        The patient may be re-enrolled at any time by contacting [phone].      Brent Bulla, [facility]  Clinical Pharmacist  03/23/2020

## 2020-04-12 ENCOUNTER — Encounter: Admit: 2020-04-12 | Discharge: 2020-04-12 | Payer: Commercial Managed Care - PPO

## 2020-04-13 ENCOUNTER — Encounter: Admit: 2020-04-13 | Discharge: 2020-04-13 | Payer: Commercial Managed Care - PPO

## 2020-04-13 NOTE — Progress Notes
AUTH NEEDED FOR EXTERNAL IMAGING    Patient info:  Name: Andrea [name]    MRN: [id number]          DOB: Feb 18, 1971      Insurance:   Payor: [employer] / Plan: [employer] NETWORK PPO - [phone] / Product Type: PPO /      Order sent to Facility:  [facility]    Orders Placed:  MRI Head w/wo contrast    Diagnosis ICD10 Code:  C71.9    Ordering Provider:  Darci Needle    Scan needed by date:  ASAP

## 2020-04-24 ENCOUNTER — Encounter: Admit: 2020-04-24 | Discharge: 2020-04-24 | Payer: Commercial Managed Care - PPO

## 2020-04-24 MED ORDER — ELIQUIS 5 MG PO TAB
ORAL_TABLET | Freq: Two times a day (BID) | 0 refills | Status: DC
Start: 2020-04-24 — End: 2020-05-31

## 2020-04-27 ENCOUNTER — Encounter: Admit: 2020-04-27 | Discharge: 2020-04-27 | Payer: Commercial Managed Care - PPO

## 2020-04-27 DIAGNOSIS — C719 Malignant neoplasm of brain, unspecified: Secondary | ICD-10-CM

## 2020-04-27 NOTE — Telephone Encounter
Called pt to advise of appointment reschedules no answer left vm

## 2020-05-03 ENCOUNTER — Encounter: Admit: 2020-05-03 | Discharge: 2020-05-03 | Payer: Commercial Managed Care - PPO

## 2020-05-07 ENCOUNTER — Encounter: Admit: 2020-05-07 | Discharge: 2020-05-07 | Payer: Commercial Managed Care - PPO

## 2020-05-07 DIAGNOSIS — C719 Malignant neoplasm of brain, unspecified: Secondary | ICD-10-CM

## 2020-05-07 NOTE — Progress Notes
[facility]  Brain and Spine Tumor Program        Neuro-oncology Clinic Progress Note    Patient Name: Andrea [name]  DOB: 21-Apr-1971  MRN: [id number]  DOS: 05/07/2020      Subjective:   Andrea [name] is a 49 y.o. 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    Reason For Visit:  Neuro-Oncology Care    Presentation History:  08/26/18: Presented to the ED after becoming lost while driving to her daughter's house which is 3 minutes from her home. She reports a headache for about 2 weeks which became gradually worse and she attributed to sinus problems. She states she has felt foggy lately, fatigued and her memory has been poor. Her family reports she has been running into things around the house, she drove on the wrong side of the road, forgot to go to work, and has been jumbling her words and is confused.  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 A CT head showed a large right posterior cerebrum lesion and she was transferred to [facility] for a higher level of care.  ?  08/26/18: CT Head - Large masslike process throughout the right posterior cerebrum with probably associated hemorrhage. Vasogenic edema with diffuse sulcal effacement and subfalcine herniation. ?Suggestive diffuse thickening and increased attenuation throughout the right cerebral cortex/inter up. ?  ?  08/26/18: MRI Head - ?Large cerebral necrotic enhancing mass centered within the right posterior cerebral hemisphere, favored for high-grade glioma. Cystic metastasis is considered less likely. Localized mass effect results in right hemispheric sulcal effacement, 7 mm leftward shift, and early right uncal herniation. Moderate FLAIR hyperintensity surrounding the mass and involving the bilateral rostral callosum, which may represent a combination of vasogenic edema and/or tumor elements.  ?  08/26/18: CT CAP - CHEST: No discrete primary malignancy or thoracic metastatic disease. At least mild coronary artery calcification. ABDOMEN AND PELVIS: No discrete primary malignancy or abdominopelvic metastatic disease. Probable left ovarian cyst, incompletely characterized. Small fat containing supraumbilical hernia.  ?  08/27/18: Right craniotomy -  Path: High grade glioma with necrosis, mitoses and mvp.IDH1 negative by ihc, MGMT pending  ?  08/28/18: MRI Head - Interval right parietal craniotomy and mass resection, with expected hemorrhage within the operative cavity. There is mild postoperative pneumocephalus and a relatively small amount of postoperative gas, fluid, and blood products deep to the craniotomy. Residual peripheral enhancement along the anterior inferior origin of the resection bed, most compatible with residual tumor   Improvement in right cerebral mass effect with improving leftward shift and sulcal effacement, with resolved or nearly uncal herniation. No significant change in FLAIR signal abnormality within the right posterior cerebral hemisphere and along the bilateral rostral callosum  ?  08/31/18: Admitted to inpatient rehab at [facility] with tentative DC date 09/15/18    Interval History:  Andrea [name] is seen today for telehealth visit.   She continues to have fatigue that is stable, difficulty sleeping at night is progressive but she is following with her PCP for this. Dense left hemianoposia continues is well but stable.  No seizure, takes propanolol for headache.   We reviewed her first MRI since completing her last cycle of temozolamide in May of this year. It is grossly stable with concern for mild worsening of her edema. Reviewed with our in-house neurorad and this is not concerning  for progression at this time.       Allergies:  Allergies   Allergen Reactions   ? Morphine ANAPHYLAXIS   ? Codeine RASH   ? Erythromycin SHORTNESS OF BREATH   ? Latex RASH   ? Keppra [Levetiracetam] AGITATION   ? Nsaids (Non-Steroidal Anti-Inflammatory Drug) SEE COMMENTS     History of GI bleed - has been instructed to avoid NSAID's       Vitals:  There were no vitals filed for this visit.  There is no height or weight on file to calculate BSA.    Exam (telehealth visit)  ECOG PS = 2  Awake, alert, oriented, follows linear conversation, no paraphasic errors, no difficulty with word finding, concentration and STM are intact    Laboratory Results:    CBC w diff  CBC with Diff Latest Ref Rng & Units 09/13/2019 09/11/2019 02/23/2019 12/23/2018 11/16/2018   WBC 4.5 - 11.0 K/UL 7.9 6.3 4.2 6.0 3.2(L)   RBC 4.0 - 5.0 M/UL 4.09 4.01 - 3.28(L) 3.75(L)   HGB 12.0 - 15.0 GM/DL 16.1 09.6 - 11.3(L) 12.6   HCT 36 - 45 % 37.1 36.4 - 33.5(L) 36.7   MCV 80 - 100 FL 90.8 90.6 - 102.3(H) 97.8   MCH 26 - 34 PG 31.1 31.0 - 34.4(H) 33.5   MCHC 32.0 - 36.0 G/DL 04.5 40.9 - 81.1 91.4   RDW 11 - 15 % 14.8 15.2(H) - 20.4(H) 16.7(H)   PLT 150 - 400 K/UL 244 217 202 352 22(LL)   MPV 7 - 11 FL 7.6 7.5 - 6.9(L) 7.2   NEUT 41 - 77 % 66 66 - 73 69   ANC 1.8 - 7.0 K/UL 5.25 4.15 2.7 4.50 2.20   LYMA 24 - 44 % 24 25 - 17(L) 23(L)   ALYM 1.0 - 4.8 K/UL 1.90 1.58 - 1.00 0.70(L)   MONA 4 - 12 % 7 6 - 8 5   AMONO 0 - 0.80 K/UL 0.54 0.35 - 0.50 0.20   EOSA 0 - 5 % 2 2 - 1 2   AEOS 0 - 0.45 K/UL 0.13 0.11 - 0.10 0.00   BASA 0 - 2 % 1 1 - 1 1   ABAS 0 - 0.20 K/UL 0.05 0.06 - 0.00 0.00     Comprehensive Metabolic Profile  CMP Latest Ref Rng & Units 09/13/2019 09/11/2019 12/23/2018 11/16/2018 11/01/2018   NA 137 - 147 MMOL/L 142 142 140 136(L) 139   K 3.5 - 5.1 MMOL/L 3.9 3.8 3.3(L) 3.4(L) 4.4   CL 98 - 110 MMOL/L 107 107 103 102 101   CO2 21 - 30 MMOL/L 26 27 26 23  31(H)   GAP 3 - 12 9 8 11 11 7    BUN 7 - 25 MG/DL 13 15 5(L) 22 22   CR 0.4 - 1.00 MG/DL 7.82 9.56 2.13 0.86 5.78   GLUX 70 - 100 MG/DL 90 91 99 96 99   CA 8.5 - 10.6 MG/DL 9.2 9.2 9.5 8.0(L) 8.2(L)   TP 6.0 - 8.0 G/DL 7.1 6.9 7.2 6.2 4.6(N)   ALB 3.5 - 5.0 G/DL 3.9 4.0 3.7 3.5 3.1(L)   ALKP 25 - 110 U/L 60 56 34 27 22(L)   ALT 7 - 56 U/L 11 11 39 29 25   TBILI 0.3 - 1.2 MG/DL 0.3 0.5 0.4 0.5 0.6   GFR >60 mL/min >60 >60 >60 >60 >60   GFRAA >60 mL/min >60 >60 >60 >60 >60  RADIOLOGY:  Outside imaging reviewed - question of increase in vasogenic (relayed in a typo) edema, otherwise stable.    Assessment and Plan:  Patient Active Problem List    Diagnosis Date Noted   ? Partial symptomatic epilepsy with complex partial seizures, not intractable, without status epilepticus (HCC) 12/30/2019   ? 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 Glioblastoma (HCC) 09/13/2018   ? Impaired mobility and ADLs 08/31/2018   ? Impaired cognition 08/31/2018   ? HTN (hypertension) 08/31/2018   ? Hypothyroid 08/31/2018   ? 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  Imaging demonstrated a right inferior parietal enhancing mass with surrounding edema.   - January 2020 completed radiation with concurrent temozolomide (no depakote)  - May 2021 completed 12 cycles of adjuvant temozolomide which was adjusted to BID dosing due to her comorbidities.    Her MRI is unchanged compared to prior scans as above - will follow edema at closer interval in case this is early sign of progression, clinically asymptomatic    RTC in 8 weeks with repeat MRI.     Seizure disorder - controlled, follow with Neurology as scheduled.

## 2020-05-31 ENCOUNTER — Encounter: Admit: 2020-05-31 | Discharge: 2020-05-31 | Payer: Commercial Managed Care - PPO

## 2020-05-31 MED ORDER — ELIQUIS 5 MG PO TAB
ORAL_TABLET | Freq: Two times a day (BID) | 0 refills | Status: AC
Start: 2020-05-31 — End: ?

## 2020-06-16 ENCOUNTER — Encounter: Admit: 2020-06-16 | Discharge: 2020-06-16 | Payer: Commercial Managed Care - PPO

## 2020-06-16 NOTE — Progress Notes
Patient if [facility] neurosurgery is in ED with complaints of headache and blurred vision. CT reveals no acute processes; however, Dr. Charleston Ropes would like to consult with [facility] neurosurgery before discharging patient from ED.

## 2020-06-18 ENCOUNTER — Encounter: Admit: 2020-06-18 | Discharge: 2020-06-18 | Payer: Commercial Managed Care - PPO

## 2020-06-27 ENCOUNTER — Encounter: Admit: 2020-06-27 | Discharge: 2020-06-27 | Payer: PRIVATE HEALTH INSURANCE

## 2020-06-28 IMAGING — CR CHEST
2 series · 2 of 2 positions shown · non-contrast
Comparison: none

[chest pa x-wise]
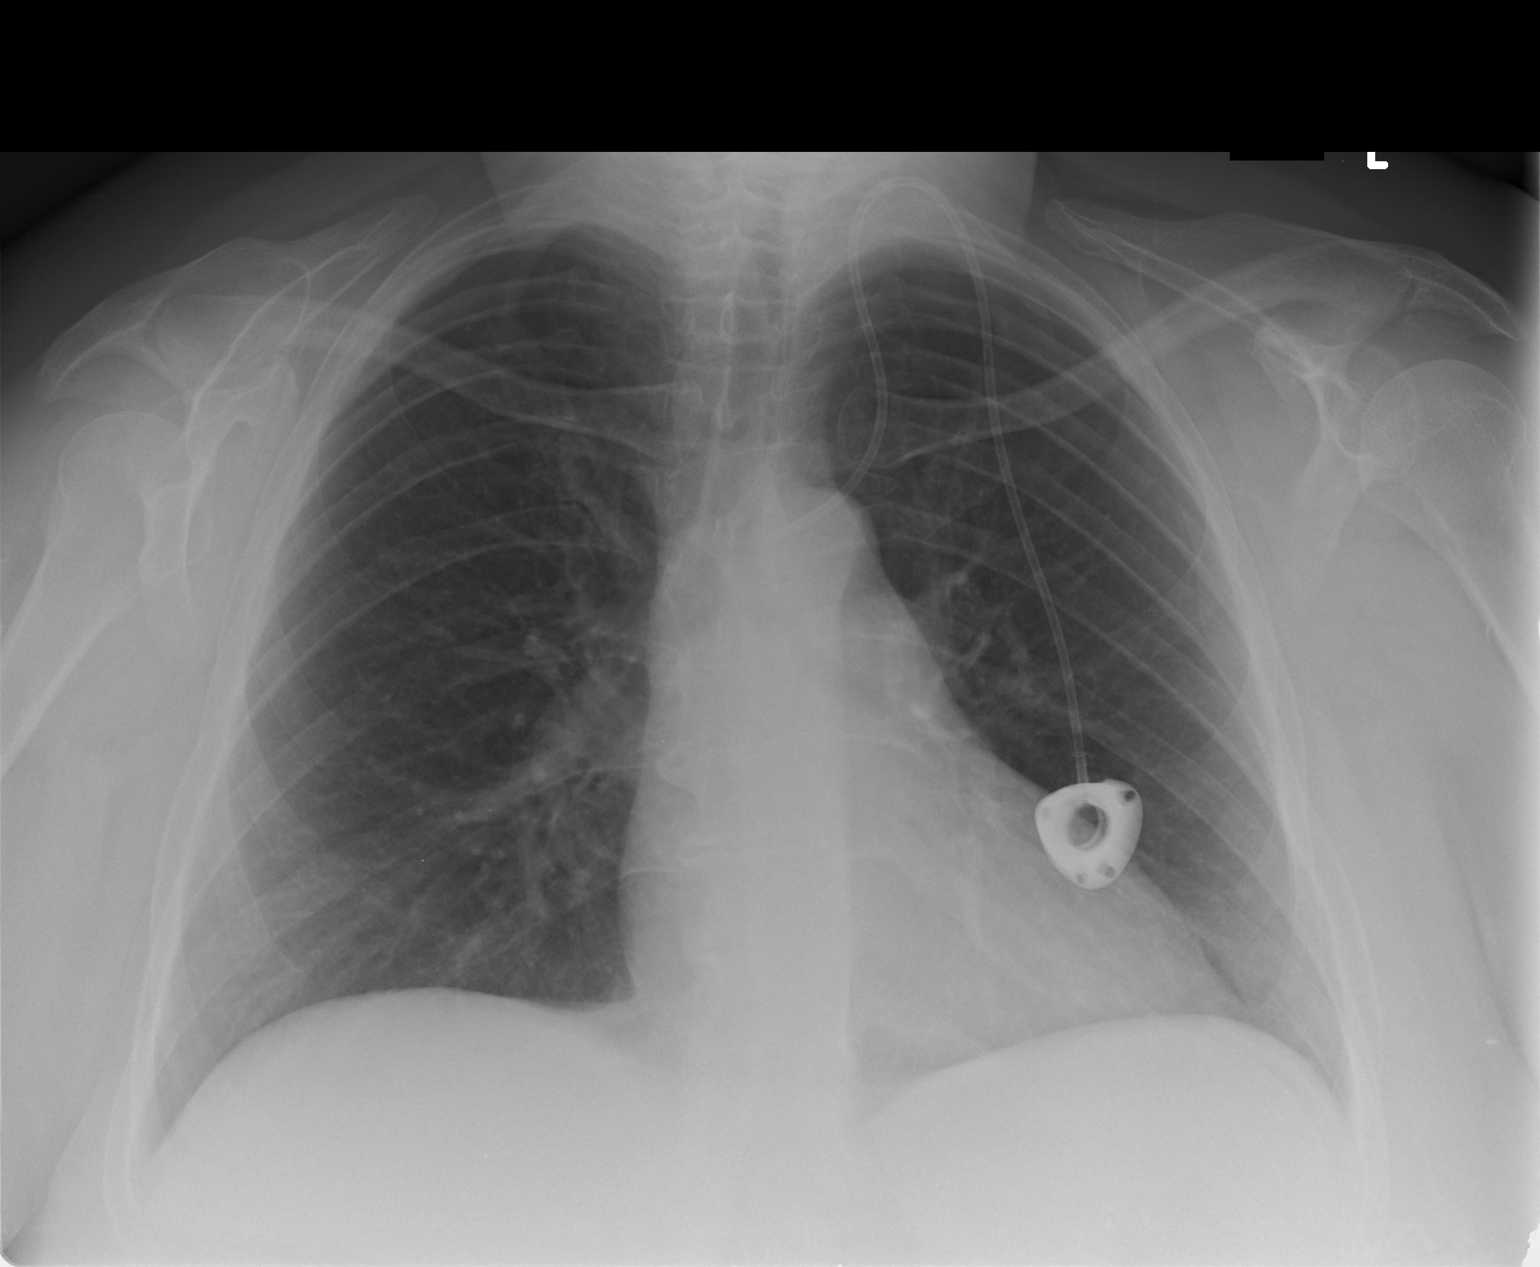

[chest lat]
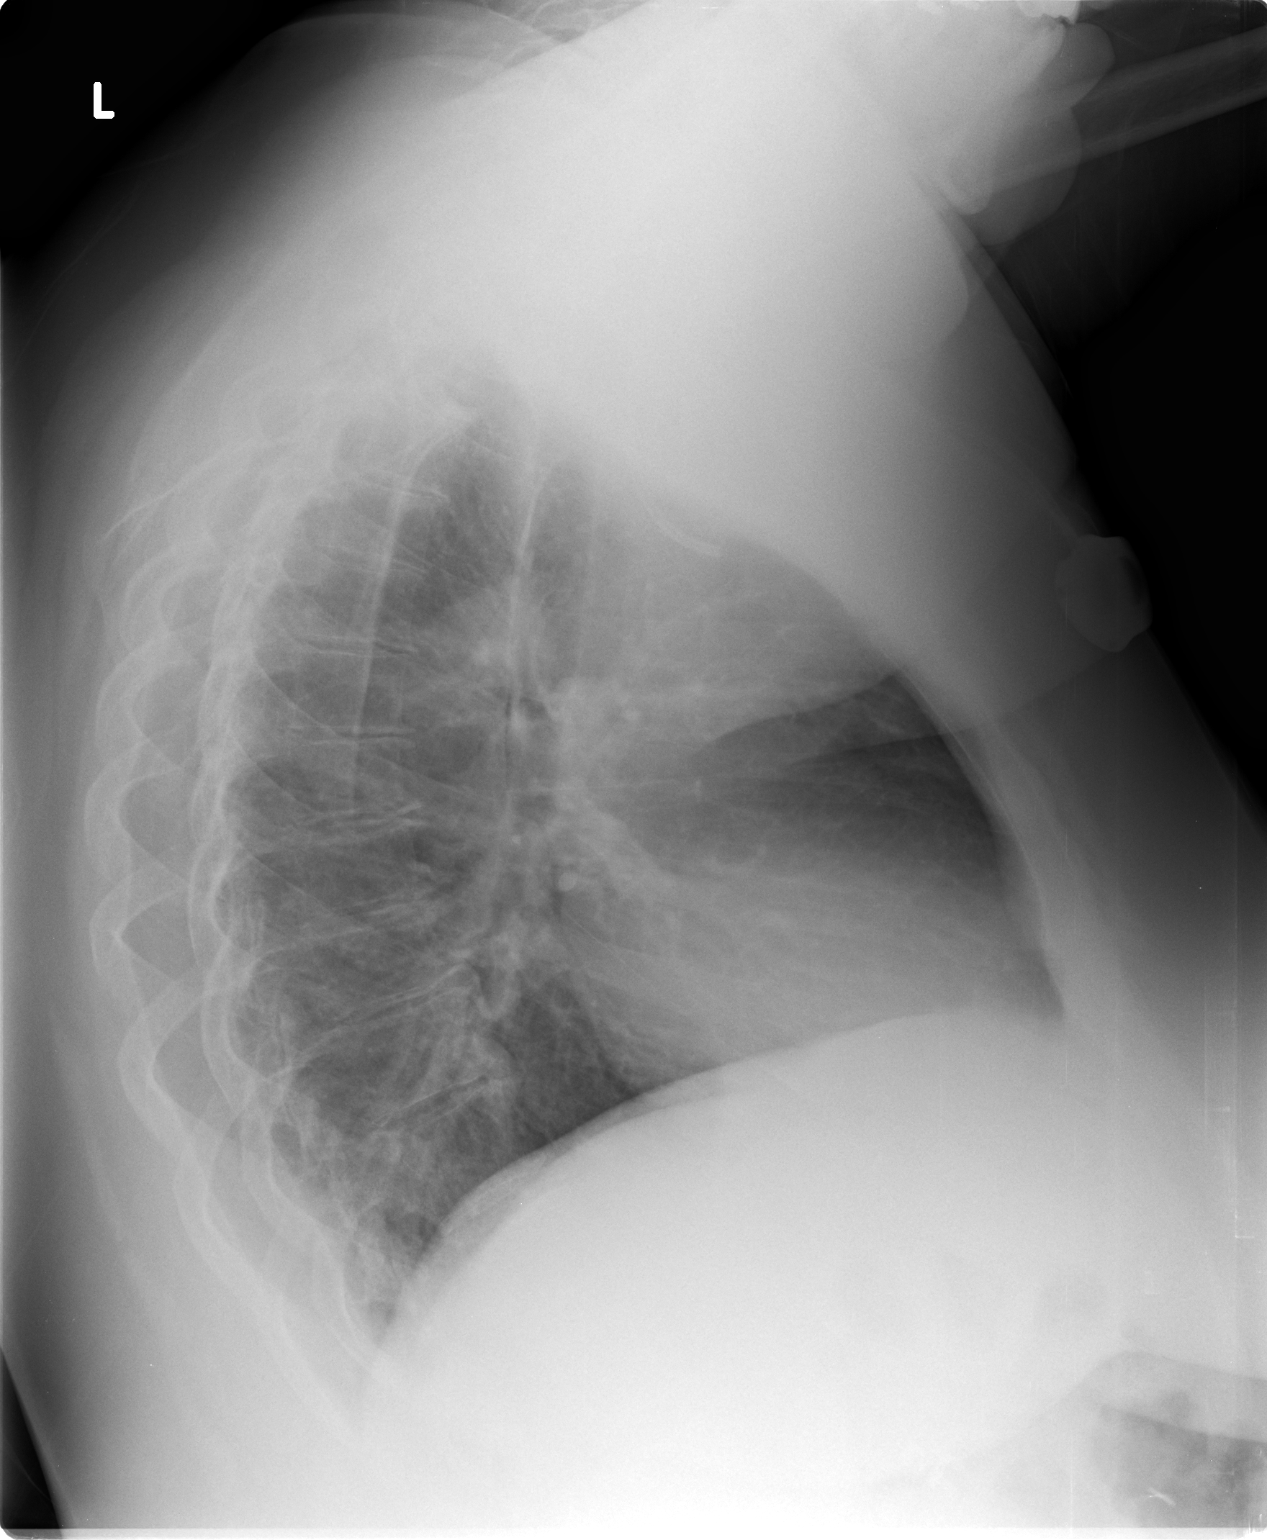

[2 of 2 positions shown; findings below may reference images not displayed]

EXAM

CHEST 2 VIEW

INDICATION

cough/shortness of breath
Pt c/o shortness of breath and cough that started this morning. H/o pulmonary embolism and
hypertension. Prior CXR 09/23/14. TB/CF

TECHNIQUE

2 views of the chest were acquired.

COMPARISONS

September 19, 2019

FINDINGS

The cardiomediastinal silhouette is within normal limits.  There is no failure or effusion. There
is no consolidation.

IMPRESSION

No acute lung process.

Tech Notes:

Pt c/o shortness of breath and cough that started this morning. H/o pulmonary embolism and
hypertension. Prior CXR 09/23/14. TB/CF

## 2020-06-28 IMAGING — CT PE(Adult)
2 of 4 series · 17 of 36 positions shown · non-contrast
Comparison: none

[Series 7: cta pulmonary cor 2.00 bv36 s3 · coronal · 0.65mm/px · 3 of 174 slices shown]
[im 35/174  mediastinal]
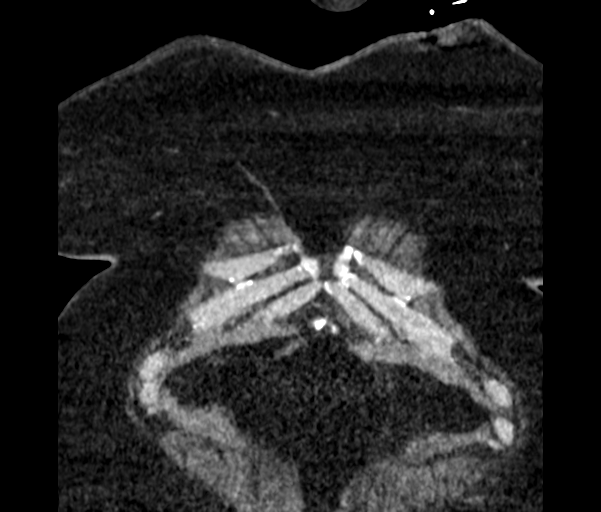
[im 70/174  mediastinal]
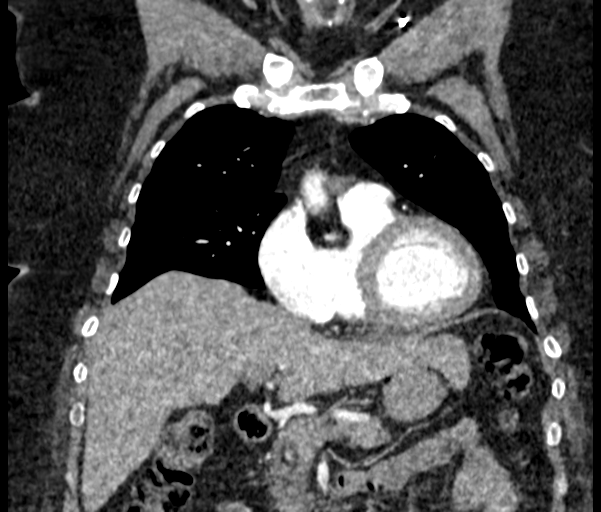
[im 104/174  mediastinal]
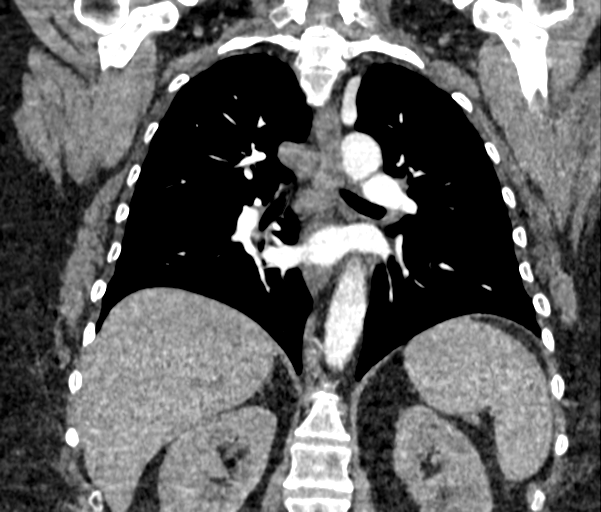

[Series 11: cta pulmonary ax 1.50 br60 s3 · axial · 0.68mm/px · z∈[+1559,+1844]mm · 14 of 220 slices shown]
[im 15/220  lung]
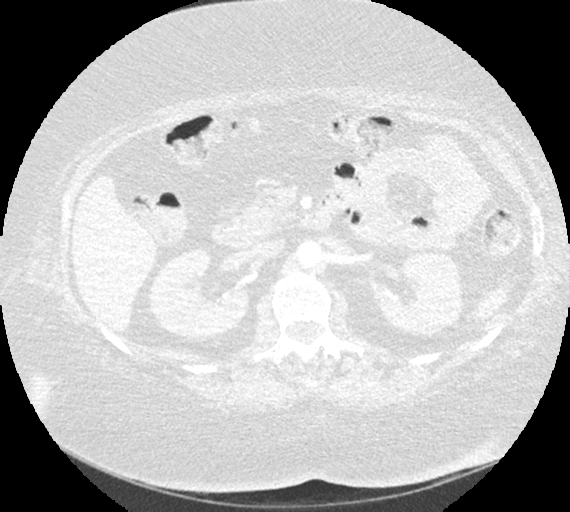
[im 30/220  mediastinal]
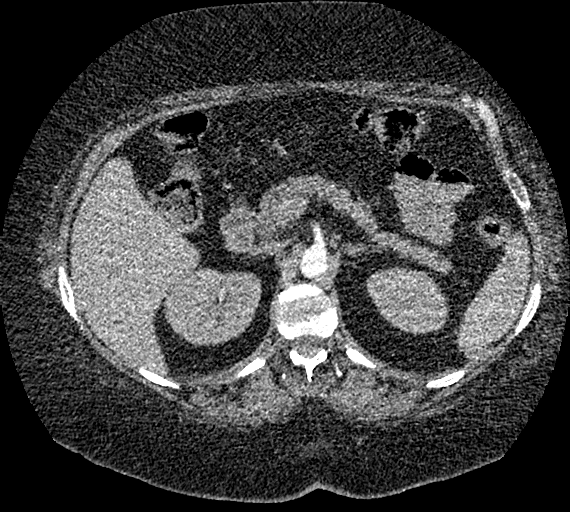
[im 44/220  lung]
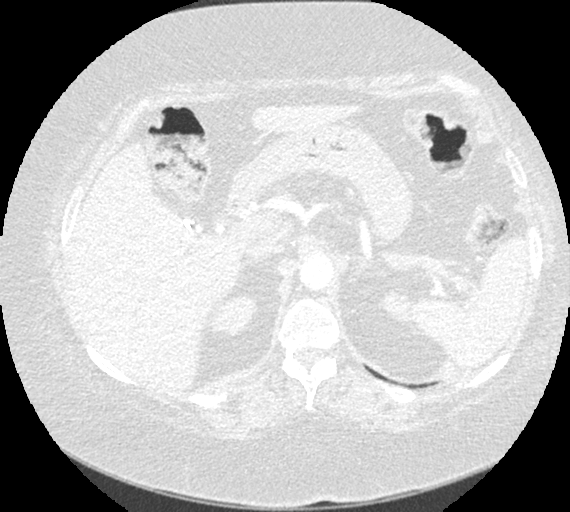
[im 59/220  mediastinal]
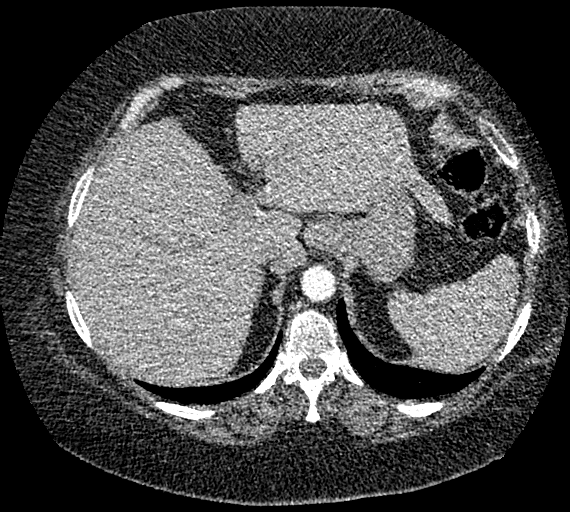
[im 74/220  lung]
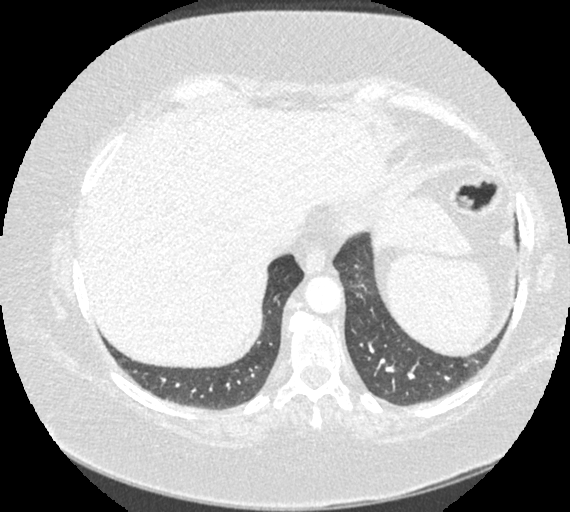
[im 88/220  mediastinal]
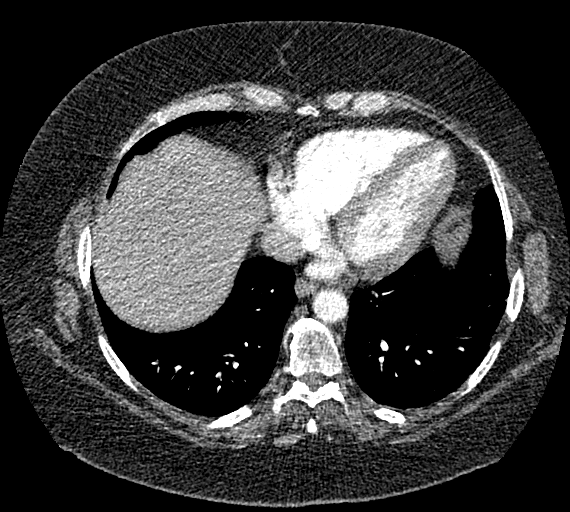
[im 103/220  lung]
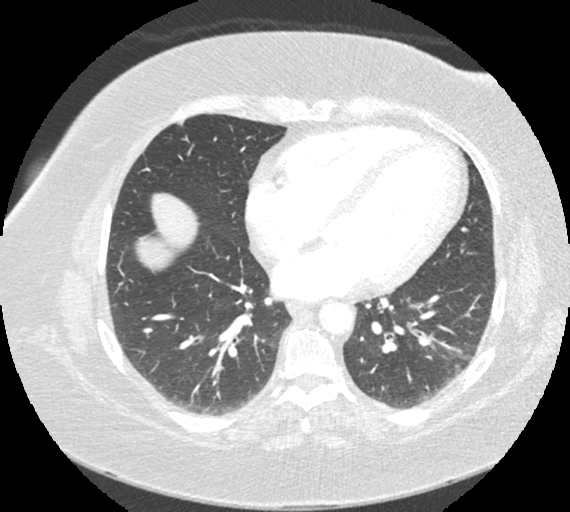
[im 117/220  mediastinal]
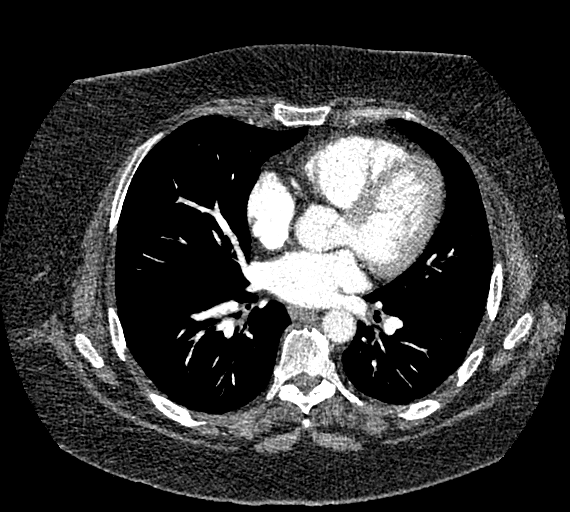
[im 132/220  lung]
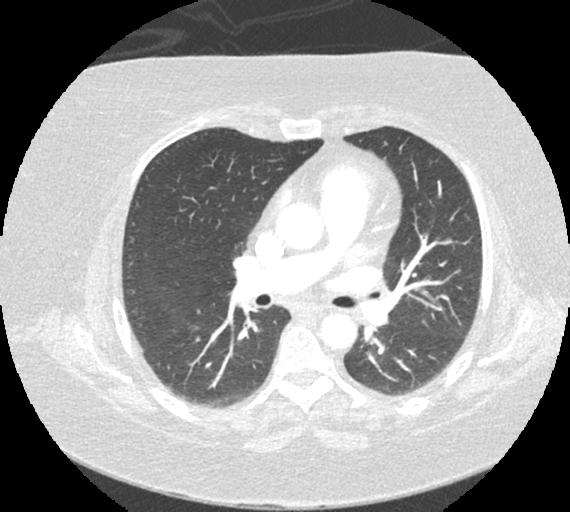
[im 147/220  mediastinal]
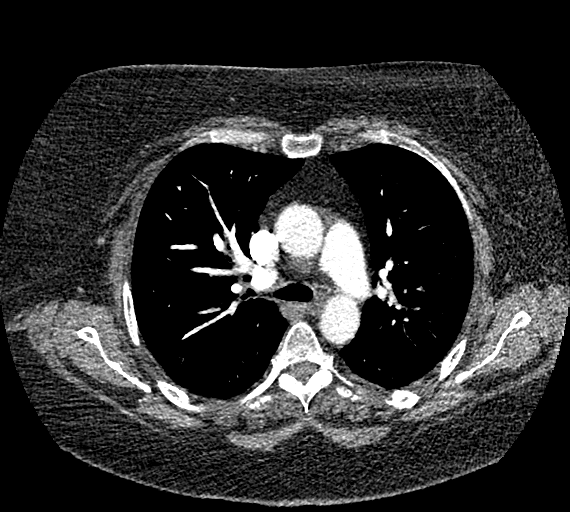
[im 161/220  lung]
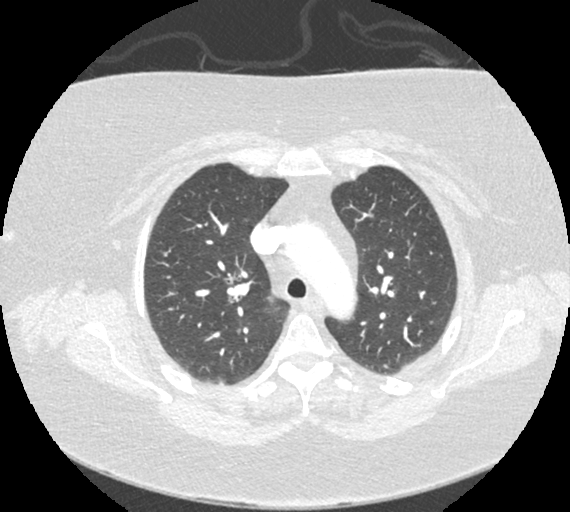
[im 176/220  mediastinal]
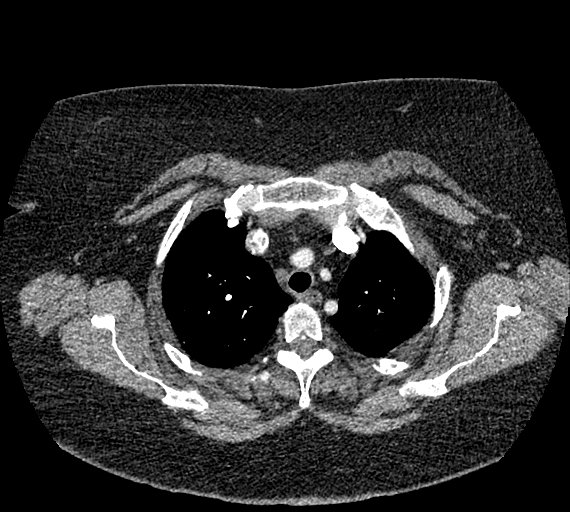
[im 190/220  lung]
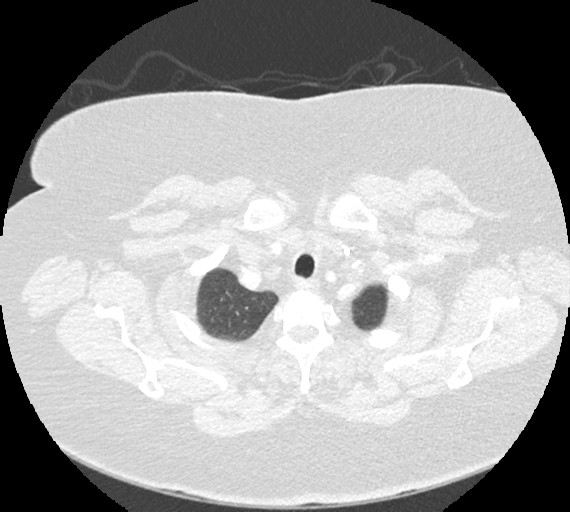
[im 205/220  mediastinal]
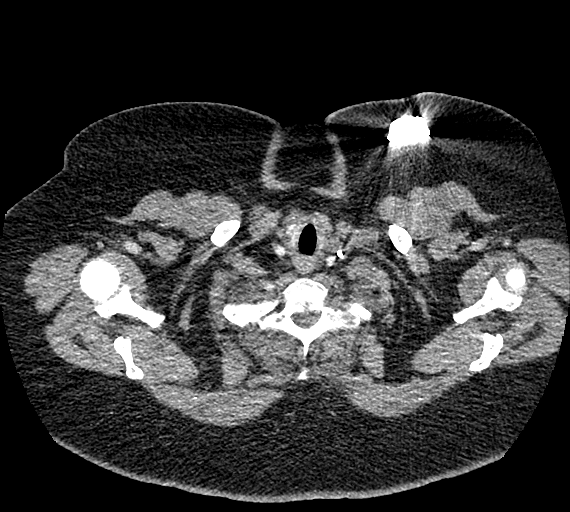

[17 of 36 positions shown; findings below may reference images not displayed]

DIAGNOSTIC STUDIES

EXAM

CTA of the chest with IV contrast

INDICATION

shortness of breath/PE
PT HAS SHORTNESS OF BREATH. COUGH. HX OF BRAIN CA. GIVEN 100 ML OMNI 350 WITH 105 GFR AND .91
GHOL[facility]I.

TECHNIQUE

All CT scans at this facility use dose modulation, iterative reconstruction, and/or weight based
dosing when appropriate to reduce radiation dose to as low as reasonably achievable. MIP reformats
created at the workstation

Number of previous computed tomography exams in the last 12 months is 4.

Number of previous nuclear medicine myocardial perfusion studies in the last 12 months is 0  .

COMPARISONS

December 06, 2018

FINDINGS

There is adequate opacification of the pulmonary arteries. No pulmonary embolism.

The heart size is normal. Thoracic aorta is normal in caliber. Coronary artery calcifications. No
enlarged mediastinal or hilar adenopathy by size criteria. Left chest port is in place.

No focal pneumonia, pleural effusion, or pneumothorax. Minimal atelectasis in the left lung base.
Likely fissural node along the minor fissure on series 5, image 65 measuring up to 6 millimeters.
Addition tiny nodules along the lower aspect of the right upper lobe, for example measuring 4
millimeters on the same image. Left upper lobe 4 millimeter nodule on image 73. These nodules were
not seen on the prior from December 06, 2018.

A gallbladder surgically absent. No acute osseous findings.

IMPRESSION

No pulmonary embolism.

No focal pneumonia. Mild atelectasis in the left lung base.

Small pulmonary nodules not seen on the prior from December 06, 2018.

Tech Notes:

PT HAS SHORTNESS OF BREATH. COUGH. HX OF BRAIN CA. GIVEN 100 ML OMNI 350 WITH 105 GFR AND .91
GHOLAMI.

## 2020-08-06 ENCOUNTER — Encounter: Admit: 2020-08-06 | Discharge: 2020-08-06 | Payer: PRIVATE HEALTH INSURANCE

## 2020-08-06 MED ORDER — ZONISAMIDE 100 MG PO CAP
400 mg | ORAL_CAPSULE | Freq: Every evening | ORAL | 5 refills | 90.00000 days | Status: AC
Start: 2020-08-06 — End: ?

## 2020-08-13 ENCOUNTER — Encounter: Admit: 2020-08-13 | Discharge: 2020-08-13 | Payer: PRIVATE HEALTH INSURANCE

## 2020-08-30 ENCOUNTER — Encounter: Admit: 2020-08-30 | Discharge: 2020-08-30 | Payer: PRIVATE HEALTH INSURANCE

## 2020-08-30 NOTE — Telephone Encounter
Patient called with concerns related to COVID.  ?  Patient experiencing any life-threatening symptoms? No  Extreme difficulty breathing, blue lips/face, severe/constant pain or pressure in the chest, altered mental status, slurred speech, seizure, coughing up blood, too weak to stand, etc.  - If yes, 911 or ED recommended.   ?  Known or suspected contact with COVID positive case? Yes, grandson  ?  Patient in long term care, skilled nursing, etc.? No  ?  COVID symptoms? Yes, Cough, Diarrhea, Congestion and Runny Nose  ?  Date symptoms began: 08/29/20  ?  Assessment: COVID suspected.  ?  Plan: Recommended telemed visit.  ?  [place] Testing Guidelines  ?  CDC Testing Guidelines  ?  Pt has an MRI scheduled for 11/8.  One yr old grandson, that she has close contact too, tested positive for COVID 11/3.  Pt lives in [place], [place]., will get tested locally.  If positive pt will reschedule test.

## 2020-08-30 NOTE — Telephone Encounter
Patient called and wanted guidance on how to proceed. She took a home COVID-19 test last night and tested positive after exposure to her COVID-19 positive grandson.  Advised her to forward results to [email or web address]. She has an MRI scheduled for Monday Nov. 8, 2021. Advised to talk to her neurosurgeon on how to proceed.  She is c/o sinus drainage, cough, congestion, diarrhea.   Given instructions on quarantine precautions and she verbalized understanding.   She will discuss with her physician on how to proceed with the MRI schedule and her follow up appointment.

## 2020-09-05 ENCOUNTER — Encounter: Admit: 2020-09-05 | Discharge: 2020-09-05 | Payer: PRIVATE HEALTH INSURANCE

## 2020-09-05 MED ORDER — ELIQUIS 5 MG PO TAB
ORAL_TABLET | Freq: Two times a day (BID) | 0 refills
Start: 2020-09-05 — End: ?

## 2020-10-03 ENCOUNTER — Encounter: Admit: 2020-10-03 | Discharge: 2020-10-03 | Payer: PRIVATE HEALTH INSURANCE

## 2020-10-15 ENCOUNTER — Encounter: Admit: 2020-10-15 | Discharge: 2020-10-15 | Payer: PRIVATE HEALTH INSURANCE

## 2020-10-15 MED ORDER — ELIQUIS 5 MG PO TAB
ORAL_TABLET | Freq: Two times a day (BID) | 0 refills
Start: 2020-10-15 — End: ?

## 2020-10-22 IMAGING — CT BRAIN WO(Adult)
1 series · 12 of 30 positions shown, 15 images · non-contrast
Comparison: none

[Series 8: brain ax 2.00 hr60 s3 · axial · 0.31mm/px · z∈[-528,-393]mm · 12 of 36 slices shown, 15 images]
[im 3/36  brain]
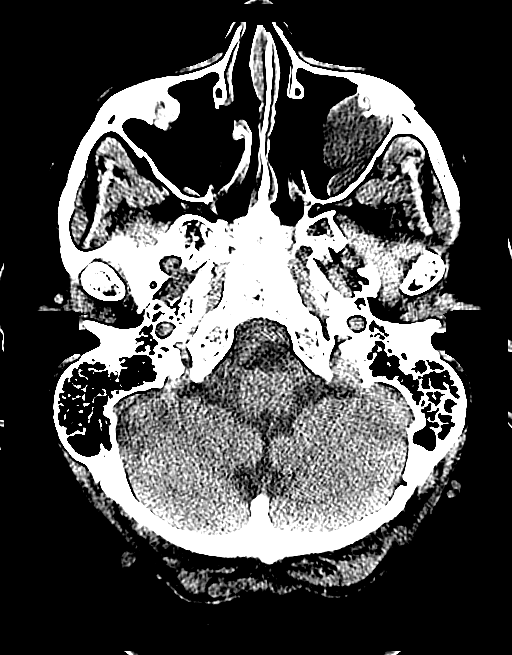
[im 3/36  bone]
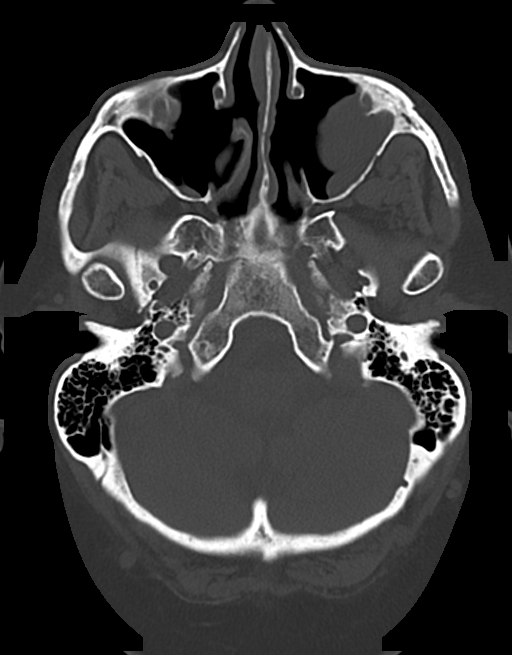
[im 5/36  brain]
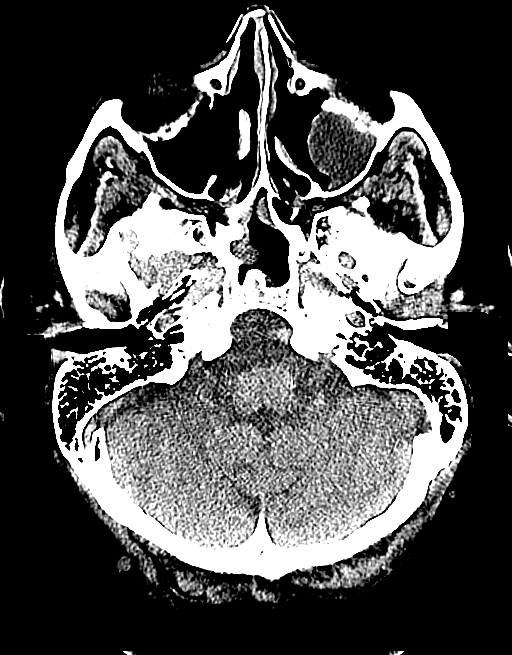
[im 8/36  brain]
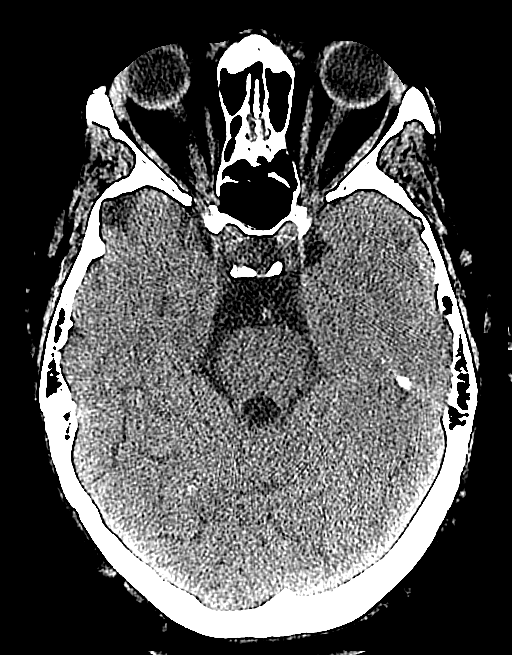
[im 11/36  brain]
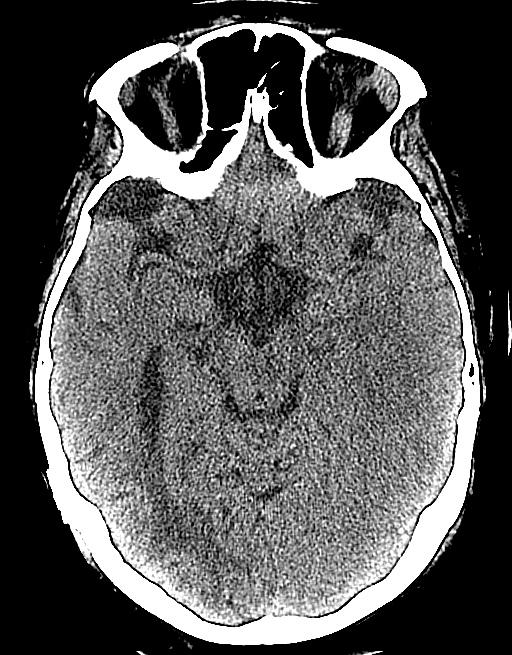
[im 14/36  brain]
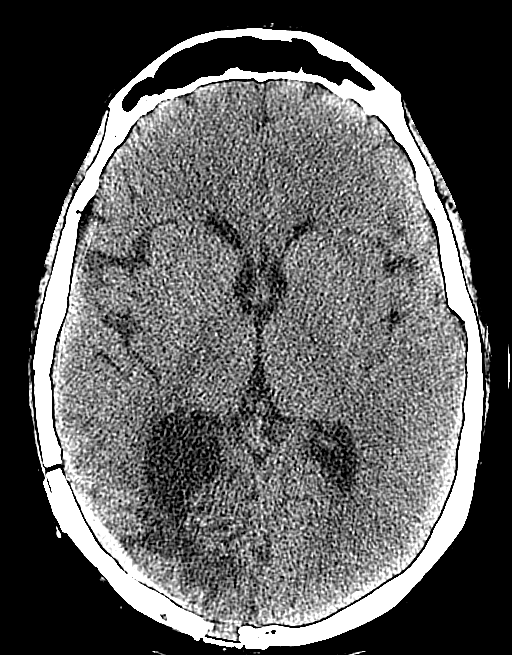
[im 14/36  bone]
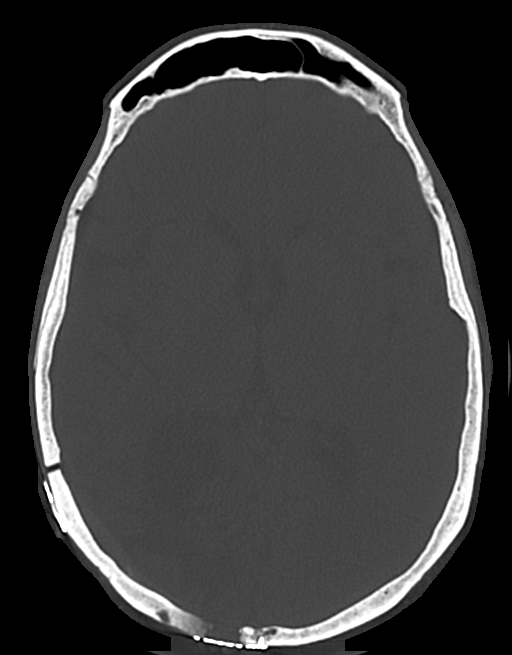
[im 16/36  brain]
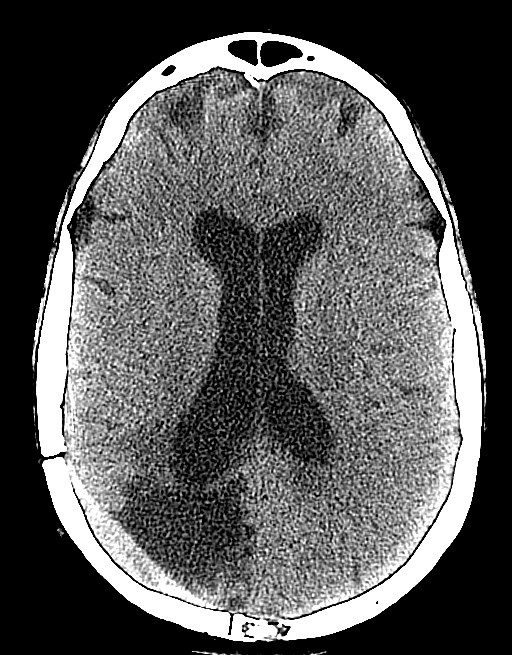
[im 20/36  brain]
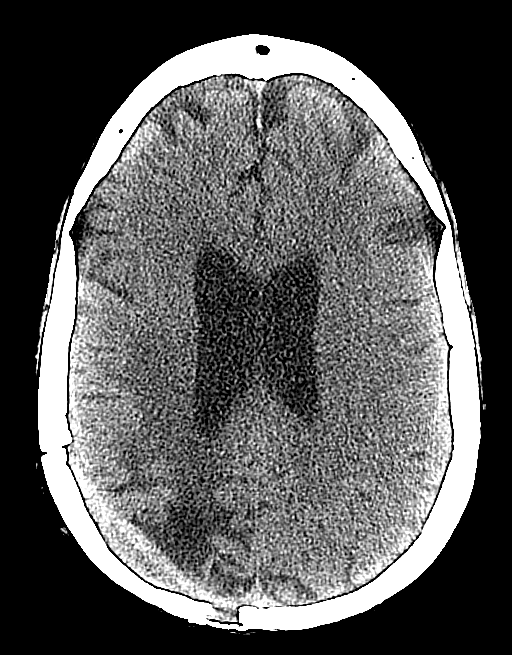
[im 22/36  brain]
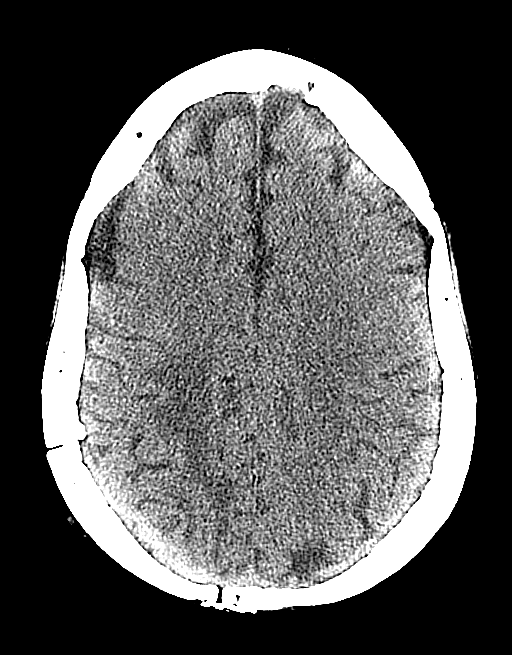
[im 25/36  brain]
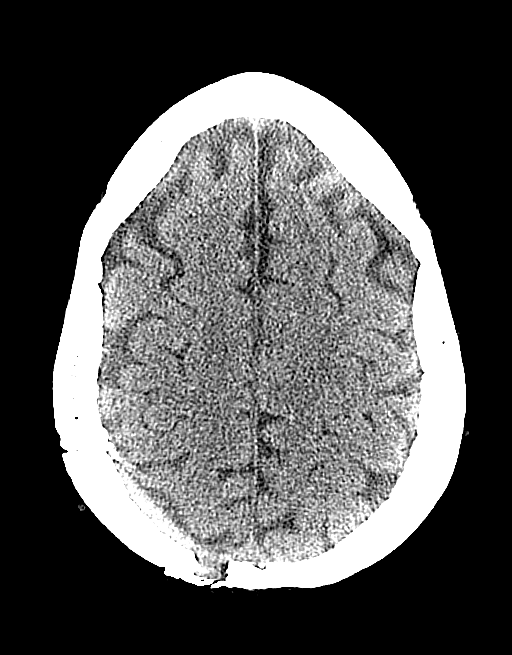
[im 25/36  bone]
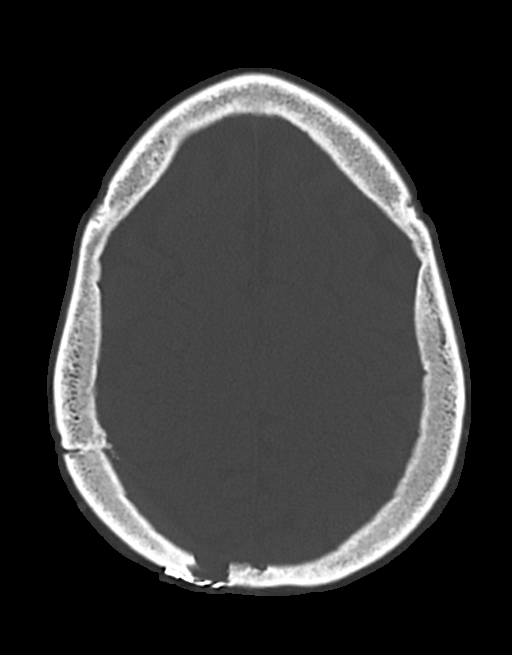
[im 28/36  brain]
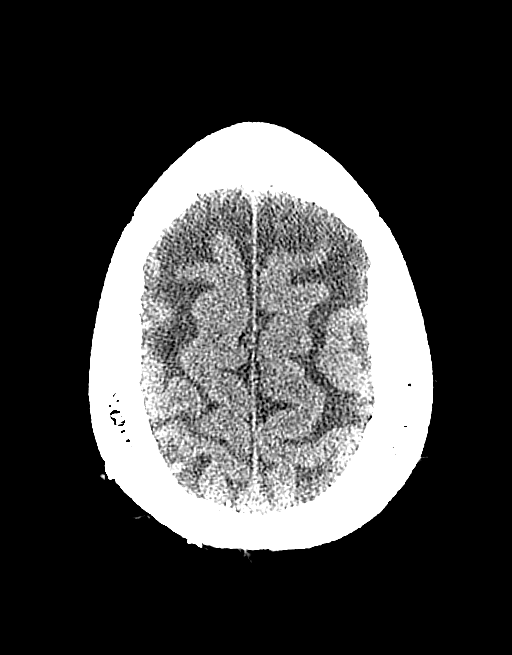
[im 31/36  brain]
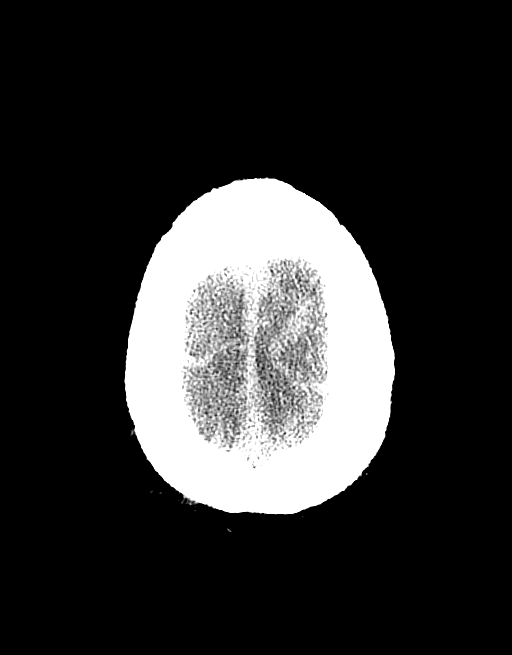
[im 33/36  brain]
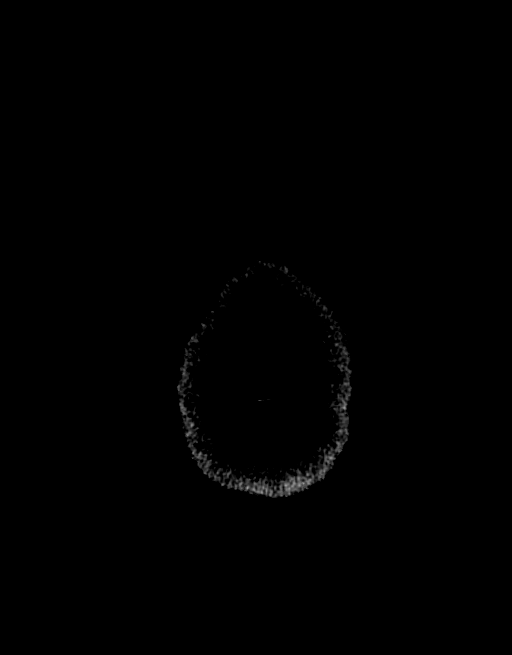

[12 of 30 positions shown; findings below may reference images not displayed]

DIAGNOSTIC STUDIES

EXAM

INDICATION

glio hx  seizure
pt c/o seizure today. hx of glioblastoma with sx. prev 06/28/20.  ct/nm:3/0

TECHNIQUE

All CT scans at this facility use dose modulation, iterative reconstruction, and/or weight based
dosing when appropriate to reduce radiation dose to as low as reasonably achievable.

Number of previous computed tomography exams in the last 12 months is 0  .

Number of previous nuclear medicine myocardial perfusion studies in the last 12 months is 0  .

COMPARISONS

06/16/2020

FINDINGS

Stable postsurgical changes with right posterior craniotomy and right occipital encephalomalacia

No new mass or collection. No acute hemorrhage. The basilar cisterns are patent. The ventricles are
normal in size. Normal gray-white matter differentiation left maxillary mucous retention cyst.
Mucoperiosteal thickening of the sphenoid sinuses. Patent mastoid air cells

IMPRESSION

No acute intracranial abnormality

Tech Notes:

pt c/o seizure today. hx of glioblastoma with sx. prev 06/28/20.
ct/nm:3/0

## 2020-10-29 ENCOUNTER — Encounter: Admit: 2020-10-29 | Discharge: 2020-10-29 | Payer: PRIVATE HEALTH INSURANCE

## 2020-11-01 ENCOUNTER — Encounter: Admit: 2020-11-01 | Discharge: 2020-11-01 | Payer: PRIVATE HEALTH INSURANCE

## 2020-11-01 MED ORDER — LAMOTRIGINE 25 MG PO TAB
ORAL_TABLET | Freq: Every day | 6 refills | Status: AC
Start: 2020-11-01 — End: ?

## 2020-11-21 ENCOUNTER — Encounter: Admit: 2020-11-21 | Discharge: 2020-11-21 | Payer: PRIVATE HEALTH INSURANCE

## 2020-11-21 MED ORDER — ELIQUIS 5 MG PO TAB
ORAL_TABLET | Freq: Two times a day (BID) | 0 refills | Status: AC
Start: 2020-11-21 — End: ?

## 2020-12-20 ENCOUNTER — Encounter: Admit: 2020-12-20 | Discharge: 2020-12-20 | Payer: PRIVATE HEALTH INSURANCE

## 2020-12-20 MED ORDER — ELIQUIS 5 MG PO TAB
ORAL_TABLET | Freq: Two times a day (BID) | 0 refills | Status: AC
Start: 2020-12-20 — End: ?

## 2021-01-11 ENCOUNTER — Encounter: Admit: 2021-01-11 | Discharge: 2021-01-11 | Payer: PRIVATE HEALTH INSURANCE

## 2021-01-20 ENCOUNTER — Encounter: Admit: 2021-01-20 | Discharge: 2021-01-20 | Payer: PRIVATE HEALTH INSURANCE

## 2021-01-20 MED ORDER — ELIQUIS 5 MG PO TAB
ORAL_TABLET | Freq: Two times a day (BID) | 0 refills
Start: 2021-01-20 — End: ?

## 2021-01-21 ENCOUNTER — Encounter: Admit: 2021-01-21 | Discharge: 2021-01-21 | Payer: PRIVATE HEALTH INSURANCE

## 2021-01-21 MED ORDER — APIXABAN 5 MG PO TAB
5 mg | ORAL_TABLET | Freq: Two times a day (BID) | ORAL | 0 refills | Status: AC
Start: 2021-01-21 — End: ?

## 2021-01-21 NOTE — Telephone Encounter
Per Dr. Lynnea Maizes - needs to get future refills from PCP or neurooncologist. Otherwise f/u with me. One month provided

## 2021-01-30 ENCOUNTER — Encounter: Admit: 2021-01-30 | Discharge: 2021-01-30 | Payer: PRIVATE HEALTH INSURANCE

## 2021-01-30 MED ORDER — ZONISAMIDE 100 MG PO CAP
ORAL_CAPSULE | Freq: Every day | 1 refills
Start: 2021-01-30 — End: ?

## 2021-02-20 ENCOUNTER — Encounter: Admit: 2021-02-20 | Discharge: 2021-02-20 | Payer: PRIVATE HEALTH INSURANCE

## 2021-02-20 IMAGING — MR Head^Brain
9 series · 42 of 48 positions shown · non-contrast
Comparison: none

[Series 2: T1 · sagittal · 5.0mm · 0.45mm/px · 5 of 19 slices shown (1 of 2)]
[im 1/19]
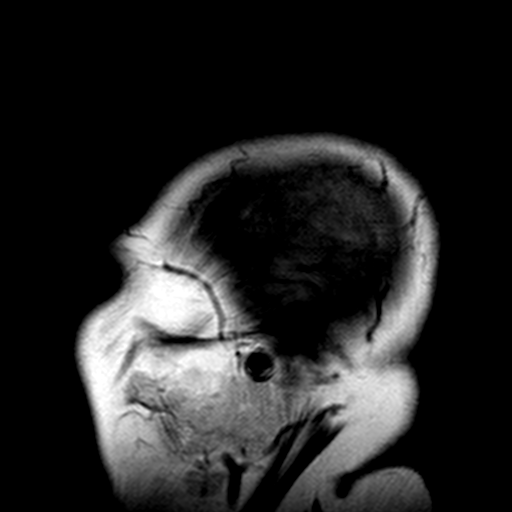
[im 5/19]
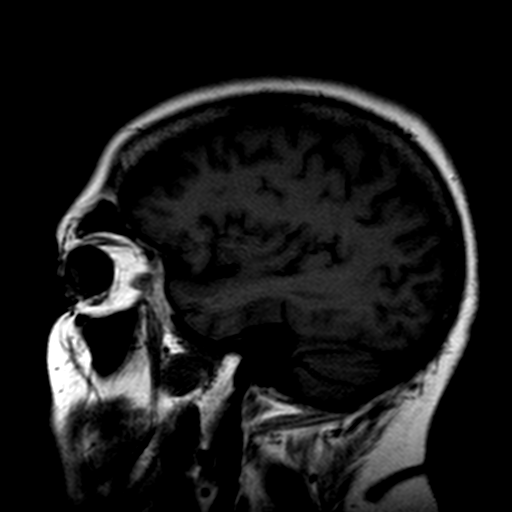
[im 10/19]
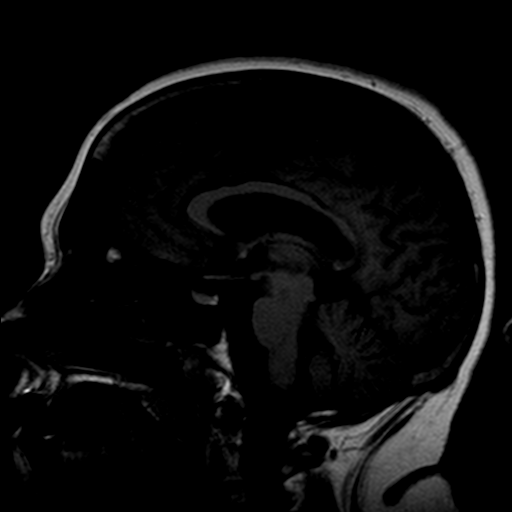
[im 14/19]
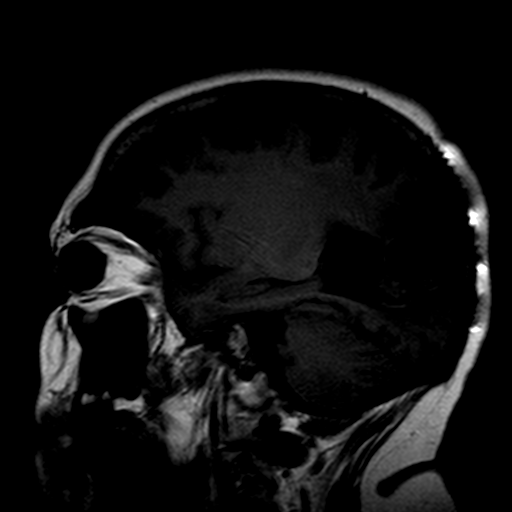
[im 19/19]
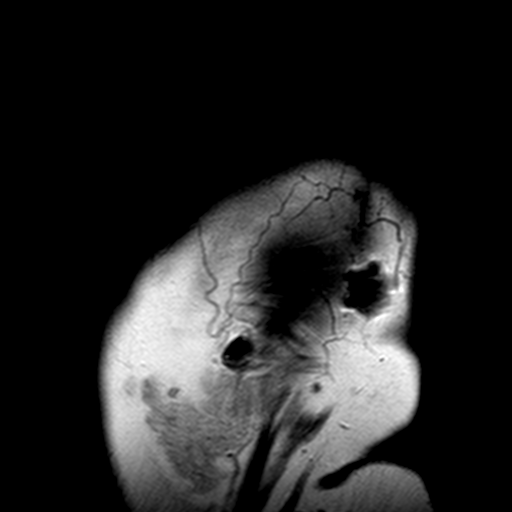

[Series 3: DWI · axial · 5.0mm · 1.80mm/px · z∈[-51,+98]mm · 8 of 55 slices shown (1 of 2)]
[im 1/55]
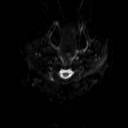
[im 9/55]
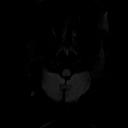
[im 17/55]
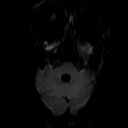
[im 25/55]
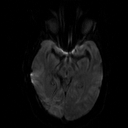
[im 30/55]
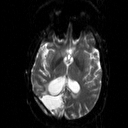
[im 38/55]
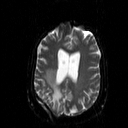
[im 46/55]
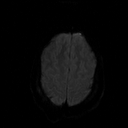
[im 55/55]
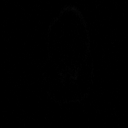

[Series 4: DWI · axial · 5.0mm · 1.80mm/px · z∈[-38,+105]mm · 4 of 14 slices shown (2 of 2)]
[im 1/14]
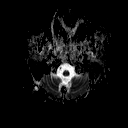
[im 5/14]
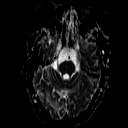
[im 9/14]
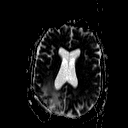
[im 14/14]
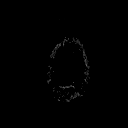

[Series 5: FLAIR · axial · 5.0mm · 0.45mm/px · 1 of 1 slices shown]
[im 1/1]
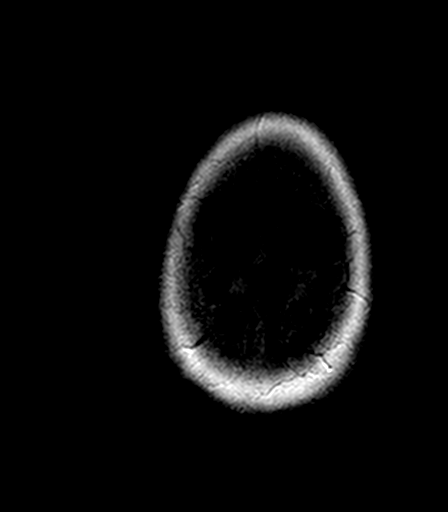

[Series 6: T2 · axial · 5.0mm · 0.72mm/px · z∈[-44,+105]mm · 5 of 20 slices shown]
[im 1/20]
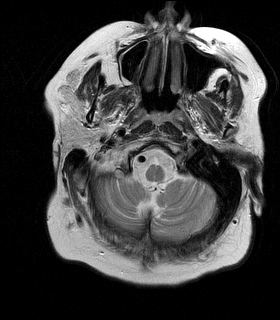
[im 5/20]
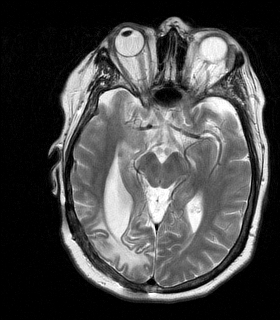
[im 10/20]
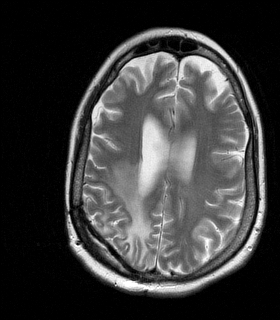
[im 15/20]
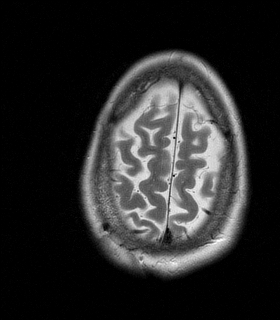
[im 20/20]
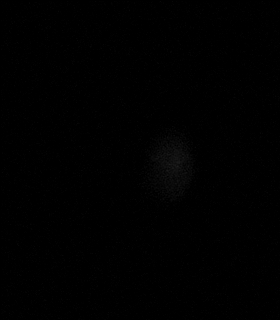

[Series 7: T1 · axial · 5.0mm · 0.45mm/px · z∈[-51,+98]mm · 3 of 13 slices shown (2 of 2)]
[im 1/13]
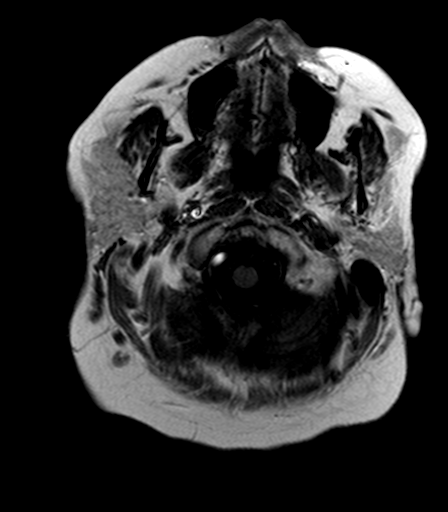
[im 7/13]
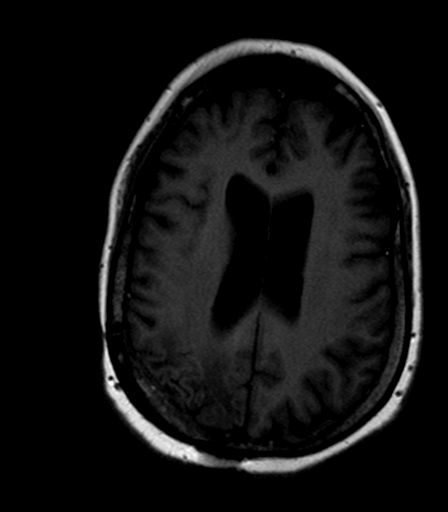
[im 13/13]
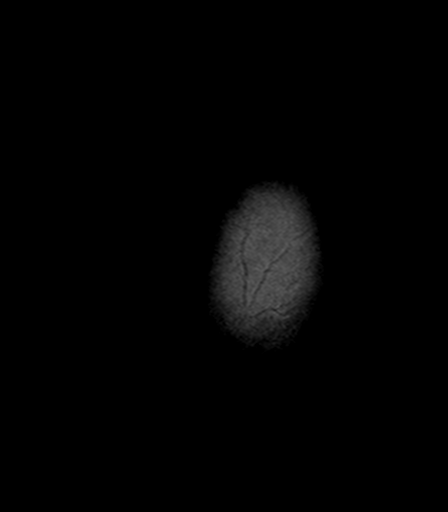

[Series 8: axial blood · axial · 5.0mm · 0.45mm/px · z∈[-57,+105]mm · 6 of 22 slices shown]
[im 1/22]
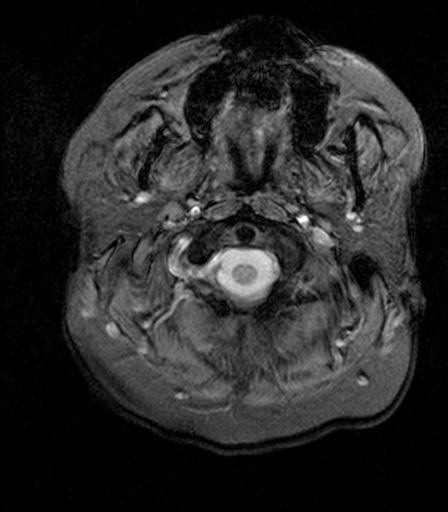
[im 5/22]
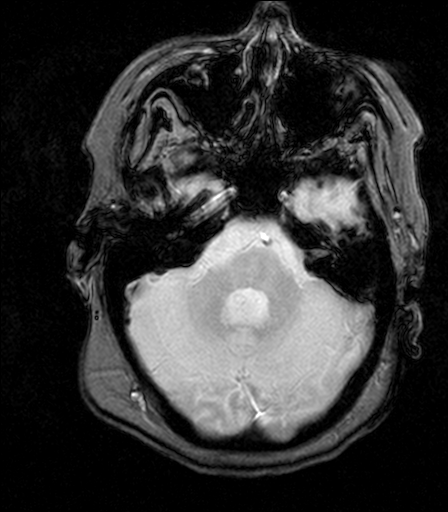
[im 9/22]
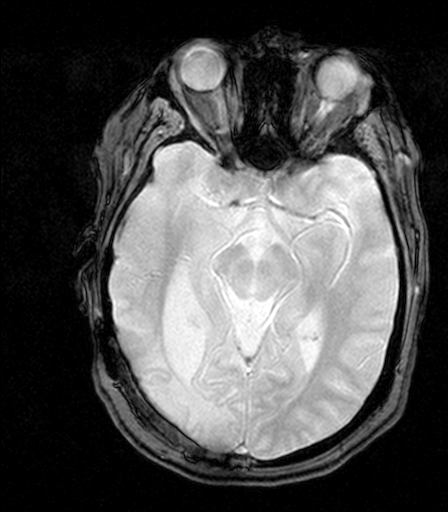
[im 13/22]
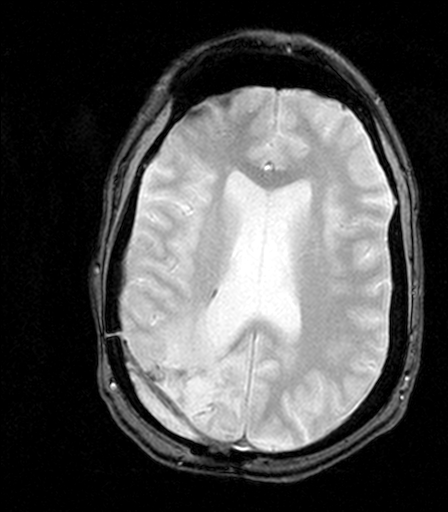
[im 17/22]
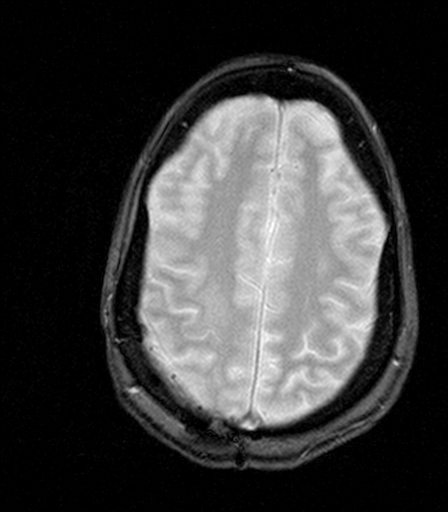
[im 22/22]
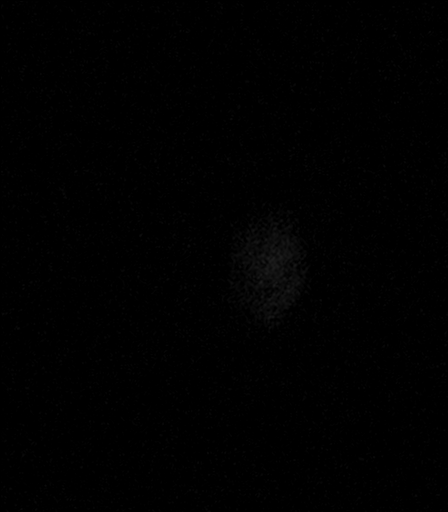

[Series 9: T1 fat-sat post-contrast · axial · 5.0mm · 0.90mm/px · z∈[-57,+92]mm · 5 of 19 slices shown (1 of 2)]
[im 1/19]
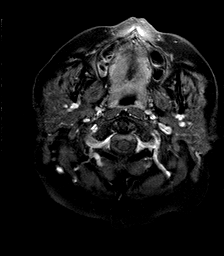
[im 5/19]
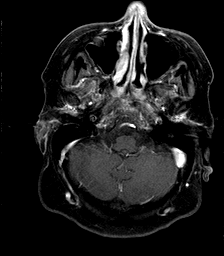
[im 10/19]
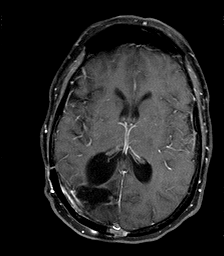
[im 14/19]
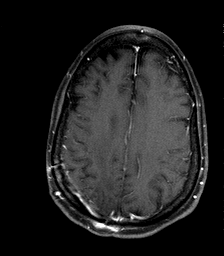
[im 19/19]
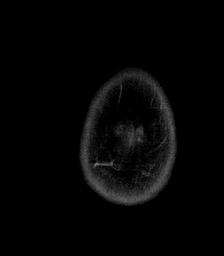

[Series 10: T1 fat-sat post-contrast · coronal · 5.0mm · 0.90mm/px · 5 of 21 slices shown (2 of 2)]
[im 1/21]
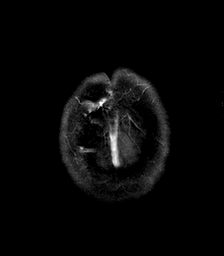
[im 6/21]
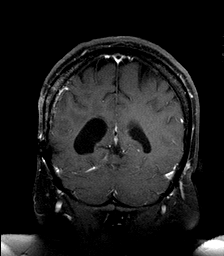
[im 11/21]
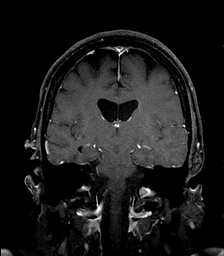
[im 16/21]
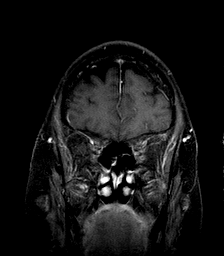
[im 21/21]
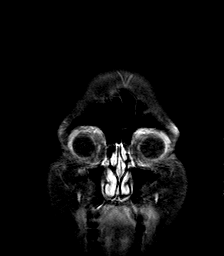

[42 of 48 positions shown; findings below may reference images not displayed]

EXAM

MR head/brain wo/w con

INDICATION

Glioblastoma follow-up

TECHNIQUE

Multiplanar, multisequence imaging of the brain was performed with and without contrast. 15
milliliters of Gadavist contrast.

COMPARISONS

CT of the head 10/22/2020

FINDINGS

Parenchyma: No diffusion restriction, acute hemorrhage, midline shift, or herniation. Large area of
encephalomalacia compatible with prior resection in the right parietal occipital lobe with
surrounding gliosis. T2/FLAIR hyperintensity significantly extending anteriorly along the right
posterior centrum semiovale to the level of the mid right lateral ventricle. Inferior extension of
gliosis into the subcortical white matter of the right occipital lobe. Trace enhancement along the
resection margin with a focal area nodularity along the medial resection cavity (series 9, image
14), likely measuring up to 3 millimeters which may be related to vascularity. Imaging finding is
similar to prior examination. Pulsation artifact.

Ventricles: No hydrocephalus.

Extra-axial spaces: Unchanged 6 cm extra-axial fluid collection deep to the craniotomy without
significant underlying mass effect (series 8, image 12). Dural enhancement related to prior surgical
approach over the right parietal convexity.

Flow voids: Intact.

Other: Bony calvarium is intact. The visualized paranasal sinuses and mastoid air cells are
essentially clear.

IMPRESSION
1. Overall unchanged appearance of postsurgical marginal enhancement along the resection cavity
with a focal area of unchanged nodular enhancement at the medial margin.
2. Unchanged extra-axial fluid collection deep to the craniotomy.

Tech Notes:

FOLLOW UP GLIOBLASTOMA, 15ML GADAVIST

## 2021-02-27 ENCOUNTER — Encounter: Admit: 2021-02-27 | Discharge: 2021-02-27 | Payer: PRIVATE HEALTH INSURANCE

## 2021-02-28 ENCOUNTER — Encounter: Admit: 2021-02-28 | Discharge: 2021-02-28 | Payer: PRIVATE HEALTH INSURANCE

## 2021-02-28 MED ORDER — LAMOTRIGINE 100 MG PO TAB
ORAL_TABLET | Freq: Two times a day (BID) | 5 refills | Status: AC
Start: 2021-02-28 — End: ?

## 2021-02-28 MED ORDER — LAMOTRIGINE 25 MG PO TAB
ORAL_TABLET | Freq: Two times a day (BID) | 5 refills | Status: AC
Start: 2021-02-28 — End: ?

## 2021-03-15 IMAGING — CT SPCERVWO
3 of 4 series · 14 of 47 positions shown, 16 images · non-contrast
Comparison: none

[Series 2: c-spine ax 2.00 br60 s3 · axial · 0.27mm/px · z∈[-687,-530]mm · 8 of 74 slices shown, 10 images]
[im 5/74  brain]
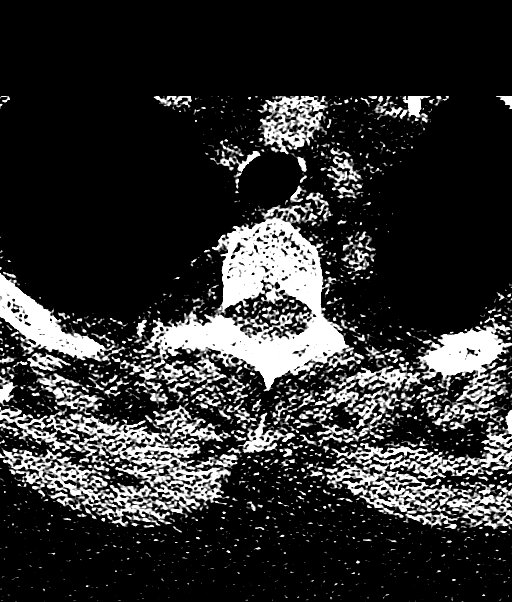
[im 5/74  bone]
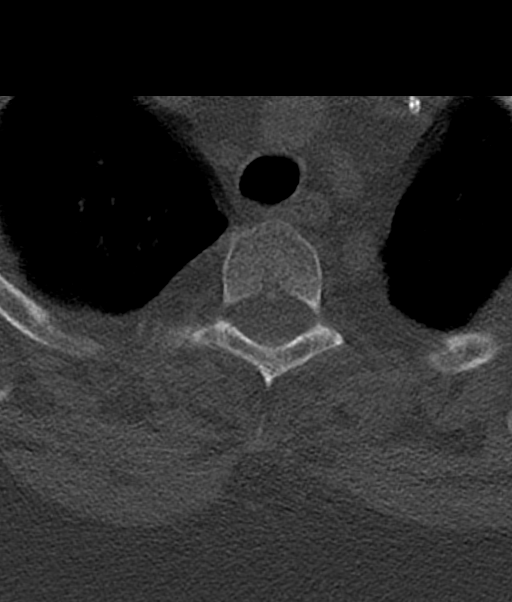
[im 14/74  brain]
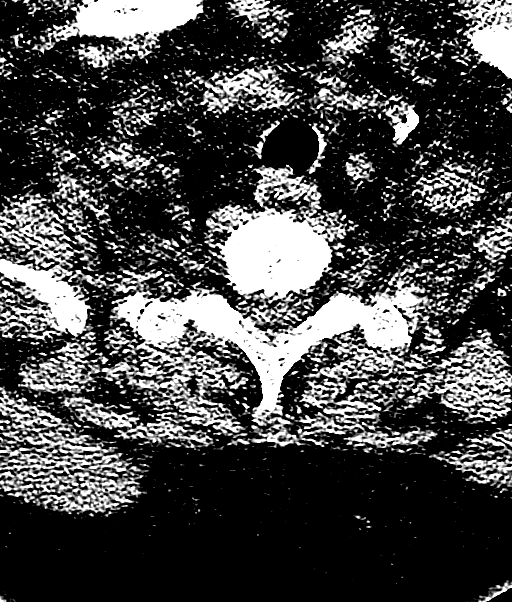
[im 23/74  brain]
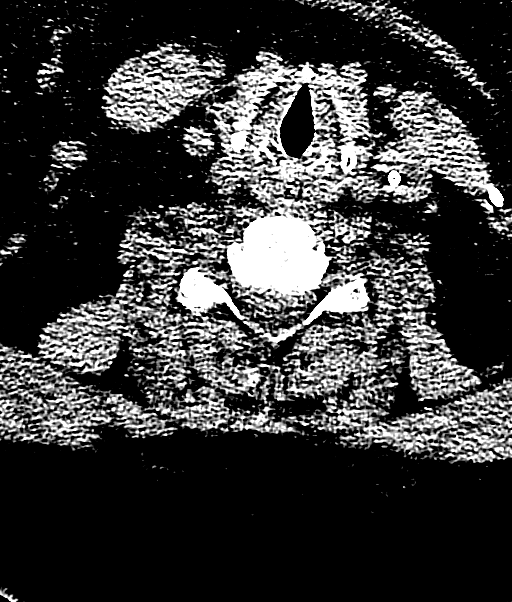
[im 32/74  brain]
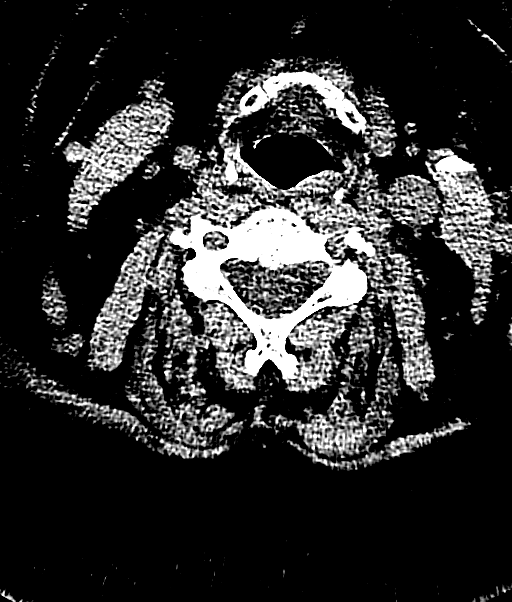
[im 42/74  brain]
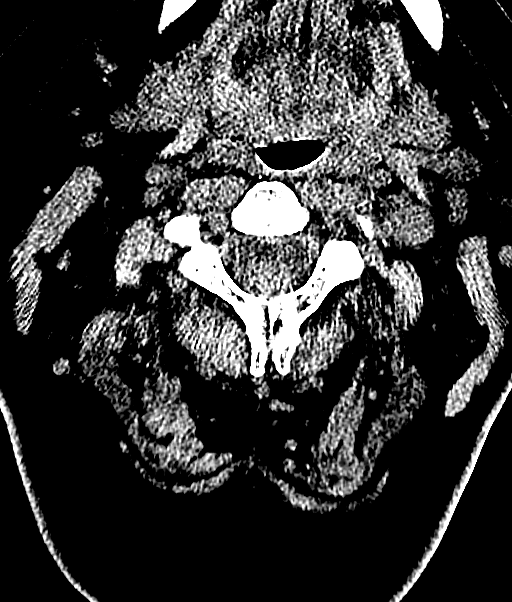
[im 42/74  bone]
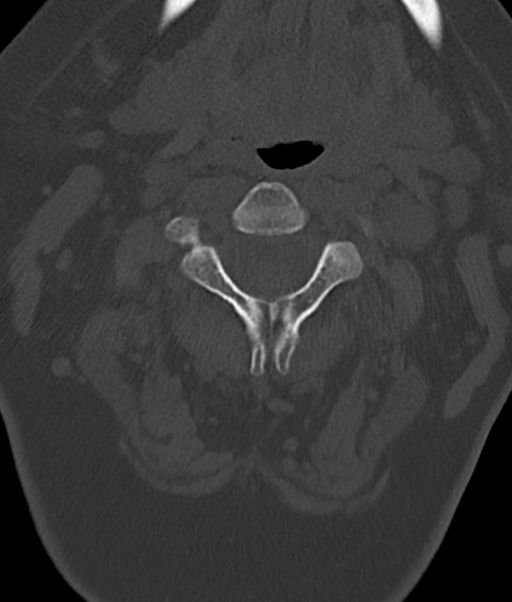
[im 51/74  brain]
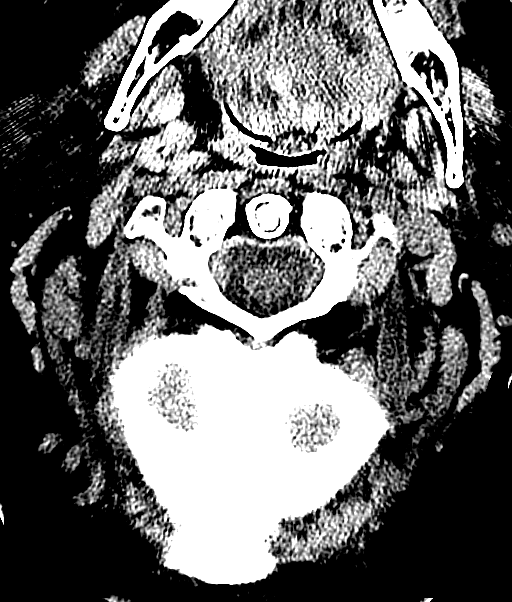
[im 60/74  brain]
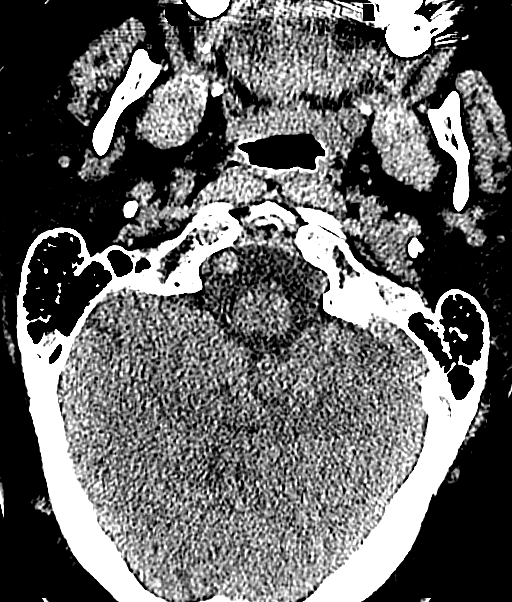
[im 69/74  brain]
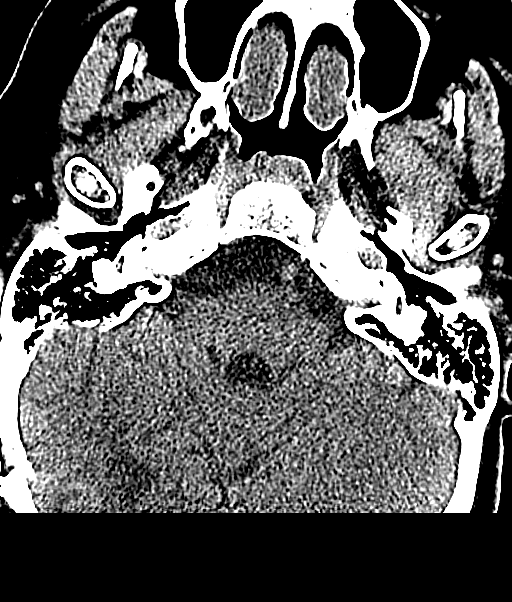

[Series 4: c-spine cor 2.00 br60 s3 · coronal · 0.27mm/px · 3 of 50 slices shown]
[im 17/50  brain]
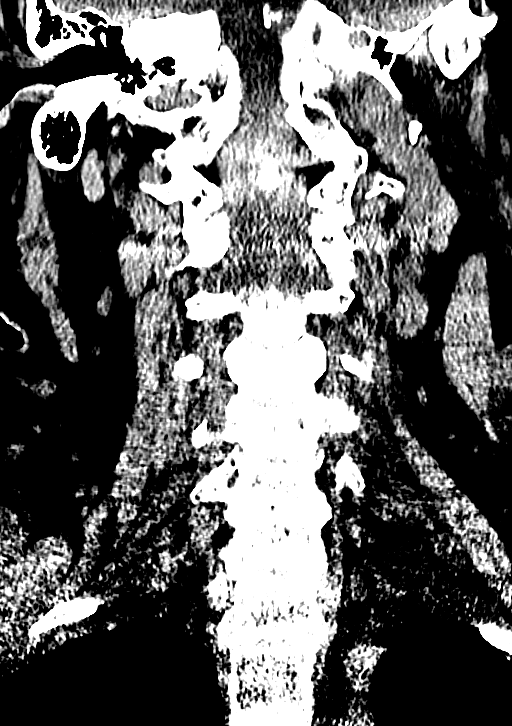
[im 22/50  brain]
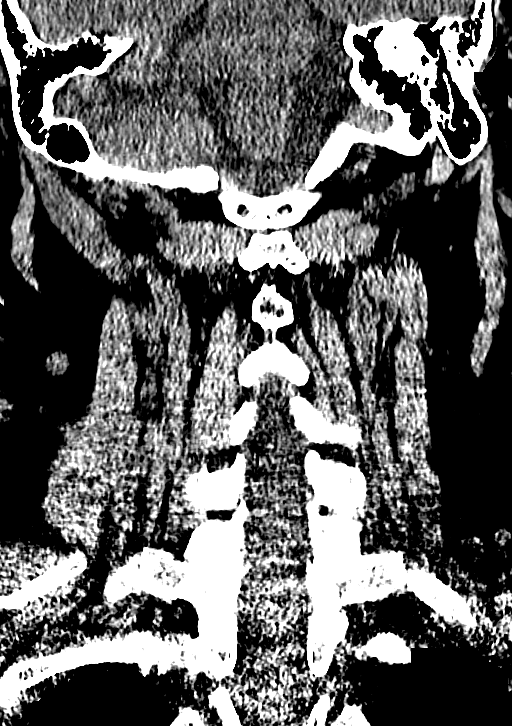
[im 28/50  brain]
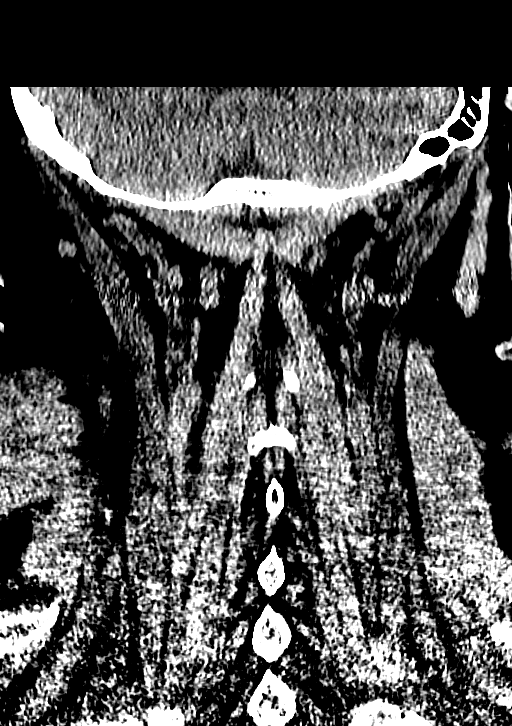

[Series 6: c-spine sag 2.00 br60 s3 · sagittal · 0.31mm/px · 3 of 49 slices shown]
[im 17/49  brain]
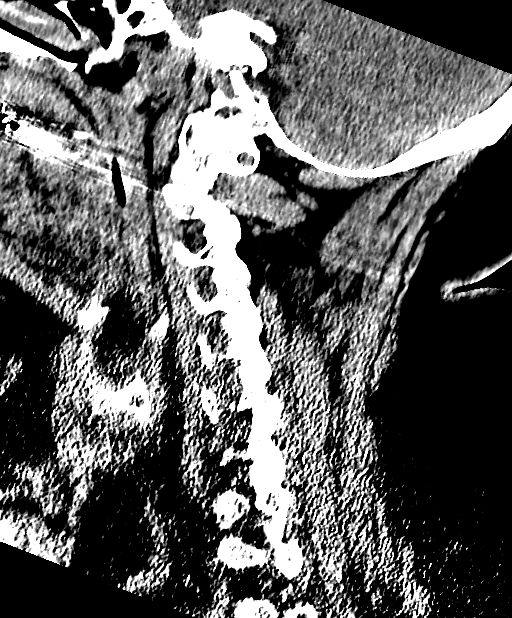
[im 25/49  brain]
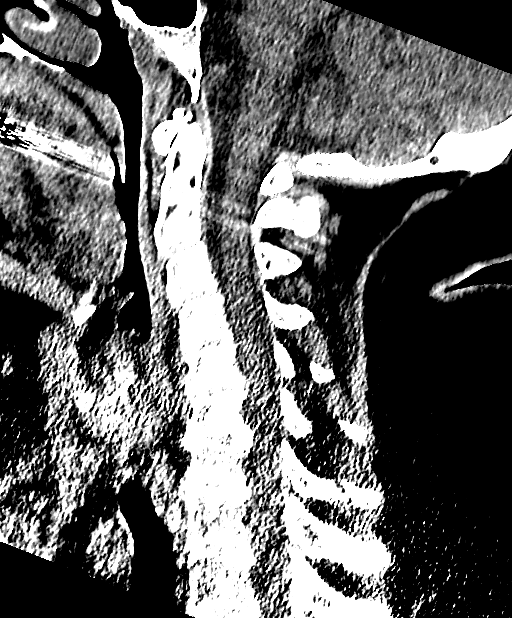
[im 33/49  brain]
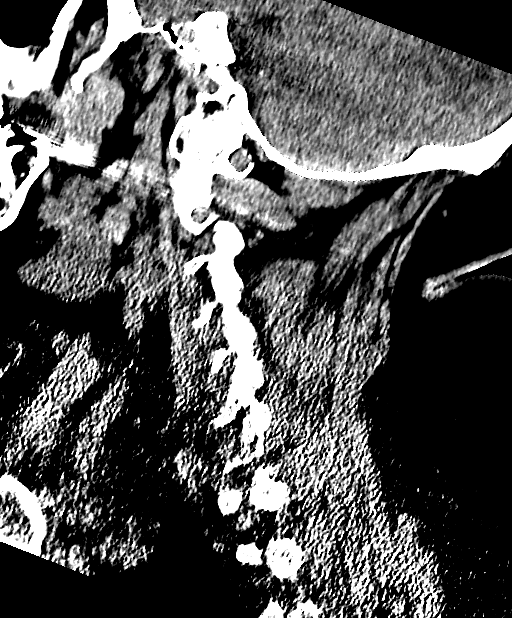

[14 of 47 positions shown; findings below may reference images not displayed]

DIAGNOSTIC STUDIES

EXAM

INDICATION

fall pain
Fall x lastnight. Hit head. Pt on blood thinners. H/o glioblastoma. CT/NM 3/0. TM/CF

TECHNIQUE

All CT scans at this facility use dose modulation, iterative reconstruction, and/or weight based
dosing when appropriate to reduce radiation dose to as low as reasonably achievable.

Number of previous computed tomography exams in the last 12 months is 0  .

Number of previous nuclear medicine myocardial perfusion studies in the last 12 months is 0  .

COMPARISONS

FINDINGS

Mild reversal of the normal cervical lordosis which may be related to muscle spasm. Normal
cervical vertebral body heights. Multilevel mild intervertebral disc space narrowing. The facet
joints and lateral masses are normally aligned. No prevertebral soft tissue swelling.

IMPRESSION

No acute osseous abnormality

Tech Notes:

Fall x lastnight. Hit head. Pt on blood thinners. H/o glioblastoma. CT/NM 3/0. TM/CF

## 2021-03-15 IMAGING — CT HEADWO
3 of 4 series · 14 of 47 positions shown, 16 images · non-contrast
Comparison: none

[Series 4: brain cor 5.00 hr40 s3 · coronal · 0.33mm/px · 3 of 11 slices shown]
[im 4/11  brain]
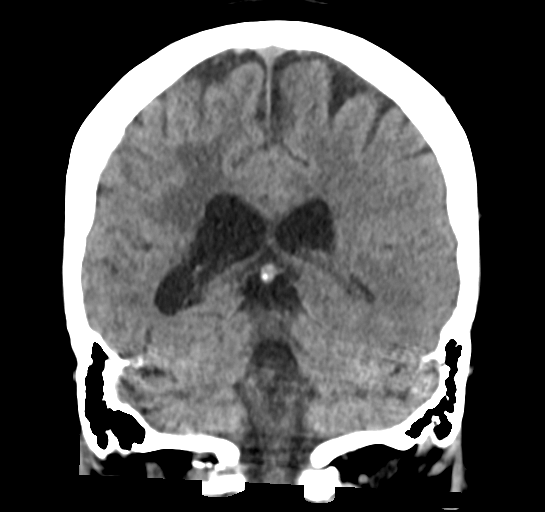
[im 5/11  brain]
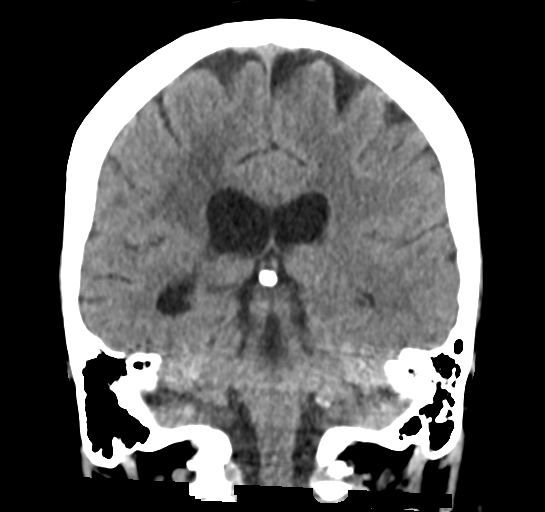
[im 6/11  brain]
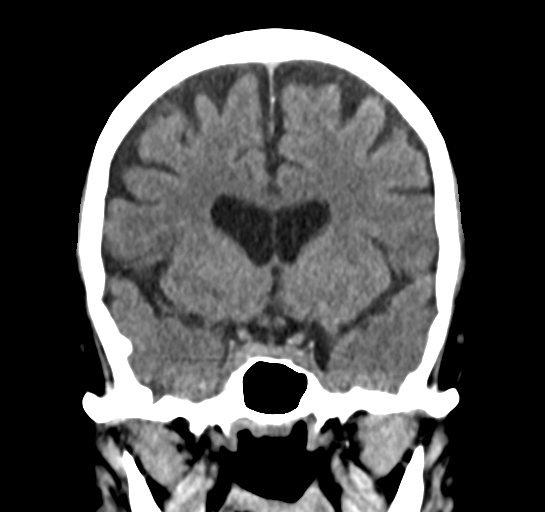

[Series 6: brain sag 5.00 hr40 s3 · sagittal · 0.33mm/px · 3 of 21 slices shown]
[im 7/21  brain]
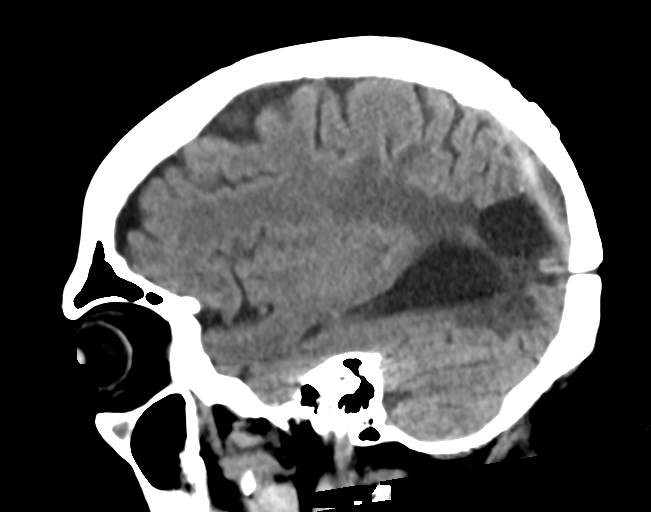
[im 11/21  brain]
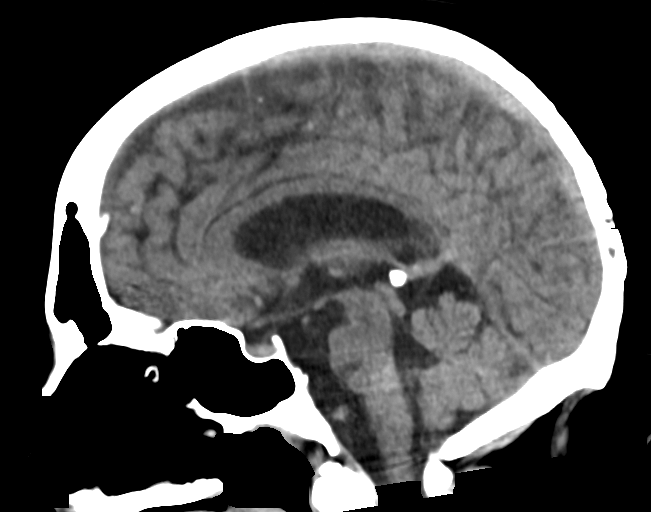
[im 14/21  brain]
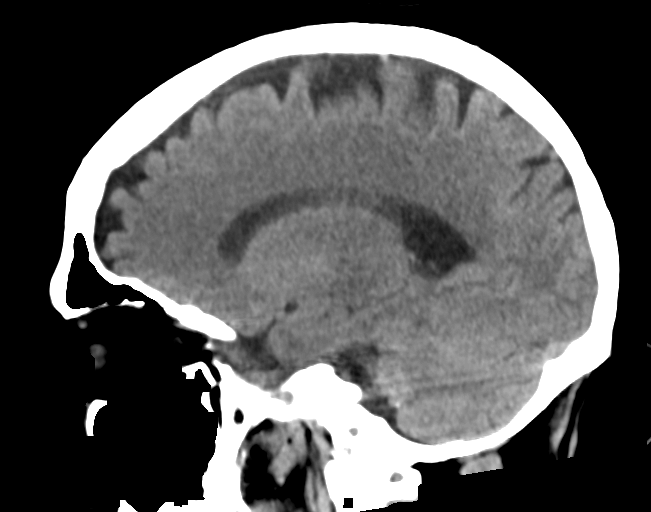

[Series 8: brain ax 2.00 hr60 s3 · axial · 0.36mm/px · z∈[-511,-371]mm · 8 of 52 slices shown, 10 images]
[im 4/52  brain]
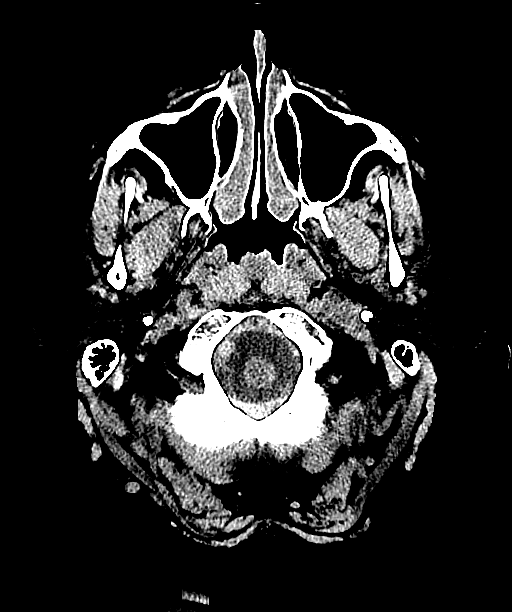
[im 4/52  bone]
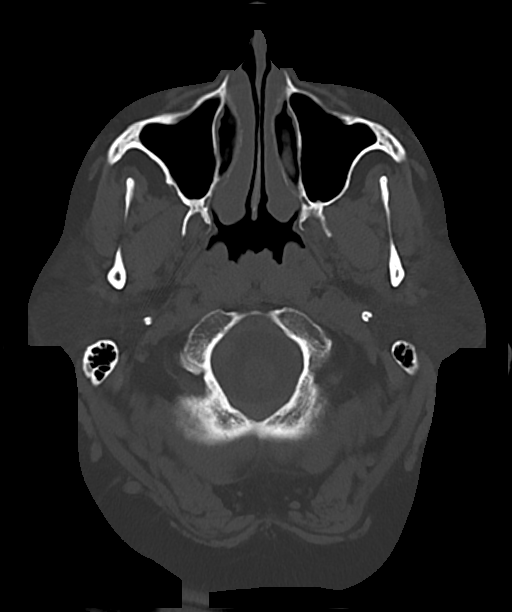
[im 10/52  brain]
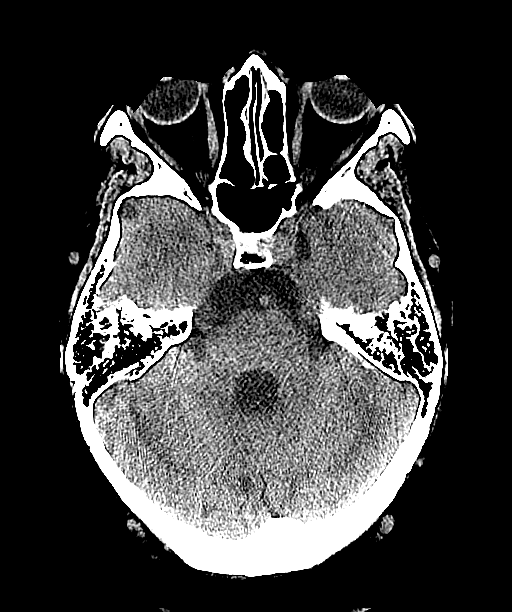
[im 18/52  brain]
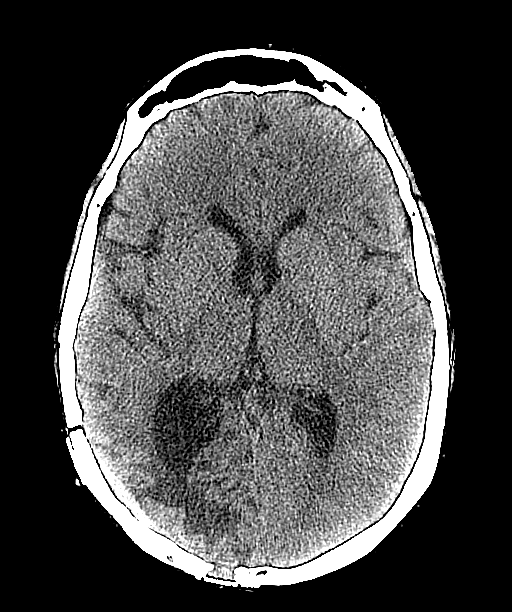
[im 23/52  brain]
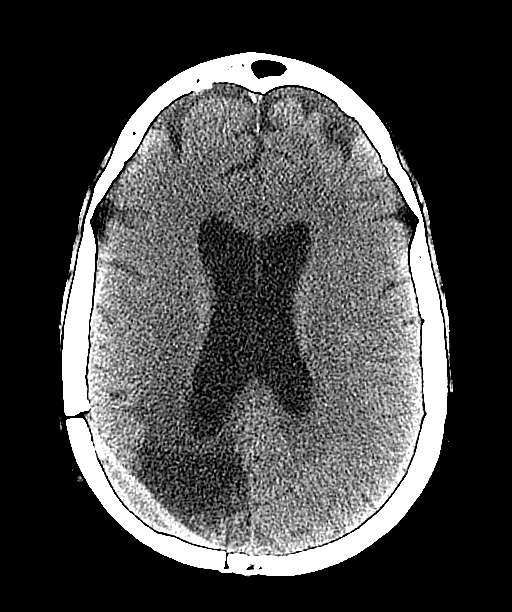
[im 29/52  brain]
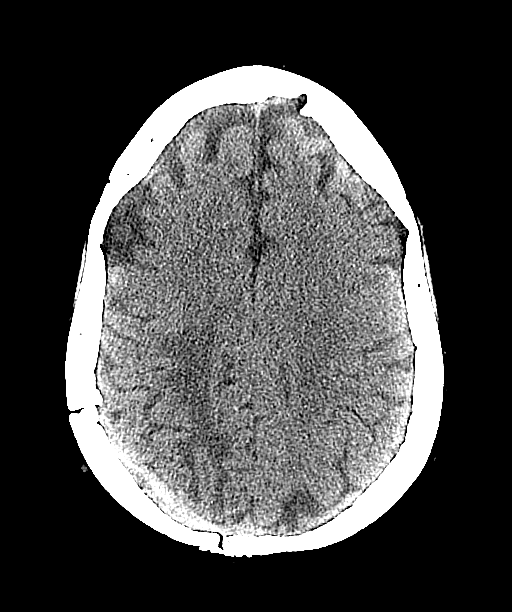
[im 29/52  bone]
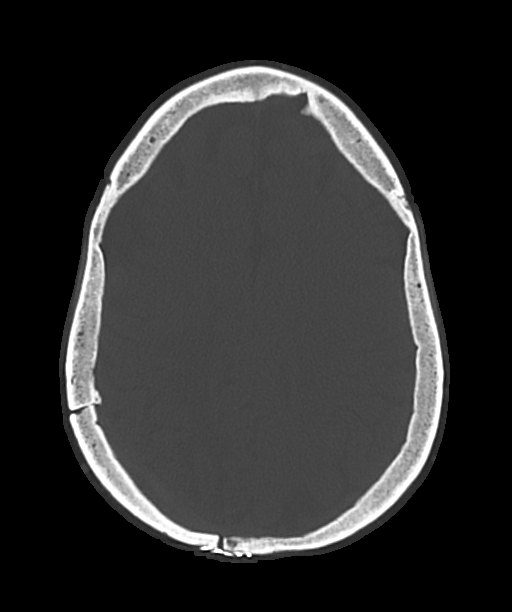
[im 35/52  brain]
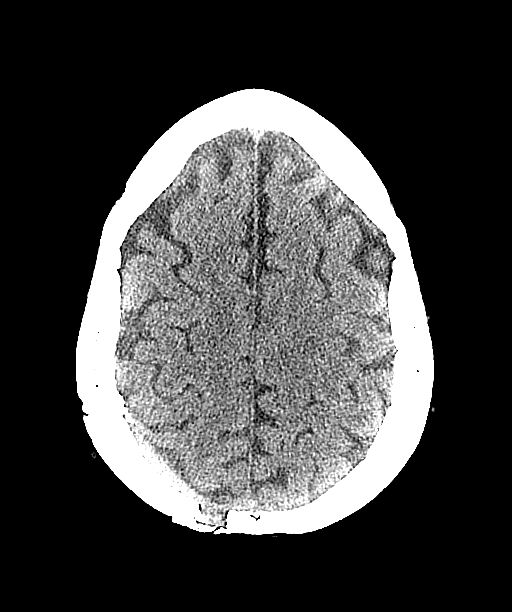
[im 42/52  brain]
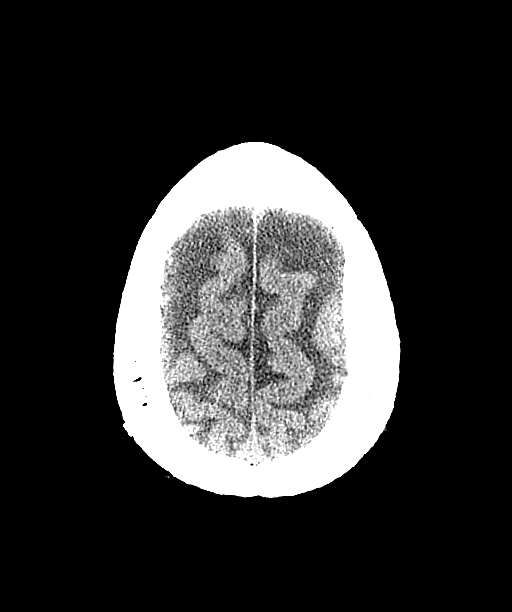
[im 48/52  brain]
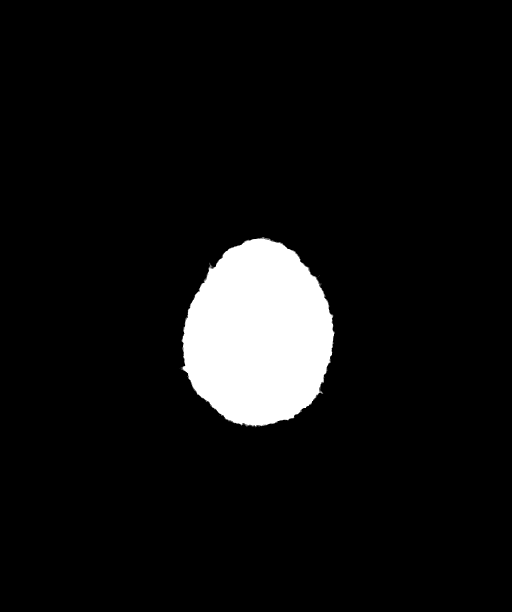

[14 of 47 positions shown; findings below may reference images not displayed]

DIAGNOSTIC STUDIES

EXAM

INDICATION

malignancy on blood thinner fell painR occiput
Fall x lastnight. Hit head. Pt on blood thinners. H/o glioblastoma. CT/NM 3/0. TM/CF

TECHNIQUE

All CT scans at this facility use dose modulation, iterative reconstruction, and/or weight based
dosing when appropriate to reduce radiation dose to as low as reasonably achievable.

Number of previous computed tomography exams in the last 12 months is 0  .

Number of previous nuclear medicine myocardial perfusion studies in the last 12 months is 0  .

COMPARISONS

10/22/2020

FINDINGS

Stable post surgical changes with right posterior craniotomy and right occipital encephalomalacia.
No acute intra-axial or extra-axial mass or collection. No acute hemorrhage. The basilar cisterns
are patent. The ventricles are normal in size. Normal gray-white matter differentiation. The
paranasal sinuses and mastoid air cells are clear. The visualized orbits and globes are within
normal limits

IMPRESSION

No acute intracranial process

Tech Notes:

Fall x lastnight. Hit head. Pt on blood thinners. H/o glioblastoma. CT/NM 3/0. TM/CF

## 2021-04-25 ENCOUNTER — Encounter: Admit: 2021-04-25 | Discharge: 2021-04-25 | Payer: MEDICARE

## 2021-04-25 ENCOUNTER — Ambulatory Visit: Admit: 2021-04-25 | Discharge: 2021-04-26 | Payer: MEDICARE

## 2021-04-25 ENCOUNTER — Encounter: Admit: 2021-04-25 | Discharge: 2021-04-25 | Payer: PRIVATE HEALTH INSURANCE

## 2021-04-25 DIAGNOSIS — E079 Disorder of thyroid, unspecified: Secondary | ICD-10-CM

## 2021-04-25 DIAGNOSIS — F909 Attention-deficit hyperactivity disorder, unspecified type: Secondary | ICD-10-CM

## 2021-04-25 DIAGNOSIS — F32A Depression: Secondary | ICD-10-CM

## 2021-04-25 DIAGNOSIS — C719 Malignant neoplasm of brain, unspecified: Secondary | ICD-10-CM

## 2021-04-25 DIAGNOSIS — I10 Essential (primary) hypertension: Secondary | ICD-10-CM

## 2021-04-25 NOTE — Telephone Encounter
CALLED PT AND GOT HER SCHEDULED FOR HER RT WITH [facility] REQUESTED 7/13 IN THE LATE MORNING

## 2021-04-25 NOTE — Patient Instructions
Planned Diagnostic Testing:  None new at this appointment    Medications:   Please continue zonisamide 400mg  at night and lamotrigine 125mg  twice daily    Other Instructions:   Will touch base with your oncology team, palliative care team, and Dr. Isidore Moos to consolidate your care    Return to Clinic:  3 months with me

## 2021-04-25 NOTE — Telephone Encounter
-----   Message from Donnal Debar, RN sent at 04/25/2021  1:52 PM CDT -----  Regarding: Fu with Charise Killian  Please call pt to schedule FU to review MRI. If need to schedule ok for MOB just make sure tuncer has other patients on that day and try to stack appointments close together if possible please put patients on Mondays at [facility] first prior to Wednesdays. Dr Charise Killian prefers pts in mornings Charise Killian does #not# schedule neuro on Friday!).

## 2021-05-01 ENCOUNTER — Encounter: Admit: 2021-05-01 | Discharge: 2021-05-01 | Payer: MEDICARE

## 2021-05-02 ENCOUNTER — Encounter: Admit: 2021-05-02 | Discharge: 2021-05-02 | Payer: MEDICARE

## 2021-05-03 NOTE — Progress Notes
[facility]  Brain and Spine Tumor Program        Neuro-oncology Clinic Progress Note    Patient Name: Andrea [name]  DOB: 1971-04-16  MRN: [id number]  DOS: 05/06/2021      Subjective:   Andrea [name] is a 50 y.o. 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    Reason For Visit:  Neuro-Oncology Care establishment    Treatment History:  08/26/18: Presented to the ED after becoming lost while driving to her daughter's house which is 3 minutes from her home. She reports a headache for about 2 weeks which became gradually worse and she attributed to sinus problems. She states she has felt foggy lately, fatigued and her memory has been poor. Her family reports she has been running into things around the house, she drove on the wrong side of the road, forgot to go to work, and has been jumbling her words and is confused.  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 A CT head showed a large right posterior cerebrum lesion and she was transferred to [facility] for a higher level of care.  ?  08/26/18: CT Head - Large masslike process throughout the right posterior cerebrum with probably associated hemorrhage. Vasogenic edema with diffuse sulcal effacement and subfalcine herniation. ?Suggestive diffuse thickening and increased attenuation throughout the right cerebral cortex/inter up. ?  ?  08/26/18: MRI Head - ?Large cerebral necrotic enhancing mass centered within the right posterior cerebral hemisphere, favored for high-grade glioma. Cystic metastasis is considered less likely. Localized mass effect results in right hemispheric sulcal effacement, 7 mm leftward shift, and early right uncal herniation. Moderate FLAIR hyperintensity surrounding the mass and involving the bilateral rostral callosum, which may represent a combination of vasogenic edema and/or tumor elements.  ?  08/26/18: CT CAP - CHEST: No discrete primary malignancy or thoracic metastatic disease. At least mild coronary artery calcification. ABDOMEN AND PELVIS: No discrete primary malignancy or abdominopelvic metastatic disease. Probable left ovarian cyst, incompletely characterized. Small fat containing supraumbilical hernia.  ?  08/27/18: Right craniotomy -  Path: High grade glioma with necrosis, mitoses and mvp.IDH1 negative by ihc, MGMT pending  ?  08/28/18: MRI Head - Interval right parietal craniotomy and mass resection, with expected hemorrhage within the operative cavity. There is mild postoperative pneumocephalus and a relatively small amount of postoperative gas, fluid, and blood products deep to the craniotomy. Residual peripheral enhancement along the anterior inferior origin of the resection bed, most compatible with residual tumor   Improvement in right cerebral mass effect with improving leftward shift and sulcal effacement, with resolved or nearly uncal herniation. No significant change in FLAIR signal abnormality within the right posterior cerebral hemisphere and along the bilateral rostral callosum  ?  08/31/18: Admitted to inpatient rehab at [facility] with tentative DC date 09/15/18    She has been most recently seen by our [facility], PA-C back in 05/07/2020.  At that point, MRI was noted to be unchanged and she was called back for RTC in 8 weeks with a repeat MRI.  Apparently, the patient did not follow through.        Most recent MRI from [facility] on 02/20/2021 revealed overall unchanged appearance of postsurgical marginal enhancement along the resection cavity with a focal area of unchanged nodular enhancement at the medial margin.    Interval History:  Andrea [name] continues to do clinically stable.Marland Kitchen Her fatigue continues although somewhat improved. 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       Past Medical History:     Medical History:   Diagnosis Date   ? ADHD    ? Depression    ? Disorder of thyroid gland    ? Hypertension      Surgical History:   Procedure Laterality Date   ? Right craniotomy for resection of tumor Right 08/27/2018    Performed by Theotis Barrio, MD at [facility] OR   ? MICROSURGICAL TECHNIQUES - REQUIRING OPERATING MICROSCOPE USE N/A 08/27/2018    Performed by Theotis Barrio, MD at [facility] OR   ? STEREOTACTIC COMPUTER-ASSISTED CRANIAL PROCEDURE - INTRADURAL N/A 08/27/2018    Performed by Theotis Barrio, MD at [facility] OR   ? ULTRASOUND GUIDANCE - INTRAOPERATIVE  08/27/2018    Performed by Theotis Barrio, MD at [facility] OR   ? CRANIECTOMY/ BONE FLAP CRANIOTOMY EXCISION SUPRATENTORIAL BRAIN TUMOR Right 08/28/2018    Performed by Theotis Barrio, MD at [facility] OR   ? CHOLECYSTECTOMY     ? HX APPENDECTOMY     ? HX HYSTERECTOMY     ? HX JOINT REPLACEMENT     ? HYSTERECTOMY       Social History     Socioeconomic History   ? Marital status: Widowed   ? Number of children: 1   Tobacco Use   ? Smoking status: Current Every Day Smoker     Packs/day: 0.25     Years: 30.00     Pack years: 7.50     Types: Cigarettes   ? Smokeless tobacco: Never Used   Vaping Use   ? Vaping Use: Never used   Substance and Sexual Activity   ? Alcohol use: Yes     Comment: Seldom   ? Drug use: Never       Current Medications:  ? acetaminophen (TYLENOL) 325 mg tablet Take two tablets by mouth every 6 hours as needed for Pain.   ? apixaban (ELIQUIS) 5 mg tablet Take one tablet by mouth twice daily.   ? calcium carbonate (TUMS) 500 mg (200 mg elemental calcium) chewable tablet Chew 500 mg by mouth as Needed.   ? celecoxib (CELEBREX) 200 mg capsule Take one capsule by mouth twice daily.   ? duloxetine DR (CYMBALTA) 60 mg capsule Take one capsule by mouth daily.   ? escitalopram oxalate (LEXAPRO) 20 mg tablet Take 20 mg by mouth daily. Added per 11/22/19 records from [facility]   ? granisetron (KYTRIL) 1 mg tablet Take one tablet by mouth every 12 hours as needed for Nausea.   ? lamoTRIgine (LAMICTAL) 100 mg tablet Take one tablet twice a day with 25mg  tabs for a total of 125mg .   ? lamoTRIgine (LAMICTAL) 25 mg tablet Take one tablet twice a day with 100mg  tabs for a total of 125mg .   ? levothyroxine (SYNTHROID) 150 mcg tablet Take 150 mcg by mouth daily 30 minutes before breakfast.   ? lidocaine/prilocaine (EMLA) 2.5/2.5 % topical cream Apply 1 g topically to affected area as Needed.   ? LORazepam (ATIVAN) 2 mg tablet Take one tablet by mouth every 6 hours as needed for Nausea, Vomiting or Other... (Max 4mg  every 6 hours as needed) for up to 7 doses.   ? losartan-hydrochlorothiazide (HYZAAR) 100-12.5 mg tablet  Take 1 tablet by mouth every morning. Patient take 1/2 tablet daily.   ? oxyCODONE-acetaminophen (PERCOCET) 7.5-325 mg tablet TAKE 1 TABLET BY MOUTH TWICE DAILY AS NEEDED FOR PAIN   ? pe/hydrocod/acetaminophen/cpm (HYDROCOD-CPM-PE-ACETAMINOPHEN PO) Take 1-2 tablets by mouth every 4-6 hours as needed.   ? pregabalin (LYRICA) 75 mg capsule Take 75 mg by mouth twice daily.   ? propranolol LA (INDERAL LA) 80 mg capsule Take 80 mg by mouth daily. Added per 03/16/20 records from [facility]   ? vitamins, multiple cap Take 1 capsule by mouth daily.   ? zonisamide (ZONEGRAN) 100 mg capsule TAKE 4 CAPSULES BY MOUTH EVERY DAY AT BEDTIME       Allergies:  Allergies   Allergen Reactions   ? Morphine ANAPHYLAXIS   ? Codeine RASH   ? Erythromycin SHORTNESS OF BREATH   ? Latex RASH   ? Keppra [Levetiracetam] AGITATION   ? Nsaids (Non-Steroidal Anti-Inflammatory Drug) SEE COMMENTS     History of GI bleed - has been instructed to avoid NSAID's       Vitals:  Vitals:    05/06/21 1041   BP: 135/73   Pulse: 63   SpO2: 100%   Weight: 127 kg (280 lb)   Height: 167.6 cm (5' 6)     Body surface area is 2.43 meters squared.    Exam       Domains    Gait 1 - Abnormal but walks without assistance    Strength 1- Movement present but decreased against resistance   Ataxia (upper extremities) 1- Able to finger to nose touch without difficulty   Sensation 0- Normal   Visual fields 1- Inconsistent or equivocal partial hemianopsia (>or= quadrantanopsia)   Facial strength 0- Normal   Language 2- Abnormal and difficulty conveying meaning to examiner   Level of consciousness 0- Normal   Behavior 0- Normal     TOTAL NANO SCORE: 6    ECOG PS = 2    Pain = 5/10    Well nourished, well developed, in no acute distress, sequelae of steroid use noted with Cushingoid appearance.    Mood and affect appear appropriate for the situation  Cognition appears slow, expressive aphasia  Somewhat Ambulatory at home    CN grossly intact 2-12 with the exception of dense hemianopsia  Strength appears intact in all 4 extremities  Balance (ambulation) is wide-based      Pathological data:    Procedures/Addenda   Addendum ? ? Date Ordered: ? ? 09/21/2018 ? ? Status: ?Signed Out   Date Complete: ? ? 09/21/2018   By: Jonna Clark, MD   Date Reported: ? ? 09/22/2018 ? ? ? ?   Addendum Diagnosis   Final integrated molecular diagnosis:   A. Brain, right tumor, right craniotomy: ?   Glioblastoma, IDH-wildtype, WHO grade IV   See comment. ?     B. Brain, right brain tumor, right craniotomy: ?   Glioblastoma, IDH-wildtype, WHO grade IV   See comment. ?       Addendum Comment   At the request of Dr. Jonna Clark, the specimen is evaluated for testing   of IDH1 and IDH2 mutations. Tissue sections from block B4 were submitted   to [facility] for analysis, and the results   are listed below. (Please refer to O2 for a scanned image of the original   report).     Test Performed   IDH1 Variant Analysis by Next Generation Sequencing (NGS)  Specimen Type   FFPE (75.0 % tumor cellularity)     Analytical Results   NOT DETECTED. None of the IDH1 variants screened by this assay were   detected. See interpretation.     Interpretation   Not Detected. No IDH1 gene variants were detected with this assay. A NOT   DETECTED result does not rule out the presence of an IDH1 gene variant   beyond the limits of this technology or not included in the gene regions   covered by this assay. The presence of a IDH1 gene variant may be a   predictor of response to some cancer therapies.   Test Performed   IDH2 Variant Analysis by Next Generation Sequencing (NGS)     Specimen Type   FFPE (75.0 % tumor cellularity)     Analytical Results   NOT DETECTED. None of the IDH2 variants screened by this assay were   detected. See interpretation.     Interpretation   Not Detected. No IDH2 gene variants were detected with this assay. A NOT   DETECTED result does not rule out the presence of an IDH2 gene variant   beyond the limits of this technology or not included in the gene regions   covered by this assay. The presence of a IDH2 gene variant may be a   predictor of response to some cancer therapies.    RADIOLOGICAL DATA:     Most recent MRI from [facility] on 02/20/2021 revealed overall unchanged appearance of postsurgical marginal enhancement along the resection cavity with a focal area of unchanged nodular enhancement at the medial margin.      IMPRESSION/RECOMMENDATION:      Patient Active Problem List    Diagnosis Date Noted   ? Partial symptomatic epilepsy with complex partial seizures, not intractable, without status epilepticus (HCC) 12/30/2019   ? 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 Glioblastoma (HCC) 09/13/2018   ? Impaired mobility and ADLs 08/31/2018   ? Impaired cognition 08/31/2018   ? HTN (hypertension) 08/31/2018   ? Hypothyroid 08/31/2018   ? 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  Coronavirus is limiting activity.  Dense left hemianopsia persists.  Her brief treatment history includes presentation with a few weeks of progressive confusion, bumping into things.  Imaging demonstrates a right inferior parietal enhancing mass with surrounding edema.  She underwent radiation with concurrent temozolomide (no depakote) ending in early January 2020.  Began adjuvant temolomide, adjusted to BID dosed due to her comorbidities.  She has completed her 12 cycles of twice daily adjusted adjuvant p.o. temozolomide back in May 2021.  She is now 16 months out.  I do not see an updated MRI at this time.    -CBC/CMP/TSH today  -Interval MRI ordered  -If clinically and radiographically stable, next MRI should be 4 months from the last ( 02/20/21) , ordered for late August/beginning of September.  -Seizure disorder - controlled-, follow.      The case has been extensively discussed with the patient who verbalized understanding, questions were answered and a total of 35 minutes was spent  on this day discussing and preparing about the diagnosis,prognosis, treatment options and toxicity profile as well as long-term outcomes.     Gita Kudo, MD   Neuro-Oncology/Medical Oncology

## 2021-05-04 ENCOUNTER — Encounter: Admit: 2021-05-04 | Discharge: 2021-05-04 | Payer: MEDICARE

## 2021-05-06 ENCOUNTER — Encounter: Admit: 2021-05-06 | Discharge: 2021-05-06 | Payer: MEDICARE

## 2021-05-06 ENCOUNTER — Ambulatory Visit: Admit: 2021-05-06 | Discharge: 2021-05-06 | Payer: MEDICARE

## 2021-05-06 DIAGNOSIS — I10 Essential (primary) hypertension: Secondary | ICD-10-CM

## 2021-05-06 DIAGNOSIS — F909 Attention-deficit hyperactivity disorder, unspecified type: Secondary | ICD-10-CM

## 2021-05-06 DIAGNOSIS — E079 Disorder of thyroid, unspecified: Secondary | ICD-10-CM

## 2021-05-06 DIAGNOSIS — C719 Malignant neoplasm of brain, unspecified: Secondary | ICD-10-CM

## 2021-05-06 DIAGNOSIS — R5383 Other fatigue: Secondary | ICD-10-CM

## 2021-05-06 DIAGNOSIS — F32A Depression: Secondary | ICD-10-CM

## 2021-05-06 NOTE — Progress Notes
MRI faxed to [facility] at fax [phone]. Fax was success

## 2021-05-06 NOTE — Progress Notes
[facility] NEEDED FOR EXTERNAL IMAGING    Patient info:  Name: Andrea [name]    MRN: [id number]          DOB: [birth year]      Insurance:   Payor: [employer] / Plan: [employer] NETWORK PPO - [phone] / Product Type: PPO /      Order sent to Facility:  [facility]    Orders Placed:  MRI Head w/wo contrast    Diagnosis ICD10 Code:  C71.9    Ordering Provider:  Dr. Nolon Nations    Scan needed by date:  06/10/2021

## 2021-05-07 ENCOUNTER — Encounter: Admit: 2021-05-07 | Discharge: 2021-05-07 | Payer: MEDICARE

## 2021-05-07 NOTE — Progress Notes
New lab orders faxed to [facility] [place]. Fax was success

## 2021-05-09 ENCOUNTER — Encounter: Admit: 2021-05-09 | Discharge: 2021-05-09 | Payer: MEDICARE

## 2021-05-09 DIAGNOSIS — C719 Malignant neoplasm of brain, unspecified: Secondary | ICD-10-CM

## 2021-05-23 ENCOUNTER — Encounter: Admit: 2021-05-23 | Discharge: 2021-05-23 | Payer: MEDICARE

## 2021-05-23 NOTE — Telephone Encounter
Call returned for MRI prior authorization voicemail.  Per precert no prior authorization is needed for MRI, information relayed to scheduling.

## 2021-05-24 ENCOUNTER — Encounter: Admit: 2021-05-24 | Discharge: 2021-05-24 | Payer: MEDICARE

## 2021-05-24 ENCOUNTER — Ambulatory Visit: Admit: 2021-05-24 | Discharge: 2021-05-24 | Payer: MEDICARE

## 2021-05-24 DIAGNOSIS — C719 Malignant neoplasm of brain, unspecified: Secondary | ICD-10-CM

## 2021-05-24 DIAGNOSIS — E079 Disorder of thyroid, unspecified: Secondary | ICD-10-CM

## 2021-05-24 DIAGNOSIS — F909 Attention-deficit hyperactivity disorder, unspecified type: Secondary | ICD-10-CM

## 2021-05-24 DIAGNOSIS — I10 Essential (primary) hypertension: Secondary | ICD-10-CM

## 2021-05-24 DIAGNOSIS — F322 Major depressive disorder, single episode, severe without psychotic features: Secondary | ICD-10-CM

## 2021-05-24 DIAGNOSIS — F32A Depression: Secondary | ICD-10-CM

## 2021-05-24 DIAGNOSIS — R4189 Other symptoms and signs involving cognitive functions and awareness: Secondary | ICD-10-CM

## 2021-06-03 ENCOUNTER — Encounter: Admit: 2021-06-03 | Discharge: 2021-06-03 | Payer: MEDICARE

## 2021-06-03 DIAGNOSIS — C719 Malignant neoplasm of brain, unspecified: Secondary | ICD-10-CM

## 2021-06-17 IMAGING — RF FL fluroguide for vein device
1 series · 1 of 1 positions shown · non-contrast
Comparison: none

[Series 1: run · 1 of 1 slices shown]
[im 1/1]
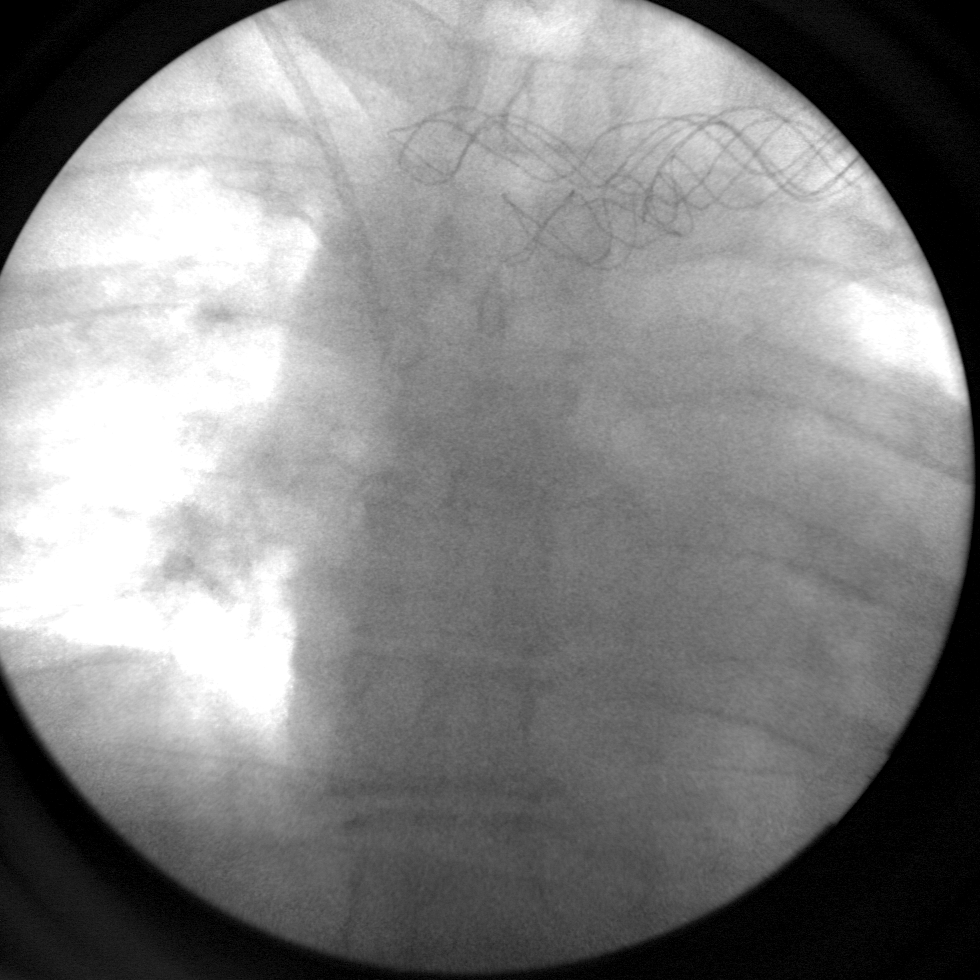

[1 of 1 positions shown; findings below may reference images not displayed]

DIAGNOSTIC STUDIES

EXAM

Fluoroscopic guidance for port placement

INDICATION

PORT
PORT. 39.6 FLUORO SECONDS. FLUORO DOSE 6.17 mGy. FLUORO IMAGES 2. TJ

TECHNIQUE

Fluoro time is 39.6 seconds. Single spot was.

COMPARISONS

None available

FINDINGS

Please see procedure note for full details. Fluoroscopic guidance was provided for port placement

IMPRESSION

Fluoroscopic guidance for port placement.

Tech Notes:

PORT. 39.6 FLUORO SECONDS. FLUORO DOSE 6.17 mGy. FLUORO IMAGES 2. TJ

## 2021-06-17 IMAGING — CR CXRPOST
1 series · 1 of 1 positions shown · non-contrast
Comparison: none

[x chest ap]
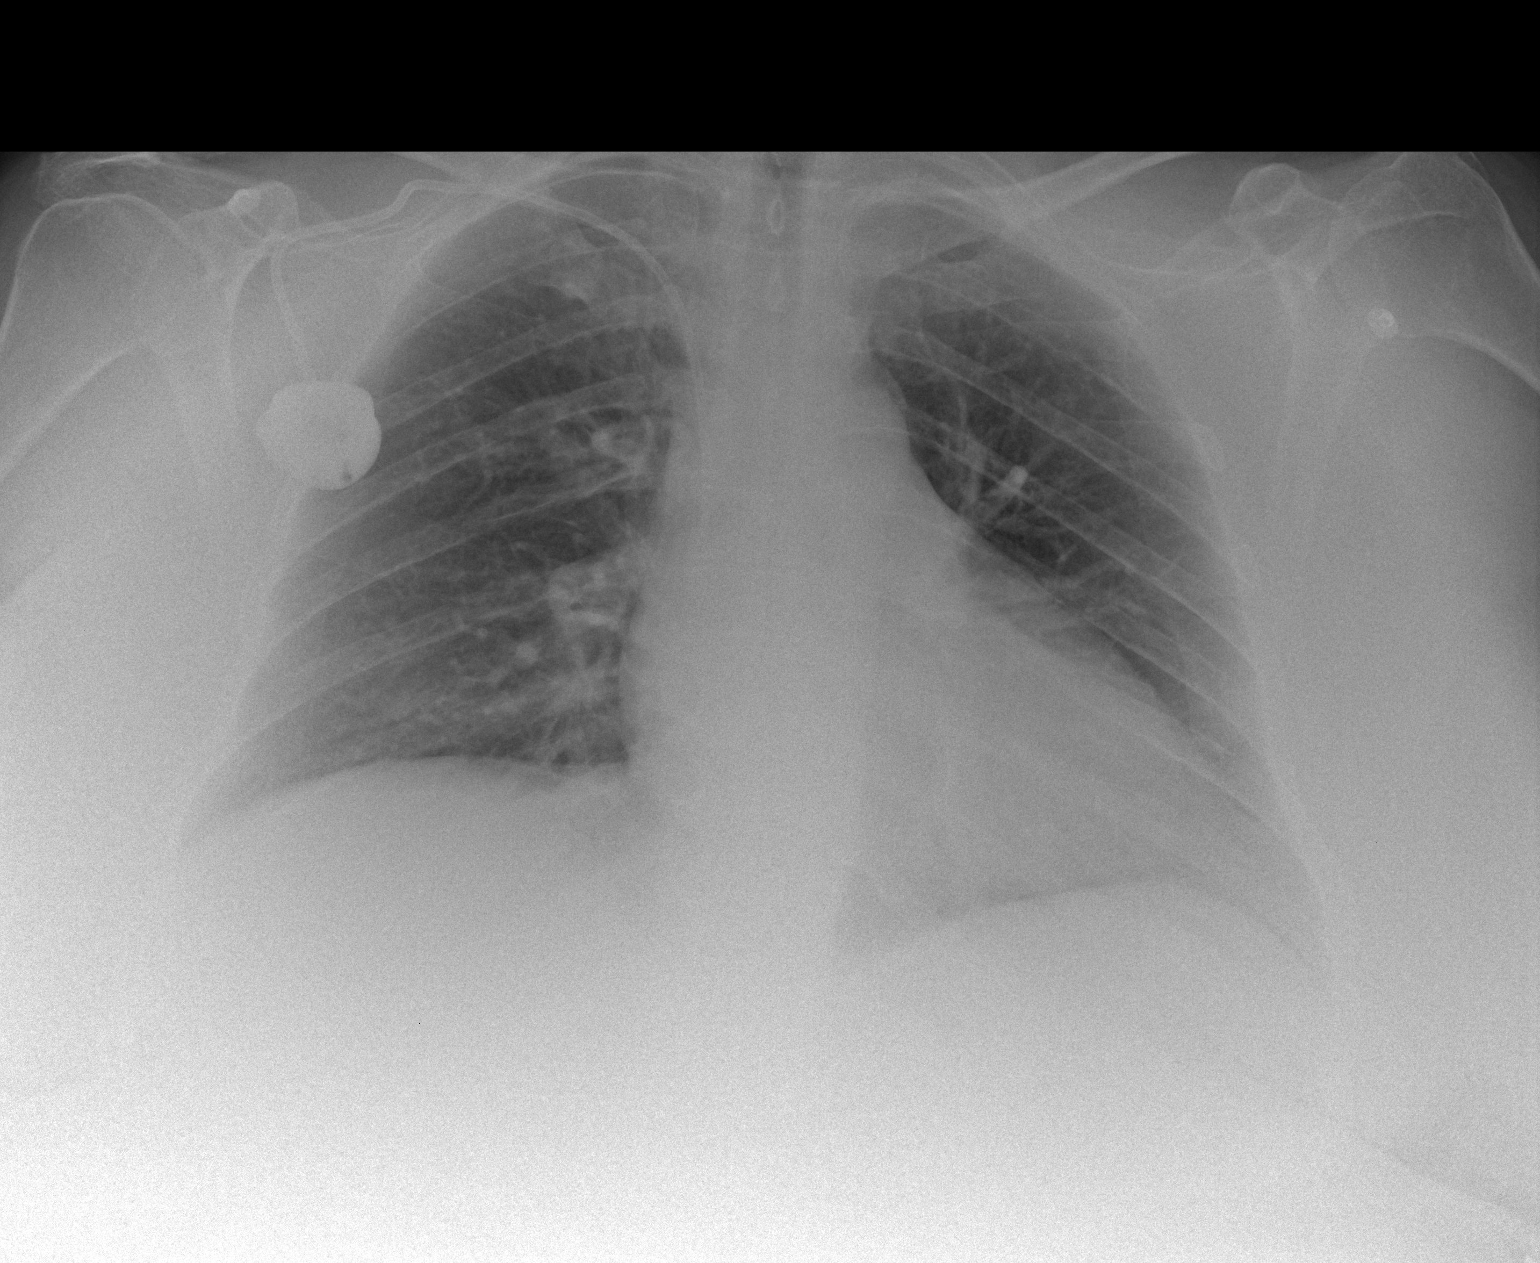

[1 of 1 positions shown; findings below may reference images not displayed]

XR chest 1v post procedure Sex:

DIAGNOSTIC STUDIES

EXAM

XR chest 1v post procedure

INDICATION

s/p port placement
rt port placement and lt port removal

TECHNIQUE

AP chest.

COMPARISONS

June 28, 2020

FINDINGS

There is a right subclavian porta catheter with the tip extending to the superior vena cava just
above the cavoatrial junction. There is no evidence of pneumothorax. Low lung volumes with mild
vascular congestion the without dense consolidation or pleural effusion. Interval discontinuation of
previously seen left IJ porta catheter.

IMPRESSION

New right subclavian porta catheter with the tip extending into the superior vena cava. No evidence
of pneumothorax.

Tech Notes:

rt port placement and lt port removal

## 2021-06-27 ENCOUNTER — Encounter: Admit: 2021-06-27 | Discharge: 2021-06-27 | Payer: MEDICARE

## 2021-07-31 ENCOUNTER — Encounter: Admit: 2021-07-31 | Discharge: 2021-07-31 | Payer: MEDICARE

## 2021-08-02 ENCOUNTER — Encounter: Admit: 2021-08-02 | Discharge: 2021-08-02 | Payer: MEDICARE

## 2021-08-06 ENCOUNTER — Encounter: Admit: 2021-08-06 | Discharge: 2021-08-06 | Payer: MEDICARE

## 2021-08-08 ENCOUNTER — Encounter: Admit: 2021-08-08 | Discharge: 2021-08-08 | Payer: MEDICARE

## 2021-08-08 DIAGNOSIS — C719 Malignant neoplasm of brain, unspecified: Secondary | ICD-10-CM

## 2021-08-09 ENCOUNTER — Encounter: Admit: 2021-08-09 | Discharge: 2021-08-09 | Payer: MEDICARE

## 2021-08-09 DIAGNOSIS — C719 Malignant neoplasm of brain, unspecified: Secondary | ICD-10-CM

## 2021-08-11 ENCOUNTER — Encounter: Admit: 2021-08-11 | Discharge: 2021-08-11 | Payer: MEDICARE

## 2021-08-20 ENCOUNTER — Encounter: Admit: 2021-08-20 | Discharge: 2021-08-20 | Payer: MEDICARE

## 2021-08-20 ENCOUNTER — Ambulatory Visit: Admit: 2021-08-20 | Discharge: 2021-08-21 | Payer: MEDICARE

## 2021-08-20 DIAGNOSIS — G40209 Localization-related (focal) (partial) symptomatic epilepsy and epileptic syndromes with complex partial seizures, not intractable, without status epilepticus: Secondary | ICD-10-CM

## 2021-08-20 DIAGNOSIS — F329 Major depressive disorder, single episode, unspecified: Secondary | ICD-10-CM

## 2021-08-20 DIAGNOSIS — C719 Malignant neoplasm of brain, unspecified: Secondary | ICD-10-CM

## 2021-08-20 MED ORDER — LAMOTRIGINE 150 MG PO TAB
150 mg | ORAL_TABLET | Freq: Two times a day (BID) | ORAL | 1 refills | Status: AC
Start: 2021-08-20 — End: ?

## 2021-08-20 MED ORDER — ZONISAMIDE 100 MG PO CAP
400 mg | ORAL_CAPSULE | Freq: Every day | ORAL | 1 refills | 90.00000 days | Status: AC
Start: 2021-08-20 — End: ?

## 2021-08-20 NOTE — Progress Notes
[facility]  Epilepsy Division  Return Patient Visit - Telemedicine, Zoom Platform    Obtained patient's verbal consent to treat them and their agreement to [employer] and [employer] via this telehealth visit during the [facility]    -----------------------------------------------------------------------------------------------------------------------------------------    1. Partial symptomatic epilepsy with complex partial seizures, not intractable, without status epilepticus (HCC)     2. Glioblastoma (HCC)     3. Reactive depression          Impression:  Andrea [name] is a very pleasant 50 y.o. female with a relevant history of?glioblastoma s/p resection?who presents to my clinic today to follow up for refractory seizures.?She has a singular seizure type consisting of disoriented aura followed by HA, impaired awareness, and automatisms (lip smacking).   ?  Since last visit, patient reports several typical breakthrough seizures in the setting of move from [place] to [place]. She is tolerating the medication well, however, we discussed due to breakthrough events without overt provoking factors, would like to adjust AEDs as detailed below. She does exhibit continued depressive symptoms with relation to isolation post move, however, adjusting. Patient was previously evaluated by palliative care and has an upcoming appointment with Dr. Neita Carp of neuro-oncology. Plan as follows:  ??  Plan/ Recommendations:  Diagnostic testing:  None new needed at this time, Will follow pended MRIs from neurooncology. Stable on last review from 05/2021 from outside MRI personal review and report   ?  Other testing or information requested:  None needed at this time.  ?  Treatment:  Continue current dose of zonisamide to 400mg  at night  Will increase patient's lamotrigine from 125mg  BID to 150mg  BID due to 3 breakthrough events over the months of September/ October. Sent to new pharmacy ([facility] in [place]).   ?  Referrals:  - Patient with missed zoom appointment with Dr. Neita Carp due to technical issues. Per patient, upcoming appointment rescheduled. Will defer surveillance imaging to provider, however, will follow  - Continue followup with palliative care, appreciate recommendations.    ?  Follow-up:  - 6 months with me  ?  Considerable time was spent on counseling the patient on the following, taking time to answer their quesions for which they verbalized understanding:  - The diagnosis was discussed including its probable causes, an explanation of the terminology, and the prognosis   - The plan for evaluation and the treatment  - Provoking factors for seizures, such as not taking prescribed antiepileptic medication, lack of sleep, fever, vomiting or diarrhea, metabolic abnormalities (electrolyte or blood sugar abnormalities), excessive alcohol, or illicit drugs.  - Seizure precautions & safety - they were advised not to swim or bathe alone and to avoid any situation in which a seizure could cause serious harm to self or others, including - but not limited to - working on heights or with heavy machinery and working over hot surfaces. Driving - The patient was advised not to drive after a seizure until on a stable regimen and at least 6 months seizure free per [place].  ?  ?  Time-based billing: Of the 30 minute encounter, > 50% of the time was spent on counseling as above.  ?  Waldron Session, M.D.  [facility]  [facility]    -----------------------------------------------------------------------------------------------------------------------------------------    Chief complaint: seizures    Sources of history: patient.    Interval History:  Patient states she had a fall in August with some resulting  compression fractures. She has recently moved from [place] to [place]. Last event noted 1 week ago on 08/12/21 with another event 1 week previous to this. Fatigue has been a chronic issue. Denies any nausea, chest pain, GI symptoms. Daughter present for supplemental history.     Previous History (updated and reviewed):  Patient is a?50 y.o.?female?with hx of globlastoma?presenting?s/p spell concerning for seizure. Per review of witnessed accounts, patient was on her way to the bathroom when she acutely lost consciousness and fell to the floor. She started having twitching on the right side of the face followed by leg shaking. She had her eyes open, lips moving.?She was guided to a chair where she continued to have blank staring, periodic tremors. She started returning to baseline, however, amnestic to the event.??  ?  Per patient, has a funny feeling that comes over her. She starts having a sharp pain going across the front and back of the head. She is awake of what is going on around her, but unable to speak. Has had head deviation to the left side with these as well. Since last seen in the ER on 11/17, she has had staring but no progression. ?  ?  Of note, patient had increased wellbutrin at the beginning of the month to 300mg  (from 150mg  BID). 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 States that she has been having poor sleep over the last several months, 2/2 to medical condition.   ?  Previously on Keppra 750mg  twice daily, Depakote 1000mg  TID. Diagnosed with glioblastoma in 2019.?On last visit, started on zonisamide 300mg  qhs.     Last clinic visit: No breakthorugh events since last evaluation. Has been tolerating the lamotrigine and zonisamide quite well. Medication regimen has been simplified with consolidating her lamotrigine pills. Main complaint is intermittent double vision. This has been a chronic problem. Patient has been spending more time outside to center herself, which has been helping. Per patient, has not been following with oncology for some time.   ?  ?  Epilepsy risk factors:  CNS Tumors  ?  ===============================================  ?  SEIZURE/ EVENT TYPES/ DESCRIPTION (Semiology)/ FREQUENCY  Type 1:  Description:?Funny feeling that comes over her. She starts having a sharp pain going across the front and back of the head. She is awake of what is going on around her, but unable to speak. Has?witnessed lip smacking?had head deviation to the left side?and diffuse?  Alteration of awareness:?Partial  Duration:?Variable  Previous frequency:?Last early January 2021, medication changed before from Keppra/ VPA.   Current frequency: 1 event in 8/22 and 2 events on 08/12/21      AEDs  Current AEDs:?Zonisamide 400mg  QHS, 125mg  BID lamotrigine  Past failed AEDs:?Keppra 750mg  BID (agitation), VPA 1000mg  TID?(platelet dysfunction)  ?  EVALUATION/TESTING:  Physiological Testing  Routine EEG:?-  EMU/ vEEG:?-  ?  Basic Structural Imaging:  Outside MRI (05/2021):        MRI Brain (11/12/2018):  1. Overall decrease size of the right parieto-occipital resection and treatment cavity compared to September 13, 2018. There continues to be complex heterogeneous debris or soft tissue within the inferior portion of the cavity without significant enhancement. Small amount of thin peripheral cavity enhancement. This study will serve as a good baseline for subsequent follow-up imaging.  2. Decreased FLAIR hyperintensity/edema surrounding the resection cavity.  3. No new signal abnormality or enhancement.  4. Small amount of scarring or nonocclusive thrombus within the mid to lateral right transverse sinus.  5. Bilateral  perinasal sinus inflammatory mucosal thickening and fluid.    Last MRI done in 01/2021, will request outside record    ===============================================      OTHER HISTORY  Past Medical History:  Medical History:   Diagnosis Date   ? ADHD    ? Depression    ? Disorder of thyroid gland    ? Hypertension        Past Surgical History:  Surgical History:   Procedure Laterality Date   ? Right craniotomy for resection of tumor Right 08/27/2018    Performed by Theotis Barrio, MD at [facility] OR   ? MICROSURGICAL TECHNIQUES - REQUIRING OPERATING MICROSCOPE USE N/A 08/27/2018    Performed by Theotis Barrio, MD at [facility] OR   ? STEREOTACTIC COMPUTER-ASSISTED CRANIAL PROCEDURE - INTRADURAL N/A 08/27/2018    Performed by Theotis Barrio, MD at [facility] OR   ? ULTRASOUND GUIDANCE - INTRAOPERATIVE  08/27/2018    Performed by Theotis Barrio, MD at [facility] OR   ? CRANIECTOMY/ BONE FLAP CRANIOTOMY EXCISION SUPRATENTORIAL BRAIN TUMOR Right 08/28/2018    Performed by Theotis Barrio, MD at [facility]   ? CHOLECYSTECTOMY     ? HX APPENDECTOMY     ? HX HYSTERECTOMY     ? HX JOINT REPLACEMENT     ? HYSTERECTOMY         Family History:  Family History   Problem Relation Age of Onset   ? Blindness Neg Hx    ? Glaucoma Neg Hx    ? Macular Degen Neg Hx        Social History:  Social History     Tobacco Use   ? Smoking status: Current Every Day Smoker     Packs/day: 0.25     Years: 30.00     Pack years: 7.50     Types: Cigarettes   ? Smokeless tobacco: Never Used   Vaping Use   ? Vaping Use: Never used   Substance Use Topics   ? Alcohol use: Yes     Comment: Seldom   ? Drug use: Never       Allergies:  Allergies   Allergen Reactions   ? Morphine ANAPHYLAXIS   ? Codeine RASH   ? Erythromycin SHORTNESS OF BREATH   ? Latex RASH   ? Keppra [Levetiracetam] AGITATION   ? Nsaids (Non-Steroidal Anti-Inflammatory Drug) SEE COMMENTS     History of GI bleed - has been instructed to avoid NSAID's       Medications:    Current Outpatient Medications:   ?  acetaminophen (TYLENOL) 325 mg tablet, Take two tablets by mouth every 6 hours as needed for Pain., Disp: , Rfl:   ?  apixaban (ELIQUIS) 5 mg tablet, Take one tablet by mouth twice daily., Disp: 60 tablet, Rfl: 0  ?  calcium carbonate (TUMS) 500 mg (200 mg elemental calcium) chewable tablet, Chew 500 mg by mouth as Needed., Disp: , Rfl:   ?  celecoxib (CELEBREX) 200 mg capsule, Take one capsule by mouth twice daily., Disp: 90 capsule, Rfl: 3  ?  duloxetine DR (CYMBALTA) 60 mg capsule, Take one capsule by mouth daily., Disp: 30 capsule, Rfl: 1  ?  escitalopram oxalate (LEXAPRO) 20 mg tablet, Take 20 mg by mouth daily. Added per 11/22/19 records from [facility], Disp: , Rfl:   ?  granisetron (KYTRIL) 1 mg tablet, Take one tablet by mouth every 12 hours as needed for Nausea., Disp: 60 tablet, Rfl: 11  ?  lamoTRIgine (LAMICTAL) 100 mg tablet, Take one tablet twice a day with 25mg  tabs for a total of 125mg ., Disp: 60 tablet, Rfl: 5  ?  lamoTRIgine (LAMICTAL) 25 mg tablet, Take one tablet twice a day with 100mg  tabs for a total of 125mg ., Disp: 60 tablet, Rfl: 5  ?  levothyroxine (SYNTHROID) 150 mcg tablet, Take 150 mcg by mouth daily 30 minutes before breakfast., Disp: , Rfl:   ?  lidocaine/prilocaine (EMLA) 2.5/2.5 % topical cream, Apply 1 g topically to affected area as Needed., Disp: , Rfl:   ?  LORazepam (ATIVAN) 2 mg tablet, Take one tablet by mouth every 6 hours as needed for Nausea, Vomiting or Other... (Max 4mg  every 6 hours as needed) for up to 7 doses., Disp: 15 tablet, Rfl: 0  ?  losartan-hydrochlorothiazide (HYZAAR) 100-12.5 mg tablet, Take 1 tablet by mouth every morning. Patient take 1/2 tablet daily., Disp: , Rfl:   ?  oxyCODONE-acetaminophen (PERCOCET) 7.5-325 mg tablet, TAKE 1 TABLET BY MOUTH TWICE DAILY AS NEEDED FOR PAIN, Disp: , Rfl:   ?  pe/hydrocod/acetaminophen/cpm (HYDROCOD-CPM-PE-ACETAMINOPHEN PO), Take 1-2 tablets by mouth every 4-6 hours as needed., Disp: , Rfl:   ?  pregabalin (LYRICA) 75 mg capsule, Take 75 mg by mouth twice daily., Disp: , Rfl:   ?  propranolol LA (INDERAL LA) 80 mg capsule, Take 80 mg by mouth daily. Added per 03/16/20 records from [facility], Disp: , Rfl:   ?  vitamins, multiple cap, Take 1 capsule by mouth daily., Disp: , Rfl:   ?  zonisamide (ZONEGRAN) 100 mg capsule, TAKE 4 CAPSULES BY MOUTH EVERY DAY AT BEDTIME, Disp: 120 capsule, Rfl: 1    Review of Systems:  The following systems were reviewed and were negative except as mentioned elsewhere:    Constitutional:  No fever/chills/sweat, weight loss, tiredness/fatigue, or poor appetite  Eyes:  No reduced vision or blurriness, double vision, or droopy eyelids  ENT:  No hearing loss, ringing in the ears, vertigo, or hoarseness  Cardiovascular:  No chest pain/angina or palpitations  Respiratory:  No shortness of breath or cough  Gastrointestinal:  No abdominal pain, nausea and/or vomiting, diarrhea, or constipation  Genitourinary:  No pain with urination, excessive urination, or incontinence  Musculoskeletal:  +back pain, no neck pain, joint pain/redness/swelling, or myalgia  Skin:  No jaundice, rash, or change in sweating  Hematologic/ Lymphatic:  No anemia, easy bruising, bleeding, or enlarged lymph nodes  Endocrine:  No temperature intolerance, polyuria, or polydipsia  Allergic/ Immunological:  No severe allergic reaction  Psychiatric:  No depression or anxiety     PHYSICAL EXAMINATION (Televisit):  Vitals: There were no vitals filed for this visit.   General:  No scleral icterus  Aerating Well    Neurologic Exam:  Mental status:  ? Alert and oriented x 3  ? Recent and remote memory intact  ? Concentration and attention intact  ? Fund of knowledge intact; mood and affect normal  ? Language including naming, repetition, comprehension is normal and spontaneous speech is normal    Cranial nerves:  ? Ocular motility full  ? Face symmetric and strength normal  ? Hearing intact and appropriately responsive to voice  ? Shoulder shrug is symmetric    Motor:  ? Muscle strength at least 4/5 and antigravity diffusely. Unable to perform confrontational testing    Reflexes, sensory exam, coordination, gait:  ? Unable to test on tele visit    ===========================================================================

## 2021-08-20 NOTE — Patient Instructions
Seizure precautions: No driving for 6 months since last seizure this is [place] state law. No climbing ladders, operating heavy machinery, cooking over a grill or stove, swimming or tub baths alone, or any activity during which acute and unexpected loss of consciousness could lead to injury to self or others.    Planned Diagnostic Testing:  Will follow obtained MRI's through neuro-oncology when obtained    Medications:   Please continue your home zonisamide 400mg  at night  Will increase your home lamotrigine from 125mg  twice daily to 150mg  twice daily. Prescription sent to [facility]    Other Instructions:   Please continue to keep seizure journal to keep track of the frequency of your events.     Return to Clinic:  6 months with me

## 2021-08-28 ENCOUNTER — Encounter: Admit: 2021-08-28 | Discharge: 2021-08-28 | Payer: MEDICARE

## 2021-09-24 ENCOUNTER — Encounter: Admit: 2021-09-24 | Discharge: 2021-09-24 | Payer: MEDICARE

## 2021-09-27 IMAGING — MR Head^Brain
10 series · 46 of 48 positions shown · non-contrast
Comparison: none

[Series 2: T1 · sagittal · 5.0mm · 0.45mm/px · 3 of 20 slices shown (1 of 2)]
[im 1/20]
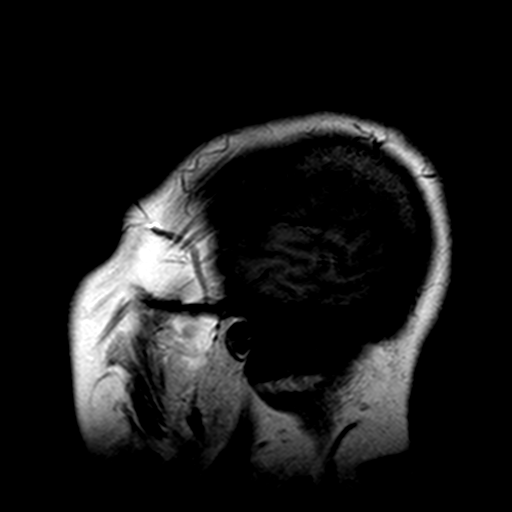
[im 10/20]
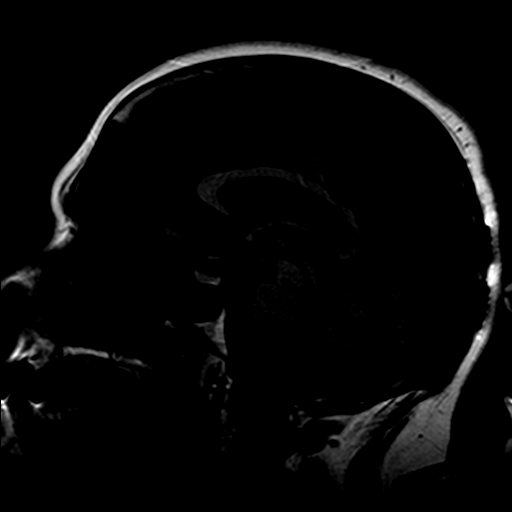
[im 20/20]
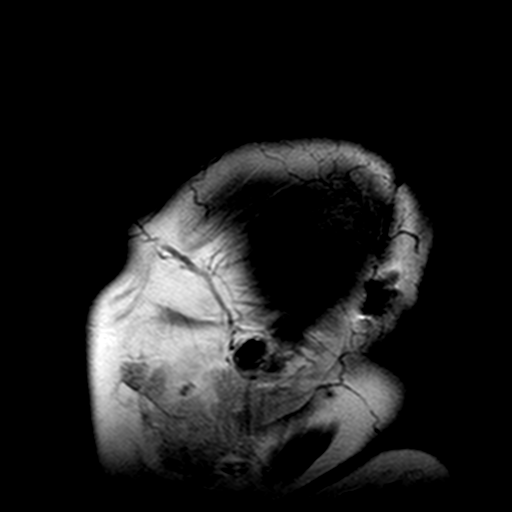

[Series 3: DWI · axial · 5.0mm · 1.80mm/px · z∈[-36,+83]mm · 11 of 63 slices shown (1 of 2)]
[im 1/63]
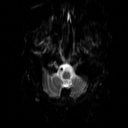
[im 7/63]
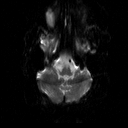
[im 13/63]
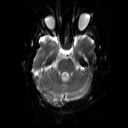
[im 19/63]
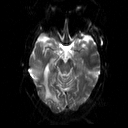
[im 25/63]
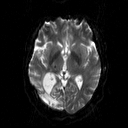
[im 32/63]
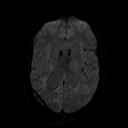
[im 38/63]
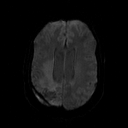
[im 44/63]
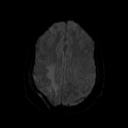
[im 50/63]
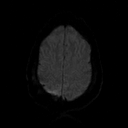
[im 56/63]
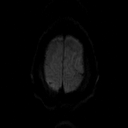
[im 63/63]
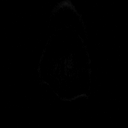

[Series 4: DWI · axial · 5.0mm · 1.80mm/px · z∈[-36,+83]mm · 4 of 21 slices shown (2 of 2)]
[im 1/21]
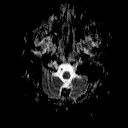
[im 7/21]
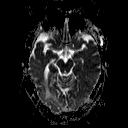
[im 14/21]
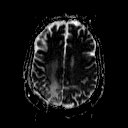
[im 21/21]
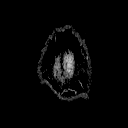

[Series 5: FLAIR · axial · 5.0mm · 0.45mm/px · z∈[-38,+99]mm · 4 of 24 slices shown]
[im 1/24]
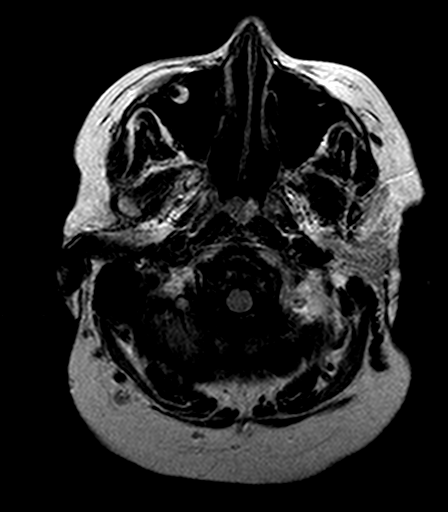
[im 8/24]
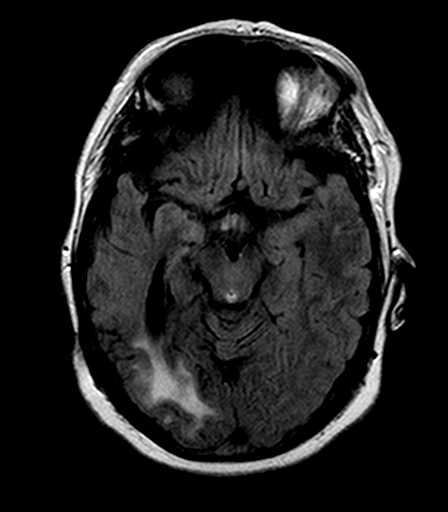
[im 16/24]
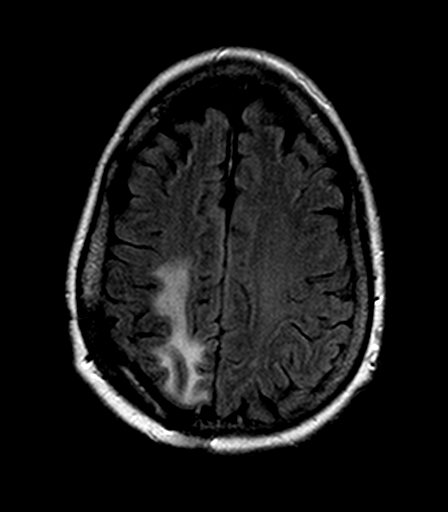
[im 24/24]
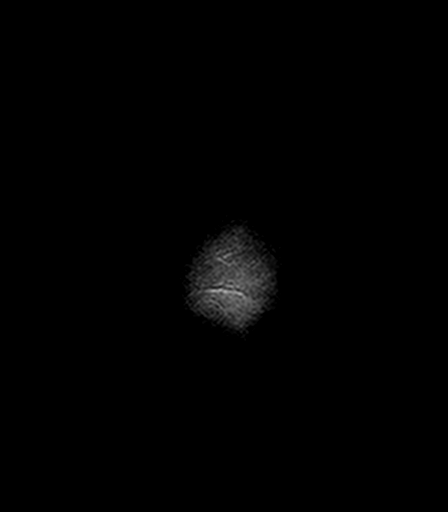

[Series 6: T2 · axial · 5.0mm · 0.72mm/px · z∈[-38,+99]mm · 4 of 24 slices shown (1 of 2)]
[im 1/24]
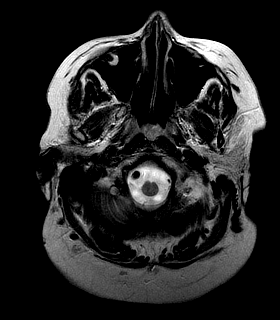
[im 8/24]
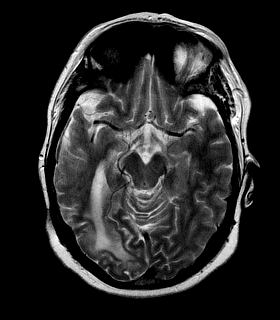
[im 16/24]
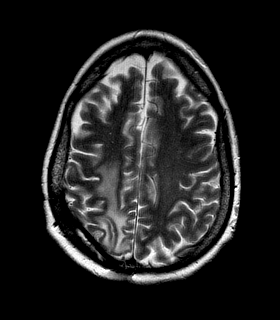
[im 24/24]
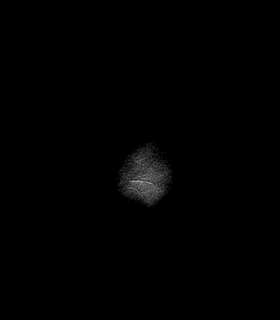

[Series 7: T1 · axial · 5.0mm · 0.45mm/px · z∈[-37,+100]mm · 4 of 24 slices shown (2 of 2)]
[im 1/24]
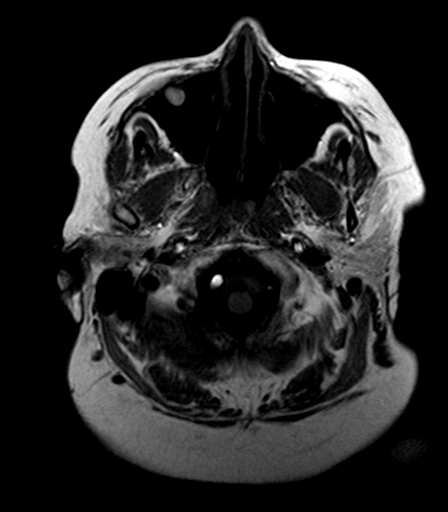
[im 8/24]
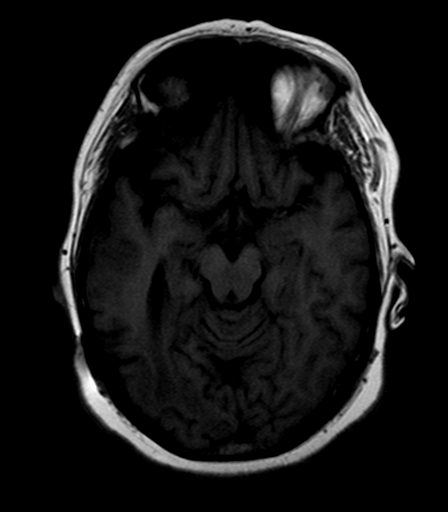
[im 16/24]
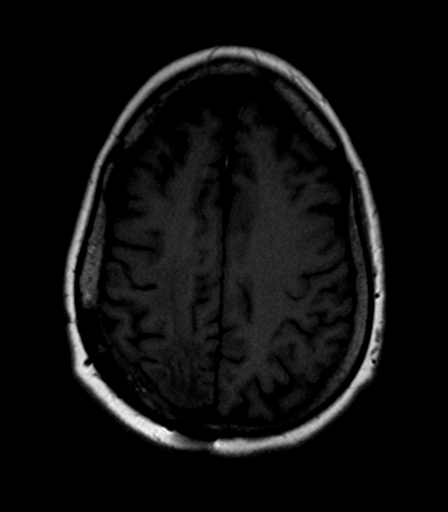
[im 24/24]
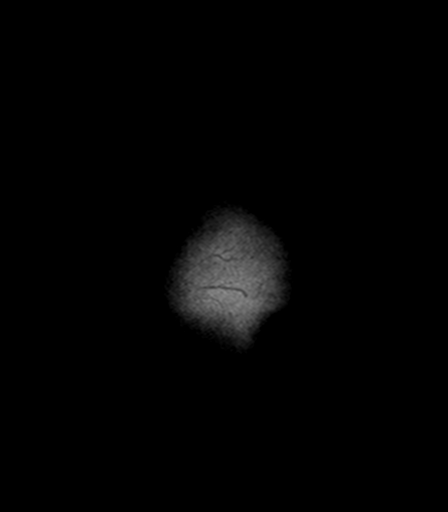

[Series 10: axial blood · axial · 5.0mm · 0.45mm/px · z∈[-74,-29]mm · 2 of 24 slices shown]
[im 1/24]
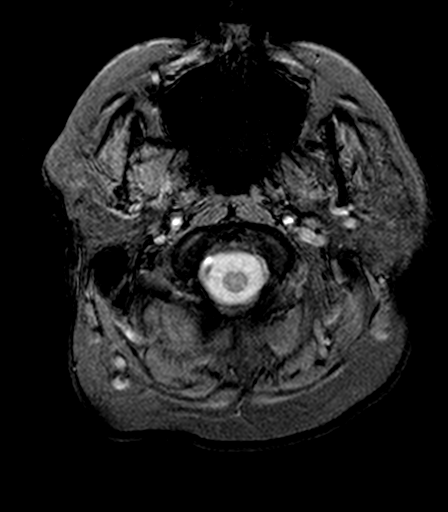
[im 8/24]
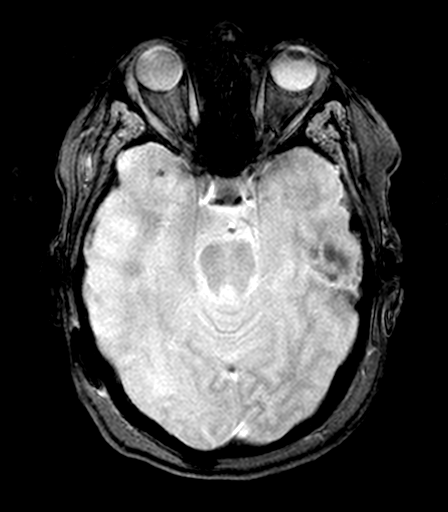

[Series 11: T2 · coronal · 5.0mm · 0.69mm/px · 5 of 26 slices shown (2 of 2)]
[im 1/26]
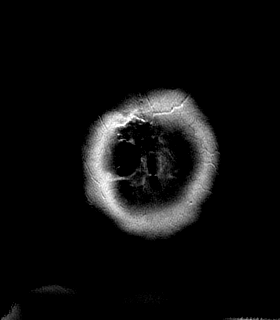
[im 7/26]
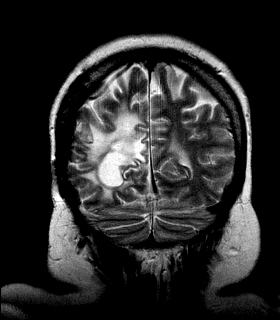
[im 13/26]
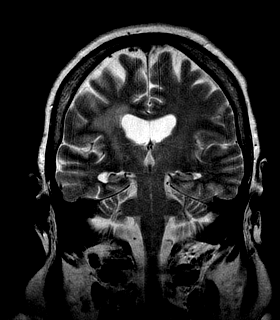
[im 19/26]
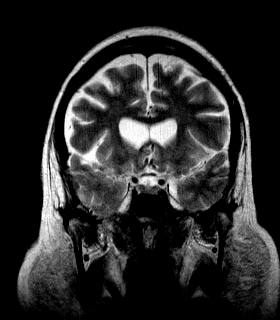
[im 26/26]
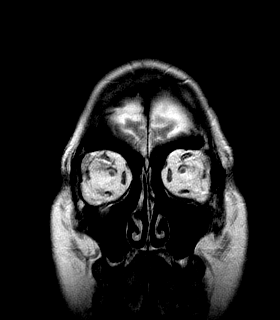

[Series 12: T1 fat-sat post-contrast · axial · 5.0mm · 0.90mm/px · z∈[-71,+76]mm · 4 of 24 slices shown (1 of 2)]
[im 1/24]
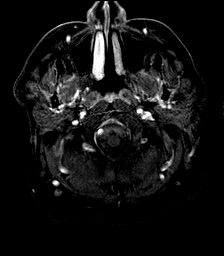
[im 8/24]
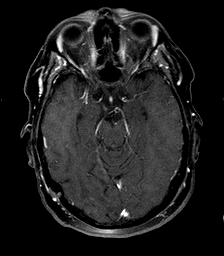
[im 16/24]
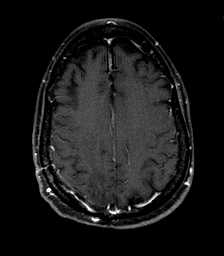
[im 24/24]
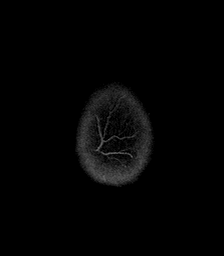

[Series 13: T1 fat-sat post-contrast · coronal · 5.0mm · 0.90mm/px · 5 of 26 slices shown (2 of 2)]
[im 1/26]
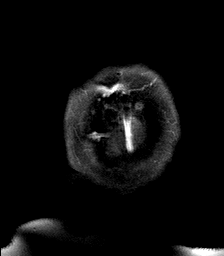
[im 7/26]
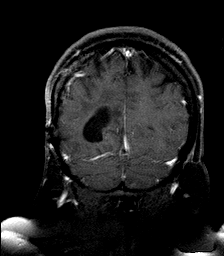
[im 13/26]
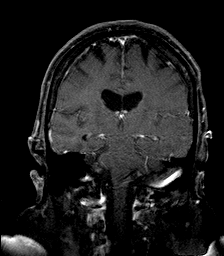
[im 19/26]
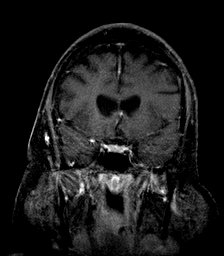
[im 26/26]
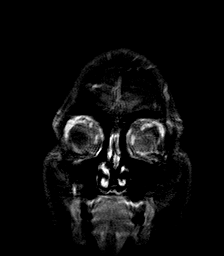

[46 of 48 positions shown; findings below may reference images not displayed]

DIAGNOSTIC STUDIES

EXAM

MAGNETIC RESONANCE IMAGING, BRAIN (INCLUDING BRAIN STEM) WITHOUT CONTRAST MATERIAL, FOLLOWED BY
CONTRAST MATERIAL(S) AND FURTHER SEQUENCES, CPT 31005

INDICATION

Follow-up glioblastoma resected [DATE]. Memory loss, loss of balance and confusion.

TECHNIQUE

Multiplanar, multipulse sequence images of the head with and without contrast. 15 milliliters of
Gadavist.

COMPARISONS

06/05/2021.

FINDINGS

The midline structures are intact. No tonsillar ectopia. There are flow voids within the vertebral,
basilar and internal carotid arteries. No definite restricted diffusion. No gross hemorrhage or
hydrocephalus. Re-demonstration postsurgical changes associated with a right occipital mass
resection. Re-demonstration of a 0.6 x 1.2 cm nodule within the mid surgical bed, image 13. Re-
demonstration of a 4 millimeter nodule within the inferior aspect of the surgical bed, image 11. Re-
demonstration of curvilinear enhancement contiguous with the inner table of the skull, image 14.
Stable vasogenic edema on the right adjacent to the surgical site. The visualized orbits appear
grossly unremarkable. The paranasal sinuses appear well aerated. Small polyp within the right
maxillary sinus. The mastoid air cells appear well aerated.

IMPRESSION

No significant change compared to the prior study. Please see the prior report.

Tech Notes:

FU GLIOBLASTOMA, RESECTED [DATE].  PT HAVING INCREASING SYMPTOMS SUCH AS MEMORY LOSS, LOSS OF
BALANCE AND CONFUSION.  15 ML GADAVIST RG

## 2021-10-01 ENCOUNTER — Encounter: Admit: 2021-10-01 | Discharge: 2021-10-01 | Payer: MEDICARE

## 2021-10-03 ENCOUNTER — Encounter: Admit: 2021-10-03 | Discharge: 2021-10-03 | Payer: MEDICARE

## 2021-10-03 DIAGNOSIS — R69 Illness, unspecified: Secondary | ICD-10-CM

## 2021-10-09 ENCOUNTER — Encounter: Admit: 2021-10-09 | Discharge: 2021-10-09 | Payer: MEDICARE

## 2021-10-09 DIAGNOSIS — F32A Depression: Secondary | ICD-10-CM

## 2021-10-09 DIAGNOSIS — F909 Attention-deficit hyperactivity disorder, unspecified type: Secondary | ICD-10-CM

## 2021-10-09 DIAGNOSIS — I10 Essential (primary) hypertension: Secondary | ICD-10-CM

## 2021-10-09 DIAGNOSIS — E079 Disorder of thyroid, unspecified: Secondary | ICD-10-CM

## 2021-10-11 ENCOUNTER — Encounter: Admit: 2021-10-11 | Discharge: 2021-10-11 | Payer: MEDICARE

## 2021-10-11 NOTE — Telephone Encounter
Called patient to get her scheduled for tele health follow up appt with Dr. Charise Killian in 4 mos.

## 2021-11-27 ENCOUNTER — Encounter: Admit: 2021-11-27 | Discharge: 2021-11-27 | Payer: MEDICARE

## 2021-11-27 ENCOUNTER — Ambulatory Visit: Admit: 2021-11-27 | Discharge: 2021-11-27 | Payer: MEDICARE

## 2021-11-27 DIAGNOSIS — H53462 Homonymous bilateral field defects, left side: Secondary | ICD-10-CM

## 2021-11-27 DIAGNOSIS — M3501 Sicca syndrome with keratoconjunctivitis: Secondary | ICD-10-CM

## 2021-11-27 NOTE — Progress Notes
Assessment and Plan:    Problem   Homonymous Hemianopia, Left    Left inferior quadrant OU     Keratoconjunctivitis Sicca (Hcc)       Homonymous hemianopia, left  H/o glioblastoma s/p resection    VF improved today. Pt is adapting better      Keratoconjunctivitis sicca (HCC)  Use artificial tears one drop four times daily to both eyes.  Tears can be used up to every hour. If using more than four times daily on a regular basis, be sure to use a PRESERVATIVE FREE formulation.    Artificial tears are also called lubricant drops. Suggested brands include TheraTears, Refresh, Systane, Blink, and Genteal.      New glasses prescription given         Return in about 1 year (around 11/27/2022) for Dilated exam, MRx, Ophth Visual Field Return.    Madelaine Etienne, MD  [facility]      HPI:  Patient presents with:  Vision Change: Pt sts sometimes sees double sts might be a little better.  Dry Eye: Pt denies watering, itching, redness and irritation.   Spots and/or Floaters: Pt denies flashes and floaters.   Medications Only: AT's prn         Exam:  Base Eye Exam     Visual Acuity (Snellen - Linear)       Right Left    Dist sc 20/30 20/20 -1    Dist ph sc 20/25 -2     Correction: Glasses          Tonometry ([facility] Tonometer, 10:38 AM)       Right Left    Pressure 14 13          Pupils       Dark React APD    Right 5.5 Brisk None    Left 4 Brisk None          Visual Fields    See HVF           Extraocular Movement       Right Left     Full, Ortho Full, Ortho          Neuro/Psych     Oriented x3: Yes    Mood/Affect: Normal          Dilation     Both eyes: 1.0% Tropicamide, 2.5% Phenylephrine @ 11:14 AM            Slit Lamp and Fundus Exam     External Exam       Right Left    External Normal Normal          Slit Lamp Exam       Right Left    Lids/Lashes Normal Normal    Conjunctiva/Sclera White and quiet White and quiet    Cornea low TF low TF    Anterior Chamber Deep and quiet Deep and quiet    Iris Flat Flat Lens Clear Clear          Fundus Exam       Right Left    Vitreous Normal Normal    Disc Sharp, healthy rim Sharp, healthy rim    C/D Ratio 0.3 0.3    Macula Flat Flat    Vessels Normal caliber and number Normal caliber and number    Periphery Attached, no breaks or tears Attached, no breaks or tears            Refraction  Wearing Rx       Sphere Cylinder Axis Add    Right +0.75 Sphere  +2.25    Left +1.25 +0.75 090 +2.25          Manifest Refraction       Sphere Cylinder Axis Dist VA    Right +1.00 Sphere  20/20-1    Left +1.25 +1.00 090 20/15-2          Final Rx       Sphere Cylinder Axis Dist VA Add    Right +1.00 Sphere  20/20-1 +2.25    Left +1.25 +1.00 090 20/15-2 +2.25    Expiration Date: 11/27/2022

## 2021-11-27 NOTE — Assessment & Plan Note
Use artificial tears one drop four times daily to both eyes.  Tears can be used up to every hour. If using more than four times daily on a regular basis, be sure to use a PRESERVATIVE FREE formulation.    Artificial tears are also called lubricant drops. Suggested brands include TheraTears, Refresh, Systane, Blink, and Genteal.

## 2021-11-27 NOTE — Assessment & Plan Note
H/o glioblastoma s/p resection    VF improved today. Pt is adapting better

## 2021-12-11 ENCOUNTER — Encounter: Admit: 2021-12-11 | Discharge: 2021-12-11 | Payer: MEDICARE

## 2022-01-01 ENCOUNTER — Encounter: Admit: 2022-01-01 | Discharge: 2022-01-01 | Payer: MEDICARE

## 2022-01-03 ENCOUNTER — Encounter: Admit: 2022-01-03 | Discharge: 2022-01-03 | Payer: MEDICARE

## 2022-01-03 NOTE — Progress Notes
MRI order faxes to [facility]

## 2022-01-03 NOTE — Progress Notes
AUTH NEEDED FOR EXTERNAL IMAGING    Patient info:  Name: Andrea [name]    MRN: [id number]          DOB: [birth year]      Insurance:   Payor: [employer] MEDICARE / Plan: [place] / Product Type: Medicare /      Order sent to Facility:  [facility]    Type of scan ordered:  MRI Head W/WO Contrast    Name of facility where test will be performed:  [facility]    Diagnosis ICD10 Code:  C71.9    Ordering Provider:  [facility]    Scan needed by date:  02/07/2022

## 2022-01-04 ENCOUNTER — Encounter: Admit: 2022-01-04 | Discharge: 2022-01-04 | Payer: MEDICARE

## 2022-01-04 ENCOUNTER — Inpatient Hospital Stay: Payer: MEDICARE

## 2022-01-05 MED ADMIN — FENTANYL CITRATE (PF) 50 MCG/ML IJ SOLN [3037]: 25 ug | INTRAVENOUS | @ 19:00:00 | Stop: 2022-01-05 | NDC 00641602701

## 2022-01-05 MED ADMIN — FENTANYL CITRATE (PF) 50 MCG/ML IJ SOLN [3037]: 50 ug | INTRAVENOUS | @ 23:00:00 | NDC 00641602701

## 2022-01-05 MED ADMIN — DEXAMETHASONE SODIUM PHOSPHATE 4 MG/ML IJ SOLN [2332]: 4 mg | INTRAVENOUS | @ 23:00:00 | NDC 67457042300

## 2022-01-05 MED ADMIN — DULOXETINE 60 MG PO CPDR [93425]: 60 mg | ORAL | @ 15:00:00 | NDC 00904704561

## 2022-01-05 MED ADMIN — HYDROXYZINE HCL 25 MG PO TAB [3774]: 25 mg | ORAL | @ 23:00:00 | Stop: 2022-01-05 | NDC 00904661761

## 2022-01-05 MED ADMIN — LAMOTRIGINE 100 MG PO TAB [82659]: 150 mg | ORAL | @ 15:00:00 | NDC 00904700861

## 2022-01-05 MED ADMIN — LEVOTHYROXINE 200 MCG PO TAB [4426]: 200 ug | ORAL | @ 13:00:00 | NDC 68180097509

## 2022-01-05 MED ADMIN — DEXAMETHASONE SODIUM PHOSPHATE 4 MG/ML IJ SOLN [2332]: 4 mg | INTRAVENOUS | @ 12:00:00 | NDC 67457042300

## 2022-01-05 MED ADMIN — HYDROCHLOROTHIAZIDE 12.5 MG PO TAB [136518]: 12.5 mg | ORAL | @ 15:00:00 | NDC 69315015501

## 2022-01-05 MED ADMIN — PREGABALIN 75 MG PO CAP [94904]: 75 mg | ORAL | @ 15:00:00 | NDC 00904700061

## 2022-01-05 MED ADMIN — FENTANYL CITRATE (PF) 50 MCG/ML IJ SOLN [3037]: 25 ug | INTRAVENOUS | @ 15:00:00 | Stop: 2022-01-05 | NDC 00641602701

## 2022-01-05 MED ADMIN — ACETAMINOPHEN 325 MG PO TAB [101]: 650 mg | ORAL | @ 12:00:00 | NDC 00904677361

## 2022-01-05 MED ADMIN — OXYCODONE-ACETAMINOPHEN 5-325 MG PO TAB [5940]: 2 | ORAL | @ 16:00:00 | NDC 00904709361

## 2022-01-05 MED ADMIN — LOSARTAN 50 MG PO TAB [76938]: 100 mg | ORAL | @ 15:00:00 | NDC 68084034711

## 2022-01-05 MED ADMIN — DEXAMETHASONE SODIUM PHOSPHATE 4 MG/ML IJ SOLN [2332]: 4 mg | INTRAVENOUS | @ 19:00:00 | NDC 67457042300

## 2022-01-05 MED ADMIN — ESCITALOPRAM OXALATE 20 MG PO TAB [86690]: 20 mg | ORAL | @ 15:00:00 | NDC 00904642761

## 2022-01-06 MED ADMIN — ESCITALOPRAM OXALATE 20 MG PO TAB [86690]: 20 mg | ORAL | @ 13:00:00 | NDC 00904642761

## 2022-01-06 MED ADMIN — ZONISAMIDE 100 MG PO CAP [27780]: 400 mg | ORAL | @ 02:00:00 | NDC 69097086107

## 2022-01-06 MED ADMIN — LAMOTRIGINE 100 MG PO TAB [82659]: 150 mg | ORAL | @ 13:00:00 | NDC 00904700861

## 2022-01-06 MED ADMIN — OXYCODONE-ACETAMINOPHEN 5-325 MG PO TAB [5940]: 2 | ORAL | NDC 00904709361

## 2022-01-06 MED ADMIN — OXYCODONE-ACETAMINOPHEN 5-325 MG PO TAB [5940]: 2 | ORAL | @ 23:00:00 | NDC 00904709361

## 2022-01-06 MED ADMIN — DEXAMETHASONE SODIUM PHOSPHATE 4 MG/ML IJ SOLN [2332]: 4 mg | INTRAVENOUS | @ 11:00:00 | NDC 67457042300

## 2022-01-06 MED ADMIN — DULOXETINE 60 MG PO CPDR [93425]: 60 mg | ORAL | @ 13:00:00 | NDC 00904704561

## 2022-01-06 MED ADMIN — OXYCODONE-ACETAMINOPHEN 5-325 MG PO TAB [5940]: 2 | ORAL | @ 14:00:00 | NDC 00904709361

## 2022-01-06 MED ADMIN — FENTANYL CITRATE (PF) 50 MCG/ML IJ SOLN [3037]: 50 ug | INTRAVENOUS | @ 12:00:00 | NDC 00641602701

## 2022-01-06 MED ADMIN — FENTANYL CITRATE (PF) 50 MCG/ML IJ SOLN [3037]: 50 ug | INTRAVENOUS | @ 05:00:00 | NDC 00641602701

## 2022-01-06 MED ADMIN — OXYCODONE-ACETAMINOPHEN 5-325 MG PO TAB [5940]: 2 | ORAL | @ 08:00:00 | NDC 00904709361

## 2022-01-06 MED ADMIN — LEVOTHYROXINE 200 MCG PO TAB [4426]: 200 ug | ORAL | @ 11:00:00 | NDC 68180097509

## 2022-01-06 MED ADMIN — FENTANYL CITRATE (PF) 50 MCG/ML IJ SOLN [3037]: 50 ug | INTRAVENOUS | @ 17:00:00 | NDC 00641602701

## 2022-01-06 MED ADMIN — LAMOTRIGINE 100 MG PO TAB [82659]: 150 mg | ORAL | @ 02:00:00 | NDC 00904700861

## 2022-01-06 MED ADMIN — PREGABALIN 75 MG PO CAP [94904]: 75 mg | ORAL | @ 02:00:00 | NDC 00904700061

## 2022-01-06 MED ADMIN — DEXAMETHASONE SODIUM PHOSPHATE 4 MG/ML IJ SOLN [2332]: 4 mg | INTRAVENOUS | @ 17:00:00 | NDC 67457042300

## 2022-01-06 MED ADMIN — DEXAMETHASONE SODIUM PHOSPHATE 4 MG/ML IJ SOLN [2332]: 4 mg | INTRAVENOUS | @ 05:00:00 | NDC 67457042300

## 2022-01-06 MED ADMIN — DEXAMETHASONE SODIUM PHOSPHATE 4 MG/ML IJ SOLN [2332]: 4 mg | INTRAVENOUS | @ 23:00:00 | NDC 67457042300

## 2022-01-06 MED ADMIN — HYDROCHLOROTHIAZIDE 12.5 MG PO TAB [136518]: 12.5 mg | ORAL | @ 13:00:00 | NDC 69315015501

## 2022-01-06 MED ADMIN — PREGABALIN 75 MG PO CAP [94904]: 75 mg | ORAL | @ 13:00:00 | NDC 00904700061

## 2022-01-06 MED ADMIN — LOSARTAN 50 MG PO TAB [76938]: 100 mg | ORAL | @ 13:00:00 | NDC 68084034711

## 2022-01-07 ENCOUNTER — Encounter: Admit: 2022-01-07 | Discharge: 2022-01-07 | Payer: MEDICARE

## 2022-01-07 MED ADMIN — FENTANYL CITRATE (PF) 50 MCG/ML IJ SOLN [3037]: 50 ug | INTRAVENOUS | @ 17:00:00 | NDC 00641602701

## 2022-01-07 MED ADMIN — DULOXETINE 60 MG PO CPDR [93425]: 60 mg | ORAL | @ 13:00:00 | NDC 00904704561

## 2022-01-07 MED ADMIN — ZONISAMIDE 100 MG PO CAP [27780]: 400 mg | ORAL | @ 01:00:00 | NDC 69097086107

## 2022-01-07 MED ADMIN — POTASSIUM CHLORIDE 20 MEQ PO TBTQ [35943]: 40 meq | ORAL | @ 13:00:00 | Stop: 2022-01-07 | NDC 00832532511

## 2022-01-07 MED ADMIN — OXYCODONE-ACETAMINOPHEN 5-325 MG PO TAB [5940]: 2 | ORAL | @ 05:00:00 | NDC 00904709361

## 2022-01-07 MED ADMIN — LAMOTRIGINE 100 MG PO TAB [82659]: 150 mg | ORAL | @ 01:00:00 | NDC 00904700861

## 2022-01-07 MED ADMIN — DEXAMETHASONE SODIUM PHOSPHATE 4 MG/ML IJ SOLN [2332]: 4 mg | INTRAVENOUS | @ 23:00:00 | NDC 67457042300

## 2022-01-07 MED ADMIN — OXYCODONE-ACETAMINOPHEN 5-325 MG PO TAB [5940]: 1 | ORAL | @ 15:00:00 | NDC 00904709361

## 2022-01-07 MED ADMIN — DEXAMETHASONE SODIUM PHOSPHATE 4 MG/ML IJ SOLN [2332]: 4 mg | INTRAVENOUS | @ 17:00:00 | NDC 67457042300

## 2022-01-07 MED ADMIN — ESCITALOPRAM OXALATE 20 MG PO TAB [86690]: 20 mg | ORAL | @ 13:00:00 | NDC 00904642761

## 2022-01-07 MED ADMIN — FENTANYL CITRATE (PF) 50 MCG/ML IJ SOLN [3037]: 50 ug | INTRAVENOUS | @ 20:00:00 | NDC 00641602701

## 2022-01-07 MED ADMIN — HYDROXYZINE HCL 25 MG PO TAB [3774]: 25 mg | ORAL | @ 07:00:00 | Stop: 2022-01-07 | NDC 00904661761

## 2022-01-07 MED ADMIN — DEXAMETHASONE SODIUM PHOSPHATE 4 MG/ML IJ SOLN [2332]: 4 mg | INTRAVENOUS | @ 05:00:00 | NDC 67457042300

## 2022-01-07 MED ADMIN — HYDROCHLOROTHIAZIDE 12.5 MG PO TAB [136518]: 12.5 mg | ORAL | @ 13:00:00 | NDC 69315015501

## 2022-01-07 MED ADMIN — PREGABALIN 75 MG PO CAP [94904]: 75 mg | ORAL | @ 01:00:00 | NDC 00904700061

## 2022-01-07 MED ADMIN — LOSARTAN 50 MG PO TAB [76938]: 100 mg | ORAL | @ 13:00:00 | NDC 68084034711

## 2022-01-07 MED ADMIN — LEVOTHYROXINE 200 MCG PO TAB [4426]: 200 ug | ORAL | @ 11:00:00 | NDC 68180097509

## 2022-01-07 MED ADMIN — LAMOTRIGINE 100 MG PO TAB [82659]: 150 mg | ORAL | @ 13:00:00 | NDC 00904700861

## 2022-01-07 MED ADMIN — SENNOSIDES-DOCUSATE SODIUM 8.6-50 MG PO TAB [40926]: 1 | ORAL | @ 06:00:00 | NDC 00536124801

## 2022-01-07 MED ADMIN — DEXAMETHASONE SODIUM PHOSPHATE 4 MG/ML IJ SOLN [2332]: 4 mg | INTRAVENOUS | @ 11:00:00 | NDC 67457042300

## 2022-01-07 MED ADMIN — FENTANYL CITRATE (PF) 50 MCG/ML IJ SOLN [3037]: 50 ug | INTRAVENOUS | @ 01:00:00 | NDC 00641602701

## 2022-01-07 MED ADMIN — PREGABALIN 75 MG PO CAP [94904]: 75 mg | ORAL | @ 13:00:00 | NDC 00904700061

## 2022-01-07 MED ADMIN — OXYCODONE-ACETAMINOPHEN 5-325 MG PO TAB [5940]: 1 | ORAL | @ 19:00:00 | NDC 00904709361

## 2022-01-08 ENCOUNTER — Inpatient Hospital Stay: Admit: 2022-01-08 | Discharge: 2022-01-08 | Payer: MEDICARE

## 2022-01-08 ENCOUNTER — Encounter: Admit: 2022-01-08 | Discharge: 2022-01-08 | Payer: MEDICARE

## 2022-01-08 DIAGNOSIS — G4733 Obstructive sleep apnea (adult) (pediatric): Secondary | ICD-10-CM

## 2022-01-08 DIAGNOSIS — F32A Depression: Secondary | ICD-10-CM

## 2022-01-08 DIAGNOSIS — I10 Essential (primary) hypertension: Secondary | ICD-10-CM

## 2022-01-08 DIAGNOSIS — F909 Attention-deficit hyperactivity disorder, unspecified type: Secondary | ICD-10-CM

## 2022-01-08 DIAGNOSIS — E079 Disorder of thyroid, unspecified: Secondary | ICD-10-CM

## 2022-01-08 MED ORDER — SODIUM CHLORIDE 0.9 % IV SOLP
INTRAVENOUS | 0 refills | Status: DC
Start: 2022-01-08 — End: 2022-01-08
  Administered 2022-01-08: 18:00:00 via INTRAVENOUS

## 2022-01-08 MED ORDER — PHENYLEPHRINE 40 MCG/ML IN NS IV DRIP (STD CONC)
INTRAVENOUS | 0 refills | Status: DC
Start: 2022-01-08 — End: 2022-01-08
  Administered 2022-01-08: 19:00:00 .2 ug/kg/min via INTRAVENOUS
  Administered 2022-01-08 (×2): 100 ug via INTRAVENOUS
  Administered 2022-01-08: 19:00:00 .2 ug/kg/min via INTRAVENOUS

## 2022-01-08 MED ORDER — MIDAZOLAM 1 MG/ML IJ SOLN
INTRAVENOUS | 0 refills | Status: DC
Start: 2022-01-08 — End: 2022-01-08
  Administered 2022-01-08: 18:00:00 2 mg via INTRAVENOUS

## 2022-01-08 MED ORDER — PROPOFOL INJ 10 MG/ML IV VIAL
INTRAVENOUS | 0 refills | Status: DC
Start: 2022-01-08 — End: 2022-01-08
  Administered 2022-01-08: 18:00:00 50 mg via INTRAVENOUS
  Administered 2022-01-08: 18:00:00 150 mg via INTRAVENOUS

## 2022-01-08 MED ORDER — MANNITOL 20 % 20 % IV SOLP
INTRAVENOUS | 0 refills | Status: DC
Start: 2022-01-08 — End: 2022-01-08
  Administered 2022-01-08: 19:00:00 60 g via INTRAVENOUS

## 2022-01-08 MED ORDER — LEVETIRACETAM 500 MG/5 ML IV SOLN
INTRAVENOUS | 0 refills | Status: DC
Start: 2022-01-08 — End: 2022-01-08
  Administered 2022-01-08: 19:00:00 1000 mg via INTRAVENOUS

## 2022-01-08 MED ORDER — FENTANYL CITRATE (PF) 50 MCG/ML IJ SOLN
INTRAVENOUS | 0 refills | Status: DC
Start: 2022-01-08 — End: 2022-01-08
  Administered 2022-01-08: 18:00:00 50 ug via INTRAVENOUS

## 2022-01-08 MED ORDER — ROCURONIUM 10 MG/ML IV SOLN
INTRAVENOUS | 0 refills | Status: DC
Start: 2022-01-08 — End: 2022-01-08
  Administered 2022-01-08: 18:00:00 50 mg via INTRAVENOUS
  Administered 2022-01-08: 19:00:00 20 mg via INTRAVENOUS

## 2022-01-08 MED ORDER — CEFAZOLIN 1 GRAM IJ SOLR
INTRAVENOUS | 0 refills | Status: DC
Start: 2022-01-08 — End: 2022-01-08
  Administered 2022-01-08 (×2): 3 g via INTRAVENOUS

## 2022-01-08 MED ORDER — DEXAMETHASONE SODIUM PHOSPHATE 4 MG/ML IJ SOLN
INTRAVENOUS | 0 refills | Status: DC
Start: 2022-01-08 — End: 2022-01-08
  Administered 2022-01-08: 19:00:00 10 mg via INTRAVENOUS

## 2022-01-08 MED ORDER — POTASSIUM CHLORIDE IN WATER 10 MEQ/50 ML IV PGBK
INTRAVENOUS | 0 refills | Status: DC
Start: 2022-01-08 — End: 2022-01-08
  Administered 2022-01-08: 21:00:00 10 meq via INTRAVENOUS

## 2022-01-08 MED ORDER — ACETAMINOPHEN 1,000 MG/100 ML (10 MG/ML) IV SOLN
INTRAVENOUS | 0 refills | Status: DC
Start: 2022-01-08 — End: 2022-01-08
  Administered 2022-01-08: 22:00:00 1000 mg via INTRAVENOUS

## 2022-01-08 MED ORDER — REMIFENTANYL 1000MCG IN NS 20ML (OR)
INTRAVENOUS | 0 refills | Status: DC
Start: 2022-01-08 — End: 2022-01-08
  Administered 2022-01-08: 21:00:00 .07 ug/kg/min via INTRAVENOUS
  Administered 2022-01-08: 18:00:00 .08 ug/kg/min via INTRAVENOUS
  Administered 2022-01-08: 21:00:00 .07 ug/kg/min via INTRAVENOUS
  Administered 2022-01-08 (×3): .08 ug/kg/min via INTRAVENOUS

## 2022-01-08 MED ORDER — PROPOFOL 10 MG/ML IV EMUL 100 ML (INFUSION)(AM)(OR)
INTRAVENOUS | 0 refills | Status: DC
Start: 2022-01-08 — End: 2022-01-08
  Administered 2022-01-08 (×2): 140 ug/kg/min via INTRAVENOUS
  Administered 2022-01-08: 22:00:00 120 ug/kg/min via INTRAVENOUS
  Administered 2022-01-08 (×2): 150 ug/kg/min via INTRAVENOUS

## 2022-01-08 MED ORDER — ELECTROLYTE-A IV SOLP
INTRAVENOUS | 0 refills | Status: DC
Start: 2022-01-08 — End: 2022-01-08
  Administered 2022-01-08: 18:00:00 via INTRAVENOUS

## 2022-01-08 MED ORDER — ARTIFICIAL TEARS (PF) SINGLE DOSE DROPS GROUP
OPHTHALMIC | 0 refills | Status: DC
Start: 2022-01-08 — End: 2022-01-08
  Administered 2022-01-08: 18:00:00 2 [drp] via OPHTHALMIC

## 2022-01-08 MED ORDER — LIDOCAINE (PF) 200 MG/10 ML (2 %) IJ SYRG
INTRAVENOUS | 0 refills | Status: DC
Start: 2022-01-08 — End: 2022-01-08
  Administered 2022-01-08: 18:00:00 100 mg via INTRAVENOUS

## 2022-01-08 MED ORDER — ONDANSETRON HCL (PF) 4 MG/2 ML IJ SOLN
INTRAVENOUS | 0 refills | Status: DC
Start: 2022-01-08 — End: 2022-01-08
  Administered 2022-01-08: 23:00:00 4 mg via INTRAVENOUS

## 2022-01-08 MED ORDER — SUCCINYLCHOLINE CHLORIDE 20 MG/ML IJ SOLN
INTRAVENOUS | 0 refills | Status: DC
Start: 2022-01-08 — End: 2022-01-08
  Administered 2022-01-08: 18:00:00 120 mg via INTRAVENOUS

## 2022-01-08 MED ORDER — GLYCOPYRROLATE 0.2 MG/ML IJ SOLN
INTRAVENOUS | 0 refills | Status: DC
Start: 2022-01-08 — End: 2022-01-08
  Administered 2022-01-08: 20:00:00 .2 mg via INTRAVENOUS

## 2022-01-08 MED ORDER — PHENYLEPHRINE HCL IN 0.9% NACL 1 MG/10 ML (100 MCG/ML) IV SYRG
INTRAVENOUS | 0 refills | Status: DC
Start: 2022-01-08 — End: 2022-01-08
  Administered 2022-01-08 (×2): 100 ug via INTRAVENOUS

## 2022-01-08 MED ADMIN — THROMBIN (BOVINE) 5,000 UNIT TP SOLR [164515]: 5000 [IU] | TOPICAL | @ 20:00:00 | Stop: 2022-01-08 | NDC 60793031501

## 2022-01-08 MED ADMIN — ESCITALOPRAM OXALATE 20 MG PO TAB [86690]: 20 mg | ORAL | @ 14:00:00 | NDC 00904642761

## 2022-01-08 MED ADMIN — SODIUM CHLORIDE 0.9 % IV SOLP [27838]: 1000 mL | @ 17:00:00 | Stop: 2022-01-08 | NDC 00338004904

## 2022-01-08 MED ADMIN — FENTANYL CITRATE (PF) 50 MCG/ML IJ SOLN [3037]: 50 ug | INTRAVENOUS | @ 01:00:00 | NDC 00641602701

## 2022-01-08 MED ADMIN — ZONISAMIDE 100 MG PO CAP [27780]: 400 mg | ORAL | @ 02:00:00 | NDC 50268081611

## 2022-01-08 MED ADMIN — LOSARTAN 50 MG PO TAB [76938]: 100 mg | ORAL | @ 14:00:00 | NDC 68084034711

## 2022-01-08 MED ADMIN — SODIUM CHLORIDE 0.9 % IR SOLN [11403]: 1000 mL | @ 20:00:00 | Stop: 2022-01-08 | NDC 00338004804

## 2022-01-08 MED ADMIN — HYDROCHLOROTHIAZIDE 12.5 MG PO TAB [136518]: 12.5 mg | ORAL | @ 14:00:00 | NDC 69315015501

## 2022-01-08 MED ADMIN — BACITRACIN ZINC 500 UNIT/GRAM TP OINT [13818]: 1 | TOPICAL | @ 19:00:00 | Stop: 2022-01-08 | NDC 14428000888

## 2022-01-08 MED ADMIN — FENTANYL CITRATE (PF) 50 MCG/ML IJ SOLN [3037]: 50 ug | INTRAVENOUS | @ 11:00:00 | NDC 00641602701

## 2022-01-08 MED ADMIN — OXYCODONE-ACETAMINOPHEN 5-325 MG PO TAB [5940]: 2 | ORAL | @ 07:00:00 | NDC 00904709361

## 2022-01-08 MED ADMIN — DULOXETINE 60 MG PO CPDR [93425]: 60 mg | ORAL | @ 14:00:00 | NDC 00904704561

## 2022-01-08 MED ADMIN — LAMOTRIGINE 100 MG PO TAB [82659]: 150 mg | ORAL | @ 02:00:00 | NDC 00904700861

## 2022-01-08 MED ADMIN — PREGABALIN 75 MG PO CAP [94904]: 75 mg | ORAL | @ 14:00:00 | NDC 00904700061

## 2022-01-08 MED ADMIN — PREGABALIN 75 MG PO CAP [94904]: 75 mg | ORAL | @ 02:00:00 | NDC 00904700061

## 2022-01-08 MED ADMIN — ACETAMINOPHEN 325 MG PO TAB [101]: 650 mg | ORAL | @ 06:00:00 | NDC 00904677361

## 2022-01-08 MED ADMIN — DEXAMETHASONE SODIUM PHOSPHATE 4 MG/ML IJ SOLN [2332]: 4 mg | INTRAVENOUS | @ 05:00:00 | NDC 67457042300

## 2022-01-08 MED ADMIN — DEXAMETHASONE SODIUM PHOSPHATE 4 MG/ML IJ SOLN [2332]: 4 mg | INTRAVENOUS | @ 11:00:00 | NDC 67457042300

## 2022-01-08 MED ADMIN — OXYCODONE-ACETAMINOPHEN 5-325 MG PO TAB [5940]: 2 | ORAL | @ 02:00:00 | NDC 00904709361

## 2022-01-08 MED ADMIN — LIDOCAINE-EPINEPHRINE 1 %-1:100,000 IJ SOLN [15955]: 10 mL | INTRAMUSCULAR | @ 20:00:00 | Stop: 2022-01-08 | NDC 00409317817

## 2022-01-08 MED ADMIN — OXYCODONE-ACETAMINOPHEN 5-325 MG PO TAB [5940]: 1 | ORAL | @ 14:00:00 | NDC 00904709361

## 2022-01-08 MED ADMIN — LAMOTRIGINE 100 MG PO TAB [82659]: 150 mg | ORAL | @ 14:00:00 | NDC 00904700861

## 2022-01-08 MED ADMIN — LEVOTHYROXINE 200 MCG PO TAB [4426]: 200 ug | ORAL | @ 11:00:00 | NDC 68180097509

## 2022-01-08 MED ADMIN — FENTANYL CITRATE (PF) 50 MCG/ML IJ SOLN [3037]: 50 ug | INTRAVENOUS | @ 05:00:00 | NDC 00641602701

## 2022-01-08 MED ADMIN — HYDROXYZINE HCL 25 MG PO TAB [3774]: 25 mg | ORAL | @ 03:00:00 | Stop: 2022-01-08 | NDC 00904661761

## 2022-01-08 MED ADMIN — CEFAZOLIN 1 GRAM IJ SOLR [1445]: 1000 mL | @ 20:00:00 | Stop: 2022-01-08 | NDC 00143992490

## 2022-01-08 NOTE — Telephone Encounter
Inpatient sleep apnea screening was performed on 01/07/2022 using a WatchPAT 300 device.     In summary:  Apnea/hypopnea index (AHI) = 6.1 events/hour  Mean saturation = 93%  Lowest saturation = 88%  Time spent with saturation < 88% = 0.8 minutes    This test has been forwarded to Dr. Nicole Kindred for review, and her final interpretation will be uploaded to the EMR as soon as it's available. In the interim, please feel free to contact me with any questions, or if additional information is needed.    Thank you,    Angela Nevin, M.S., RPSGT  Lead Polysomnographic Technologist  [email or web address]  [phone]

## 2022-01-09 ENCOUNTER — Encounter: Admit: 2022-01-09 | Discharge: 2022-01-09 | Payer: MEDICARE

## 2022-01-09 MED ADMIN — FENTANYL CITRATE (PF) 50 MCG/ML IJ SOLN [3037]: 50 ug | INTRAVENOUS | @ 04:00:00 | NDC 00409909412

## 2022-01-09 MED ADMIN — CEFAZOLIN INJ 1GM IVP [210319]: 2 g | INTRAVENOUS | @ 15:00:00 | Stop: 2022-01-09 | NDC 60505614200

## 2022-01-09 MED ADMIN — LORAZEPAM 2 MG/ML IJ SYRG [86485]: 0.5 mg | INTRAVENOUS | @ 10:00:00 | Stop: 2022-01-09 | NDC 00409198503

## 2022-01-09 MED ADMIN — CEFAZOLIN INJ 1GM IVP [210319]: 2 g | INTRAVENOUS | @ 23:00:00 | Stop: 2022-01-09 | NDC 60505614200

## 2022-01-09 MED ADMIN — PREGABALIN 75 MG PO CAP [94904]: 75 mg | ORAL | @ 03:00:00 | NDC 00904700061

## 2022-01-09 MED ADMIN — DEXAMETHASONE SODIUM PHOSPHATE 4 MG/ML IJ SOLN [2332]: 4 mg | INTRAVENOUS | @ 17:00:00 | Stop: 2022-01-10 | NDC 67457042300

## 2022-01-09 MED ADMIN — LAMOTRIGINE 150 MG PO TAB [77127]: 150 mg | ORAL | @ 14:00:00 | NDC 68382000905

## 2022-01-09 MED ADMIN — DEXAMETHASONE SODIUM PHOSPHATE 4 MG/ML IJ SOLN [2332]: 4 mg | INTRAVENOUS | @ 05:00:00 | NDC 67457042300

## 2022-01-09 MED ADMIN — PREGABALIN 75 MG PO CAP [94904]: 75 mg | ORAL | @ 14:00:00 | NDC 00904700061

## 2022-01-09 MED ADMIN — LAMOTRIGINE 150 MG PO TAB [77127]: 150 mg | ORAL | @ 03:00:00 | NDC 68382000905

## 2022-01-09 MED ADMIN — DEXAMETHASONE SODIUM PHOSPHATE 4 MG/ML IJ SOLN [2332]: 4 mg | INTRAVENOUS | @ 11:00:00 | Stop: 2022-01-10 | NDC 67457042300

## 2022-01-09 MED ADMIN — CEFAZOLIN INJ 1GM IVP [210319]: 2 g | INTRAVENOUS | @ 07:00:00 | Stop: 2022-01-09 | NDC 60505614200

## 2022-01-09 MED ADMIN — TAMSULOSIN 0.4 MG PO CAP [80077]: 0.4 mg | ORAL | @ 18:00:00 | Stop: 2022-01-16 | NDC 00904640161

## 2022-01-09 MED ADMIN — LOSARTAN 50 MG PO TAB [76938]: 100 mg | ORAL | @ 14:00:00 | NDC 68084034711

## 2022-01-09 MED ADMIN — WATER FOR INJECTION, STERILE IJ SOLN [79513]: 20 mL | INTRAVENOUS | @ 23:00:00 | Stop: 2022-01-09 | NDC 63323018508

## 2022-01-09 MED ADMIN — DULOXETINE 60 MG PO CPDR [93425]: 60 mg | ORAL | @ 14:00:00 | NDC 00904704561

## 2022-01-09 MED ADMIN — POTASSIUM CHLORIDE 20 MEQ PO TBTQ [35943]: 40 meq | ORAL | @ 11:00:00 | Stop: 2023-01-09 | NDC 00832532511

## 2022-01-09 MED ADMIN — ZONISAMIDE 100 MG PO CAP [27780]: 400 mg | ORAL | @ 03:00:00 | NDC 69097086107

## 2022-01-09 MED ADMIN — DEXAMETHASONE SODIUM PHOSPHATE 4 MG/ML IJ SOLN [2332]: 4 mg | INTRAVENOUS | @ 23:00:00 | Stop: 2022-01-10 | NDC 67457042300

## 2022-01-09 MED ADMIN — ESCITALOPRAM OXALATE 20 MG PO TAB [86690]: 20 mg | ORAL | @ 14:00:00 | NDC 00904642761

## 2022-01-09 MED ADMIN — MIDAZOLAM 1 MG/ML IJ SOLN [10607]: 2 mg | INTRAVENOUS | @ 04:00:00 | Stop: 2022-01-09 | NDC 00409230516

## 2022-01-09 MED ADMIN — LEVOTHYROXINE 100 MCG PO TAB [4423]: 200 ug | ORAL | @ 11:00:00 | NDC 00904695361

## 2022-01-09 MED ADMIN — OXYCODONE-ACETAMINOPHEN 5-325 MG PO TAB [5940]: 1 | ORAL | @ 20:00:00 | NDC 00406051223

## 2022-01-09 MED ADMIN — FENTANYL CITRATE (PF) 50 MCG/ML IJ SOLN [3037]: 50 ug | INTRAVENOUS | @ 08:00:00 | Stop: 2022-01-09 | NDC 00641602701

## 2022-01-09 MED ADMIN — GADOBENATE DIMEGLUMINE 529 MG/ML (0.1MMOL/0.2ML) IV SOLN [135881]: 20 mL | INTRAVENOUS | @ 05:00:00 | Stop: 2022-01-09 | NDC 00270516415

## 2022-01-09 MED ADMIN — SENNOSIDES-DOCUSATE SODIUM 8.6-50 MG PO TAB [40926]: 1 | ORAL | @ 14:00:00 | NDC 00536124801

## 2022-01-09 MED ADMIN — HYDROCHLOROTHIAZIDE 12.5 MG PO TAB [136518]: 12.5 mg | ORAL | @ 14:00:00 | NDC 69315015501

## 2022-01-09 MED ADMIN — FENTANYL CITRATE (PF) 50 MCG/ML IJ SOLN [3037]: 50 ug | INTRAVENOUS | @ 09:00:00 | Stop: 2022-01-09 | NDC 00641602701

## 2022-01-09 MED ADMIN — OXYCODONE-ACETAMINOPHEN 5-325 MG PO TAB [5940]: 1 | ORAL | @ 07:00:00 | NDC 00406051223

## 2022-01-09 MED ADMIN — LABETALOL 5 MG/ML IV SYRG [86579]: 10 mg | INTRAVENOUS | @ 06:00:00 | Stop: 2022-01-09 | NDC 00409233924

## 2022-01-09 NOTE — Progress Notes
In hospital sleep screen reviewed and demonstrates osa.

## 2022-01-10 ENCOUNTER — Encounter: Admit: 2022-01-10 | Discharge: 2022-01-10 | Payer: MEDICARE

## 2022-01-10 ENCOUNTER — Inpatient Hospital Stay: Admit: 2022-01-10 | Discharge: 2022-01-10 | Payer: MEDICARE

## 2022-01-10 DIAGNOSIS — F909 Attention-deficit hyperactivity disorder, unspecified type: Secondary | ICD-10-CM

## 2022-01-10 DIAGNOSIS — F32A Depression: Secondary | ICD-10-CM

## 2022-01-10 DIAGNOSIS — I10 Essential (primary) hypertension: Secondary | ICD-10-CM

## 2022-01-10 DIAGNOSIS — E079 Disorder of thyroid, unspecified: Secondary | ICD-10-CM

## 2022-01-10 MED ADMIN — DEXAMETHASONE 4 MG PO TAB [2327]: 4 mg | ORAL | @ 11:00:00 | Stop: 2022-01-12 | NDC 00054817525

## 2022-01-10 MED ADMIN — OXYCODONE-ACETAMINOPHEN 5-325 MG PO TAB [5940]: 1 | ORAL | @ 03:00:00 | NDC 00904709361

## 2022-01-10 MED ADMIN — DEXAMETHASONE 4 MG PO TAB [2327]: 4 mg | ORAL | @ 17:00:00 | Stop: 2022-01-12 | NDC 00054817525

## 2022-01-10 MED ADMIN — DULOXETINE 60 MG PO CPDR [93425]: 60 mg | ORAL | @ 14:00:00 | NDC 00904704561

## 2022-01-10 MED ADMIN — ZONISAMIDE 100 MG PO CAP [27780]: 400 mg | ORAL | @ 02:00:00 | NDC 50268081611

## 2022-01-10 MED ADMIN — DEXAMETHASONE 4 MG PO TAB [2327]: 4 mg | ORAL | @ 23:00:00 | Stop: 2022-01-12 | NDC 00054817525

## 2022-01-10 MED ADMIN — DEXAMETHASONE 4 MG PO TAB [2327]: 4 mg | ORAL | @ 04:00:00 | Stop: 2022-01-12 | NDC 00054817525

## 2022-01-10 MED ADMIN — PREGABALIN 75 MG PO CAP [94904]: 75 mg | ORAL | @ 14:00:00 | NDC 00904700061

## 2022-01-10 MED ADMIN — LOSARTAN 50 MG PO TAB [76938]: 100 mg | ORAL | @ 14:00:00 | NDC 68084034711

## 2022-01-10 MED ADMIN — PREGABALIN 75 MG PO CAP [94904]: 75 mg | ORAL | @ 02:00:00 | NDC 00904700061

## 2022-01-10 MED ADMIN — OXYCODONE-ACETAMINOPHEN 5-325 MG PO TAB [5940]: 2 | ORAL | @ 14:00:00 | NDC 00904709361

## 2022-01-10 MED ADMIN — LEVOTHYROXINE 200 MCG PO TAB [4426]: 200 ug | ORAL | @ 11:00:00 | NDC 68180097509

## 2022-01-10 MED ADMIN — SENNOSIDES-DOCUSATE SODIUM 8.6-50 MG PO TAB [40926]: 1 | ORAL | @ 02:00:00 | NDC 00536124801

## 2022-01-10 MED ADMIN — SODIUM CHLORIDE 0.9 % IV SOLP [27838]: 1000 mL | INTRAVENOUS | @ 22:00:00 | Stop: 2022-01-10 | NDC 00338004904

## 2022-01-10 MED ADMIN — LAMOTRIGINE 150 MG PO TAB [77127]: 150 mg | ORAL | @ 02:00:00 | NDC 68382000905

## 2022-01-10 MED ADMIN — FENTANYL CITRATE (PF) 50 MCG/ML IJ SOLN [3037]: 25 ug | INTRAVENOUS | @ 17:00:00 | Stop: 2022-01-10 | NDC 00409909412

## 2022-01-10 MED ADMIN — LAMOTRIGINE 150 MG PO TAB [77127]: 150 mg | ORAL | @ 14:00:00 | NDC 68382000905

## 2022-01-10 MED ADMIN — ESCITALOPRAM OXALATE 20 MG PO TAB [86690]: 20 mg | ORAL | @ 14:00:00 | NDC 00904642761

## 2022-01-10 MED ADMIN — TAMSULOSIN 0.4 MG PO CAP [80077]: 0.4 mg | ORAL | @ 14:00:00 | Stop: 2022-01-16 | NDC 68084029911

## 2022-01-10 MED ADMIN — SENNOSIDES-DOCUSATE SODIUM 8.6-50 MG PO TAB [40926]: 1 | ORAL | @ 14:00:00 | NDC 00536124801

## 2022-01-10 MED ADMIN — HYDROCHLOROTHIAZIDE 12.5 MG PO TAB [136518]: 12.5 mg | ORAL | @ 14:00:00 | NDC 69315015501

## 2022-01-10 MED ADMIN — FENTANYL CITRATE (PF) 50 MCG/ML IJ SOLN [3037]: 50 ug | INTRAVENOUS | @ 07:00:00 | Stop: 2022-01-10 | NDC 00409909412

## 2022-01-11 MED ADMIN — DEXAMETHASONE 4 MG PO TAB [2327]: 4 mg | ORAL | Stop: 2022-01-11 | NDC 00054817525

## 2022-01-11 MED ADMIN — LOSARTAN 50 MG PO TAB [76938]: 100 mg | ORAL | @ 14:00:00 | NDC 68084034711

## 2022-01-11 MED ADMIN — SENNOSIDES-DOCUSATE SODIUM 8.6-50 MG PO TAB [40926]: 1 | ORAL | @ 14:00:00 | NDC 00536124801

## 2022-01-11 MED ADMIN — HYDROCHLOROTHIAZIDE 12.5 MG PO TAB [136518]: 12.5 mg | ORAL | @ 14:00:00 | NDC 69315015501

## 2022-01-11 MED ADMIN — DEXAMETHASONE 4 MG PO TAB [2327]: 4 mg | ORAL | @ 17:00:00 | Stop: 2022-01-11 | NDC 00054817525

## 2022-01-11 MED ADMIN — LEVOTHYROXINE 200 MCG PO TAB [4426]: 200 ug | ORAL | @ 12:00:00 | NDC 68180097509

## 2022-01-11 MED ADMIN — OXYCODONE-ACETAMINOPHEN 5-325 MG PO TAB [5940]: 1 | ORAL | @ 05:00:00 | NDC 00904709361

## 2022-01-11 MED ADMIN — HEPARIN (PORCINE) 10,000 UNIT/ML IJ SOLN (IMS) [211189]: 7500 [IU] | SUBCUTANEOUS | @ 14:00:00 | NDC 63739096411

## 2022-01-11 MED ADMIN — LAMOTRIGINE 150 MG PO TAB [77127]: 150 mg | ORAL | @ 01:00:00 | NDC 68382000905

## 2022-01-11 MED ADMIN — ESCITALOPRAM OXALATE 20 MG PO TAB [86690]: 20 mg | ORAL | @ 14:00:00 | NDC 00904642761

## 2022-01-11 MED ADMIN — DEXAMETHASONE 4 MG PO TAB [2327]: 4 mg | ORAL | @ 05:00:00 | Stop: 2022-01-12 | NDC 00054817525

## 2022-01-11 MED ADMIN — LAMOTRIGINE 150 MG PO TAB [77127]: 150 mg | ORAL | @ 14:00:00 | NDC 68382000905

## 2022-01-11 MED ADMIN — TAMSULOSIN 0.4 MG PO CAP [80077]: 0.4 mg | ORAL | @ 14:00:00 | Stop: 2022-01-16 | NDC 68084029911

## 2022-01-11 MED ADMIN — ACETAMINOPHEN 325 MG PO TAB [101]: 650 mg | ORAL | @ 14:00:00 | NDC 00904677361

## 2022-01-11 MED ADMIN — DULOXETINE 60 MG PO CPDR [93425]: 60 mg | ORAL | @ 14:00:00 | NDC 00904704561

## 2022-01-11 MED ADMIN — HEPARIN (PORCINE) 10,000 UNIT/ML IJ SOLN (IMS) [211189]: 7500 [IU] | SUBCUTANEOUS | @ 20:00:00 | NDC 63739096411

## 2022-01-11 MED ADMIN — DEXAMETHASONE 4 MG PO TAB [2327]: 4 mg | ORAL | @ 12:00:00 | Stop: 2022-01-11 | NDC 00054817525

## 2022-01-11 MED ADMIN — PREGABALIN 75 MG PO CAP [94904]: 75 mg | ORAL | @ 14:00:00 | NDC 00904700061

## 2022-01-11 MED ADMIN — OXYCODONE-ACETAMINOPHEN 5-325 MG PO TAB [5940]: 2 | ORAL | NDC 00904709361

## 2022-01-11 MED ADMIN — WATER FOR INJECTION, STERILE IJ SOLN [79513]: 10 mL | INTRAVENOUS | @ 17:00:00 | Stop: 2022-01-11 | NDC 00409488717

## 2022-01-11 MED ADMIN — PREGABALIN 75 MG PO CAP [94904]: 75 mg | ORAL | @ 01:00:00 | NDC 00904700061

## 2022-01-11 MED ADMIN — ZONISAMIDE 100 MG PO CAP [27780]: 400 mg | ORAL | @ 01:00:00 | NDC 69097086107

## 2022-01-12 MED ADMIN — TAMSULOSIN 0.4 MG PO CAP [80077]: 0.4 mg | ORAL | @ 13:00:00 | Stop: 2022-01-12 | NDC 68084029911

## 2022-01-12 MED ADMIN — PREGABALIN 75 MG PO CAP [94904]: 75 mg | ORAL | @ 03:00:00 | NDC 00904700061

## 2022-01-12 MED ADMIN — HEPARIN (PORCINE) 10,000 UNIT/ML IJ SOLN (IMS) [211189]: 7500 [IU] | SUBCUTANEOUS | @ 05:00:00 | NDC 63739096411

## 2022-01-12 MED ADMIN — OXYCODONE-ACETAMINOPHEN 5-325 MG PO TAB [5940]: 2 | ORAL | @ 08:00:00 | Stop: 2022-01-12 | NDC 00904709361

## 2022-01-12 MED ADMIN — HEPARIN (PORCINE) 10,000 UNIT/ML IJ SOLN (IMS) [211189]: 7500 [IU] | SUBCUTANEOUS | @ 11:00:00 | Stop: 2022-01-12 | NDC 63739096411

## 2022-01-12 MED ADMIN — CALCIUM CARBONATE 200 MG CALCIUM (500 MG) PO CHEW [9385]: 500 mg | ORAL | @ 03:00:00 | NDC 66553000401

## 2022-01-12 MED ADMIN — LEVOTHYROXINE 200 MCG PO TAB [4426]: 200 ug | ORAL | @ 11:00:00 | Stop: 2022-01-12 | NDC 68180097509

## 2022-01-12 MED ADMIN — HYDROCHLOROTHIAZIDE 12.5 MG PO TAB [136518]: 12.5 mg | ORAL | @ 13:00:00 | Stop: 2022-01-12 | NDC 69315015501

## 2022-01-12 MED ADMIN — SENNOSIDES-DOCUSATE SODIUM 8.6-50 MG PO TAB [40926]: 1 | ORAL | @ 13:00:00 | Stop: 2022-01-12 | NDC 00536124801

## 2022-01-12 MED ADMIN — ESCITALOPRAM OXALATE 20 MG PO TAB [86690]: 20 mg | ORAL | @ 13:00:00 | Stop: 2022-01-12 | NDC 00904642761

## 2022-01-12 MED ADMIN — DEXAMETHASONE 4 MG PO TAB [2327]: 4 mg | ORAL | @ 05:00:00 | Stop: 2022-01-14 | NDC 00054817525

## 2022-01-12 MED ADMIN — ZONISAMIDE 100 MG PO CAP [27780]: 400 mg | ORAL | @ 03:00:00 | NDC 69097086107

## 2022-01-12 MED ADMIN — DEXAMETHASONE 4 MG PO TAB [2327]: 4 mg | ORAL | @ 11:00:00 | Stop: 2022-01-12 | NDC 00054817525

## 2022-01-12 MED ADMIN — PREGABALIN 75 MG PO CAP [94904]: 75 mg | ORAL | @ 13:00:00 | Stop: 2022-01-12 | NDC 00904700061

## 2022-01-12 MED ADMIN — LOSARTAN 50 MG PO TAB [76938]: 100 mg | ORAL | @ 13:00:00 | Stop: 2022-01-12 | NDC 68084034711

## 2022-01-12 MED ADMIN — LAMOTRIGINE 150 MG PO TAB [77127]: 150 mg | ORAL | @ 03:00:00 | NDC 68382000905

## 2022-01-12 MED ADMIN — DULOXETINE 60 MG PO CPDR [93425]: 60 mg | ORAL | @ 13:00:00 | Stop: 2022-01-12 | NDC 00904704561

## 2022-01-12 MED ADMIN — SENNOSIDES-DOCUSATE SODIUM 8.6-50 MG PO TAB [40926]: 1 | ORAL | @ 03:00:00 | NDC 00536124801

## 2022-01-12 MED ADMIN — LAMOTRIGINE 150 MG PO TAB [77127]: 150 mg | ORAL | @ 13:00:00 | Stop: 2022-01-12 | NDC 68382000905

## 2022-01-14 ENCOUNTER — Encounter: Admit: 2022-01-14 | Discharge: 2022-01-14 | Payer: MEDICARE

## 2022-01-16 ENCOUNTER — Encounter: Admit: 2022-01-16 | Discharge: 2022-01-16 | Payer: MEDICARE

## 2022-01-17 ENCOUNTER — Encounter: Admit: 2022-01-17 | Discharge: 2022-01-17 | Payer: MEDICARE

## 2022-01-17 NOTE — Telephone Encounter
Verified 2 patient identifiers    Patient sent in a mychart message with concerns about swelling after surgery with Dr. Sabra Heck. Dr. Sabra Heck looked at photos sent in by patient and said the swelling was normal, but stated that patient shouldn't be wearing her glasses over the incision area. This RN called patient and let her know this information and suggested she take the hinge off her glasses until the incision was healed. Patient also asked if she could use ice on the area and this RN let patient know that she could use ice for 17mn on and 250m off. Patient was agreeable to this plan of care and had no further questions or concerns at this time.

## 2022-01-22 ENCOUNTER — Encounter: Admit: 2022-01-22 | Discharge: 2022-01-22 | Payer: MEDICARE

## 2022-01-22 ENCOUNTER — Ambulatory Visit: Admit: 2022-01-22 | Discharge: 2022-01-22 | Payer: MEDICARE

## 2022-01-22 DIAGNOSIS — C719 Malignant neoplasm of brain, unspecified: Secondary | ICD-10-CM

## 2022-01-22 DIAGNOSIS — F909 Attention-deficit hyperactivity disorder, unspecified type: Secondary | ICD-10-CM

## 2022-01-22 DIAGNOSIS — I10 Essential (primary) hypertension: Secondary | ICD-10-CM

## 2022-01-22 DIAGNOSIS — Z4889 Encounter for other specified surgical aftercare: Secondary | ICD-10-CM

## 2022-01-22 DIAGNOSIS — F32A Depression: Secondary | ICD-10-CM

## 2022-01-22 DIAGNOSIS — E079 Disorder of thyroid, unspecified: Secondary | ICD-10-CM

## 2022-01-22 NOTE — Progress Notes
Subjective:       History of Present Illness  Andrea [name] is a 51 y.o. female. With a PMH including ADHD, depression, and hypertension.  Patient was previously admitted on 07/2018, for memory issues and headaches. During this time MRI head demonstrated large cerebral necrotic enhancing mass centered within the right posterior cerebral hemisphere, favored for high-grade glioma. On 08/2018, patient underwent right craniotomy.  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  Patient underwent radiation and concurrent temozolomide in January 2020.  MRI on 02/20/2021 revealed overall unchanged appearance of postsurgical marginal enhancement along the resection cavity with a focal area of unchanged nodular enhancement at the medial margin.    Patient was recently admitted from an OSH on 01/04/22, for fall and left sided weakness.   CT head and MRI demonstrated right temporal enhancing lesion consistent with progression of known GBM, which was last resected in 2019.  On 01/08/22, patient underwent right temporal crani with lesion resection with Dr. Hyacinth Meeker.  Pathology: Consistent with recurrent high-grade glioma.    Patient presents today for her 2-week follow-up via telehealth.  Patient states she is doing well after surgery.  She does endorse some occasional headaches and notes some swelling on the right side of her face.  She has been using ice packs to help reduce down the swelling.  She states sometimes she has a hard time getting comfortable going to sleep due to the pressure on the right side of her face.  Patient states she is eating and drinking well.  She denies having any nausea, vomiting, diarrhea, or constipation.  Patient denies having any other concerns at this time.      Allergies as of 01/22/2022 - Reviewed 01/09/2022   Allergen Reaction Noted   ? Morphine ANAPHYLAXIS 08/26/2018   ? Codeine RASH 08/26/2018   ? Erythromycin SHORTNESS OF BREATH 08/26/2018   ? Latex RASH 08/26/2018 ? Keppra [levetiracetam] AGITATION 09/20/2019   ? Nsaids (non-steroidal anti-inflammatory drug) SEE COMMENTS 09/21/2018       Medical History:   Diagnosis Date   ? ADHD    ? Depression    ? Disorder of thyroid gland    ? Hypertension        Surgical History:   Procedure Laterality Date   ? Right craniotomy for resection of tumor Right 08/27/2018    Performed by Theotis Barrio, MD at [facility]   ? MICROSURGICAL TECHNIQUES - REQUIRING OPERATING MICROSCOPE USE N/A 08/27/2018    Performed by Theotis Barrio, MD at [facility]   ? STEREOTACTIC COMPUTER-ASSISTED CRANIAL PROCEDURE - INTRADURAL N/A 08/27/2018    Performed by [facility]otis [facility]rio, MD at [facility]   ? ULTRASOUND GUIDANCE - INTRAOPERATIVE  08/27/2018    Performed by [facility]rio, MD at [facility]   ? CRANIECTOMY/ BONE FLAP CRANIOTOMY EXCISION SUPRATENTORIAL BRAIN TUMOR Right 08/28/2018    Performed by [facility]rio, MD at [facility] OR   ? CRANIECTOMY/ BONE FLAP CRANIOTOMY EXCISION SUPRATENTORIAL BRAIN TUMOR Right 01/08/2022    Performed by Evaristo Bury, MD at [facility]   ? CHOLECYSTECTOMY     ? HX APPENDECTOMY     ? HX HYSTERECTOMY     ? HX JOINT REPLACEMENT     ? HYSTERECTOMY         Family History   Problem Relation Age of Onset   ? Blindness Neg Hx    ? Glaucoma Neg Hx    ? Macular Degen Neg Hx  Review of Systems   Constitutional: Negative for activity change, appetite change, chills, fatigue and fever.   HENT: Positive for facial swelling.    Eyes: Negative.    Respiratory: Negative.    Cardiovascular: Negative.    Gastrointestinal: Negative.    Musculoskeletal: Negative.    Neurological: Positive for headaches.   Psychiatric/Behavioral: Negative.                Objective:         ? acetaminophen (TYLENOL) 325 mg tablet Take two tablets by mouth every 6 hours as needed for Pain.   ? apixaban (ELIQUIS) 5 mg tablet Take one tablet by mouth twice daily. Okay to restart on 01/22/22   ? calcium carbonate (TUMS) 500 mg (200 mg elemental calcium) chewable tablet Chew one tablet by mouth as Needed.   ? duloxetine DR (CYMBALTA) 60 mg capsule Take one capsule by mouth daily.   ? escitalopram oxalate (LEXAPRO) 20 mg tablet Take one tablet by mouth daily. Added per 11/22/19 records from [place] [facility]   ? lamoTRIgine (LAMICTAL) 150 mg tablet Take one tablet by mouth twice daily for 180 days. Take one tablet twice a day with 25mg  tabs for a total of 125mg .   ? levothyroxine (SYNTHROID) 200 mcg tablet Take one tablet by mouth daily 30 minutes before breakfast.   ? lidocaine/prilocaine (EMLA) 2.5/2.5 % topical cream Apply one g topically to affected area as Needed.   ? losartan-hydrochlorothiazide (HYZAAR) 100-12.5 mg tablet Take one tablet by mouth every morning.   ? oxyCODONE (ROXICODONE) 5 mg tablet Take one tablet to two tablets by mouth every 6 hours as needed for Pain.   ? oxyCODONE-acetaminophen (PERCOCET) 7.5-325 mg tablet TAKE 1 TABLET BY MOUTH TWICE DAILY AS NEEDED FOR PAIN   ? pregabalin (LYRICA) 75 mg capsule Take one capsule by mouth twice daily.   ? vitamins, multiple cap Take one capsule by mouth daily.   ? zonisamide (ZONEGRAN) 100 mg capsule Take four capsules by mouth daily for 180 days. (Patient taking differently: Take four capsules by mouth at bedtime daily.)     There were no vitals filed for this visit.  There is no height or weight on file to calculate BMI.     CBC w diff    Lab Results   Component Value Date/Time    WBC 11.4 (H) 01/11/2022 04:05 AM    RBC 3.63 (L) 01/11/2022 04:05 AM    HGB 10.5 (L) 01/11/2022 04:05 AM    HCT 31.7 (L) 01/11/2022 04:05 AM    MCV 87.4 01/11/2022 04:05 AM    MCH 29.0 01/11/2022 04:05 AM    MCHC 33.3 01/11/2022 04:05 AM    RDW 15.1 (H) 01/11/2022 04:05 AM    PLTCT 265 01/11/2022 04:05 AM    MPV 7.4 01/11/2022 04:05 AM    Lab Results   Component Value Date/Time    NEUT 85 (H) 01/11/2022 04:05 AM    ANC 9.68 (H) 01/11/2022 04:05 AM    LYMA 11 (L) 01/11/2022 04:05 AM    ALC 1.27 01/11/2022 04:05 AM    MONA 4 01/11/2022 04:05 AM    AMC 0.41 01/11/2022 04:05 AM    EOSA 0 01/11/2022 04:05 AM    AEC 0.00 01/11/2022 04:05 AM    BASA 0 01/11/2022 04:05 AM    ABC 0.03 01/11/2022 04:05 AM        Comprehensive Metabolic Profile    Lab Results   Component Value Date/Time  NA 140 01/11/2022 04:05 AM    K 4.0 01/11/2022 04:05 AM    CL 104 01/11/2022 04:05 AM    CO2 26 01/11/2022 04:05 AM    GAP 10 01/11/2022 04:05 AM    BUN 21 01/11/2022 04:05 AM    CR 0.79 01/11/2022 04:05 AM    GLU 120 (H) 01/11/2022 04:05 AM    Lab Results   Component Value Date/Time    CA 8.8 01/11/2022 04:05 AM    PO4 3.8 01/09/2022 02:10 AM    ALBUMIN 3.2 (L) 01/11/2022 04:05 AM    TOTPROT 6.2 01/11/2022 04:05 AM    ALKPHOS 39 01/11/2022 04:05 AM    AST 19 01/11/2022 04:05 AM    ALT 23 01/11/2022 04:05 AM    TOTBILI 0.4 01/11/2022 04:05 AM    GFR >60 09/13/2019 10:40 PM    GFRAA >60 09/13/2019 10:40 PM            Physical Exam  Constitutional:       Appearance: Normal appearance. She is obese.   HENT:      Head:      Comments: Mild right facial swelling noted.       Mouth/Throat:      Mouth: Mucous membranes are moist.      Pharynx: Oropharynx is clear.   Eyes:      Extraocular Movements: Extraocular movements intact.      Conjunctiva/sclera: Conjunctivae normal.      Pupils: Pupils are equal, round, and reactive to light.   Pulmonary:      Effort: Pulmonary effort is normal.   Neurological:      Mental Status: She is alert and oriented to person, place, and time.   Psychiatric:         Mood and Affect: Mood normal.         Behavior: Behavior normal.         Thought Content: Thought content normal.         Judgment: Judgment normal.       Right cranial Incision: vicryl rapide. Clean, dry, and intact. No presence of redness, or wound separation noted.       Assessment and Plan:       Patient presents today for follow up, status post Right temporal craniotomy, for resection of brain lesion with Dr. Hyacinth Meeker on 01/08/22.    Discussed pathology report with the patient and answered all questions.     Incision is clean, dry, intact. No presence of erythema, drainage, swelling noted  Call if temperature greater than 100.48F, incision red, drainage or odor noted from incision, pain that is uncontrolled with pain medication or any questions/concerns.  Discussed ongoing would care. Please continue with your current wound regimen    Please follow up with Dr. Hyacinth Meeker on 02/13/22 as previously scheduled.     Please follow up with Dr. Neita Carp on 02/13/22 as previously scheduled.   All questions answered                       Total Time Today was 30 minutes in the following activities: Preparing to see the patient, Obtaining and/or reviewing separately obtained history, Performing a medically appropriate examination and/or evaluation, Counseling and educating the patient/family/caregiver, Documenting clinical information in the electronic or other health record and Independently interpreting results (not separately reported) and communicating results to the patient/family/caregiver     Rayne Du, APRN-NP  Phone: [phone]  Pager: [phone]

## 2022-01-22 NOTE — Patient Instructions
It was a pleasure seeing you today in the neurosurgery clinic via Telehealth. Our Nurse Practitioner, Verdis Frederickson, would like you to keep your follow up appointment with Dr. Sabra Heck as scheduled.    If you have any questions or concerns, please don't hesitate to call us or send a message via [facility].  Thank you and take care!     Shela Commons RN, BSN  Clinical Nurse Coordinator  [facility]  Phone: [phone]  Fax: [phone]

## 2022-01-31 ENCOUNTER — Encounter: Admit: 2022-01-31 | Discharge: 2022-01-31 | Payer: MEDICARE

## 2022-01-31 DIAGNOSIS — C719 Malignant neoplasm of brain, unspecified: Secondary | ICD-10-CM

## 2022-01-31 MED ORDER — CEPHALEXIN 500 MG PO CAP
500 mg | ORAL_CAPSULE | Freq: Four times a day (QID) | ORAL | 0 refills | Status: AC
Start: 2022-01-31 — End: ?

## 2022-02-05 ENCOUNTER — Ambulatory Visit: Admit: 2022-02-05 | Discharge: 2022-02-06 | Payer: MEDICARE

## 2022-02-05 DIAGNOSIS — C719 Malignant neoplasm of brain, unspecified: Secondary | ICD-10-CM

## 2022-02-05 DIAGNOSIS — G620 Drug-induced polyneuropathy: Secondary | ICD-10-CM

## 2022-02-05 DIAGNOSIS — F329 Major depressive disorder, single episode, unspecified: Secondary | ICD-10-CM

## 2022-02-05 DIAGNOSIS — G40209 Localization-related (focal) (partial) symptomatic epilepsy and epileptic syndromes with complex partial seizures, not intractable, without status epilepticus: Secondary | ICD-10-CM

## 2022-02-05 MED ORDER — LAMOTRIGINE 150 MG PO TAB
150 mg | ORAL_TABLET | Freq: Two times a day (BID) | ORAL | 1 refills | Status: AC
Start: 2022-02-05 — End: ?

## 2022-02-05 MED ORDER — ZONISAMIDE 100 MG PO CAP
400 mg | ORAL_CAPSULE | Freq: Every evening | ORAL | 1 refills | 90.00000 days | Status: AC
Start: 2022-02-05 — End: ?

## 2022-02-05 NOTE — Progress Notes
[facility]  Epilepsy Division  Return Patient Visit - Telemedicine, Zoom Platform  -----------------------------------------------------------------------------------------------------------------------------------------    1. Partial symptomatic epilepsy with complex partial seizures, not intractable, without status epilepticus (HCC)        2. Glioblastoma (HCC)        3. 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 female with a relevant history of?glioblastoma s/p resection?who presents to my clinic today to follow up for refractory seizures.?She has a singular seizure type consisting of disoriented aura followed by HA, impaired awareness, and automatisms (lip smacking). Fortunately, this has not occurred on current therapy since 07/2021.   ?  Since last visit, patient with fall in 12/2021 with initial evaluation at [facility]. On evaluation, found to have subsequent progression of R temporal enhancing lesion consistent with progression of known GBM. She did have a subsequent resection and has been doing well since. She is currently comanaged with Dr. Neita Carp of oncology as well.     Plan/ Recommendations:  Diagnostic testing:  None needed from seizure perspective at this time.  ?  Other testing or information requested:  None needed at this time.  ?  Treatment:  Continue current dose of zonisamide to 400mg  at night  Continue lamotrigine 150mg  BID  From neurology perspective, ok to discontinue lexapro as on concurrent therapy with cymbalta monotherapy as patient with pain concerns with it contributing to fatigue symptoms.   ?  Referrals:  - None new. Due for oncology and N[facility] followup later this month.    ?  Follow-up:  - 6 months with me  ?  Considerable time was spent on counseling the patient on the following, taking time to answer their quesions for which they verbalized understanding:  - The diagnosis was discussed including its probable causes, an explanation of the terminology, and the prognosis   - The plan for evaluation and the treatment  - Provoking factors for seizures, such as not taking prescribed antiepileptic medication, lack of sleep, fever, vomiting or diarrhea, metabolic abnormalities (electrolyte or blood sugar abnormalities), excessive alcohol, or illicit drugs.  - Seizure precautions & safety - they were advised not to swim or bathe alone and to avoid any situation in which a seizure could cause serious harm to self or others, including - but not limited to - working on heights or with heavy machinery and working over hot surfaces. Driving - The patient was advised not to drive after a seizure until on a stable regimen and at least 6 months seizure free per [place].  ?  ?  Time-based billing: Of the 30 minute encounter, > 50% of the time was spent on counseling as above.  ?  Waldron Session, M.D.  [facility]  [facility]    -----------------------------------------------------------------------------------------------------------------------------------------    Chief complaint: seizures    Sources of history: patient.    Interval History:  Patient had been doing well initially, however, had a fall around 3/12On 01/08/22, patient underwent right temporal crani with lesion resection with Dr. Hyacinth Meeker. Pathology was consistent with recurrent high-grade glioma. Main symptoms continue to be headaches and fatigue, which she is concerned may be both from the underlying disease as well as the many medications she is on for management. She has not relayed any breakthrough seizures.  Previous History (updated and reviewed):  Patient is a?51 y.o.?female?with hx of globlastoma?presenting?s/p spell concerning for seizure. Per review of witnessed accounts, patient was on her way to the bathroom when she acutely lost consciousness and fell to the floor. She started having twitching on the right side of the face followed by leg shaking. She had her eyes open, lips moving.?She was guided to a chair where she continued to have blank staring, periodic tremors. She started returning to baseline, however, amnestic to the event.??  ?  Per patient, has a funny feeling that comes over her. She starts having a sharp pain going across the front and back of the head. She is awake of what is going on around her, but unable to speak. Has had head deviation to the left side with these as well. Since last seen in the ER on 11/17, she has had staring but no progression. ?  ?  Of note, patient had increased wellbutrin at the beginning of the month to 300mg  (from 150mg  BID). 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 States that she has been having poor sleep over the last several months, 2/2 to medical condition.   ?  Previously on Keppra 750mg  twice daily, Depakote 1000mg  TID. Diagnosed with glioblastoma in 2019.?On last visit, started on zonisamide 300mg  qhs.     Last clinic visit: No breakthorugh events since last evaluation. Has been tolerating the lamotrigine and zonisamide quite well. Medication regimen has been simplified with consolidating her lamotrigine pills. Main complaint is intermittent double vision. This has been a chronic problem. Patient has been spending more time outside to center herself, which has been helping. Per patient, has not been following with oncology for some time.   ?  ?  Epilepsy risk factors:  CNS Tumors  ?  ===============================================  ?  SEIZURE/ EVENT TYPES/ DESCRIPTION (Semiology)/ FREQUENCY  Type 1:  Description:?Funny feeling that comes over her. She starts having a sharp pain going across the front and back of the head. She is awake of what is going on around her, but unable to speak. Has?witnessed lip smacking?had head deviation to the left side?and diffuse?  Alteration of awareness:?Partial  Duration:?Variable  Previous frequency:?1 event in 8/22 and 2 events on 08/12/21  Current frequency: None since 08/12/21      AEDs  Current AEDs:?Zonisamide 400mg  QHS, 150mg  BID lamotrigine  Past failed AEDs:?Keppra 750mg  BID (agitation), VPA 1000mg  TID?(platelet dysfunction)  ?  EVALUATION/TESTING:  Physiological Testing  Routine EEG:?-  EMU/ vEEG:?-  ?  Basic Structural Imaging:  Outside MRI (05/2021):        MRI Brain (11/12/2018):  1. Overall decrease size of the right parieto-occipital resection and treatment cavity compared to September 13, 2018. There continues to be complex heterogeneous debris or soft tissue within the inferior portion of the cavity without significant enhancement. Small amount of thin peripheral cavity enhancement. This study will serve as a good baseline for subsequent follow-up imaging.  2. Decreased FLAIR hyperintensity/edema surrounding the resection cavity.  3. No new signal abnormality or enhancement.  4. Small amount of scarring or nonocclusive thrombus within the mid to lateral right transverse sinus.  5. Bilateral perinasal sinus inflammatory mucosal thickening and fluid.    MRI Brain (01/08/2022):  IMPRESSION   1. New large right temporal lobectomy.   2. No adjacent abnormal contrast enhancement visible.   3. Old right parieto-occipital resection site.   4. FLAIR signal abnormality in right occipital, parietal and posterior   frontal lobes  extending into internal and external capsules and residual   temporal lobe.   5. Narrowing of right lateral ventricle and mild midline shift to the   left.     ===============================================      OTHER HISTORY  Past Medical History:  Medical History:   Diagnosis Date   ? ADHD    ? Depression    ? Disorder of thyroid gland    ? Hypertension        Past Surgical History:  Surgical History:   Procedure Laterality Date   ? Right craniotomy for resection of tumor Right 08/27/2018    Performed by Theotis Barrio, MD at [facility]   ? MICROSURGICAL TECHNIQUES - REQUIRING OPERATING MICROSCOPE USE N/A 08/27/2018    Performed by The[facility]is [facility]rio, MD at [facility]   ? STEREOTACTIC COMPUTER-ASSISTED CRANIAL PROCEDURE - INTRADURAL N/A 08/27/2018    Performed by The[facility]is [facility]rio, MD at [facility]   ? ULTRASOUND GUIDANCE - INTRAOPERATIVE  08/27/2018    Performed by The[facility]is [facility]rio, MD at [facility]   ? CRANIECTOMY/ BONE FLAP CRANIOTOMY EXCISION SUPRATENTORIAL BRAIN TUMOR Right 08/28/2018    Performed [facility] at [facility]   ? CRANIECTOMY/ BONE FLAP CRANIOTOMY EXCISION SUPRATENTORIAL BRAIN TUMOR Right 01/08/2022    Performed by Evaristo Bury, MD at [facility] OR   ? CHOLECYSTECTOMY     ? HX APPENDECTOMY     ? HX HYSTERECTOMY     ? HX JOINT REPLACEMENT     ? HYSTERECTOMY         Family History:  Family History   Problem Relation Age of Onset   ? Blindness Neg Hx    ? Glaucoma Neg Hx    ? Macular Degen Neg Hx        Social History:  Social History     Tobacco Use   ? Smoking status: Every Day     Packs/day: 0.25     Years: 30.00     Pack years: 7.50     Types: Cigarettes   ? Smokeless tobacco: Never   Vaping Use   ? Vaping Use: Never used   Substance Use Topics   ? Alcohol use: Yes     Comment: Seldom   ? Drug use: Never       Allergies:  Allergies   Allergen Reactions   ? Morphine ANAPHYLAXIS   ? Codeine RASH   ? Erythromycin SHORTNESS OF BREATH   ? Latex RASH   ? Keppra [Levetiracetam] AGITATION   ? Nsaids (Non-Steroidal Anti-Inflammatory Drug) SEE COMMENTS     History of GI bleed - has been instructed to avoid NSAID's       Medications:    Current Outpatient Medications:   ?  acetaminophen (TYLENOL) 325 mg tablet, Take two tablets by mouth every 6 hours as needed for Pain., Disp: , Rfl:   ?  apixaban (ELIQUIS) 5 mg tablet, Take one tablet by mouth twice daily. Okay to restart on 01/22/22, Disp: 60 tablet, Rfl: 0  ?  calcium carbonate (TUMS) 500 mg (200 mg elemental calcium) chewable tablet, Chew one tablet by mouth as Needed., Disp: , Rfl:   ?  cephalexin (KEFLEX) 500 mg capsule, Take one capsule by mouth four times daily for 10 days., Disp: 40 capsule, Rfl: 0  ?  duloxetine DR (CYMBALTA) 60 mg capsule, Take one capsule by mouth daily., Disp: 30 capsule, Rfl: 1  ?  escitalopram oxalate (LEXAPRO) 20 mg tablet, Take one tablet  by mouth daily. Added per 11/22/19 records from [place] pharmacy, Disp: , Rfl:   ?  lamoTRIgine (LAMICTAL) 150 mg tablet, Take one tablet by mouth twice daily for 180 days. Take one tablet twice a day with 25mg  tabs for a total of 125mg ., Disp: 180 tablet, Rfl: 1  ?  levothyroxine (SYNTHROID) 200 mcg tablet, Take one tablet by mouth daily 30 minutes before breakfast., Disp: , Rfl:   ?  lidocaine/prilocaine (EMLA) 2.5/2.5 % topical cream, Apply one g topically to affected area as Needed., Disp: , Rfl:   ?  losartan-hydrochlorothiazide (HYZAAR) 100-12.5 mg tablet, Take one tablet by mouth every morning., Disp: , Rfl:   ?  oxyCODONE (ROXICODONE) 5 mg tablet, Take one tablet to two tablets by mouth every 6 hours as needed for Pain., Disp: 30 tablet, Rfl: 0  ?  oxyCODONE-acetaminophen (PERCOCET) 7.5-325 mg tablet, TAKE 1 TABLET BY MOUTH TWICE DAILY AS NEEDED FOR PAIN, Disp: , Rfl:   ?  pregabalin (LYRICA) 75 mg capsule, Take one capsule by mouth twice daily., Disp: , Rfl:   ?  vitamins, multiple cap, Take one capsule by mouth daily., Disp: , Rfl:   ?  zonisamide (ZONEGRAN) 100 mg capsule, Take four capsules by mouth daily for 180 days. (Patient taking differently: Take four capsules by mouth at bedtime daily.), Disp: 360 capsule, Rfl: 1    Review of Systems:  The following systems were reviewed and were negative except as mentioned elsewhere:    Constitutional:  No fever/chills/sweat, weight loss. +tiredness/fatigue. +headache  Eyes:  No reduced vision or blurriness, double vision, or droopy eyelids  ENT:  No hearing loss, ringing in the ears, vertigo, or hoarseness  Cardiovascular:  No chest pain/angina or palpitations  Respiratory:  No shortness of breath or cough  Gastrointestinal:  No abdominal pain, nausea and/or vomiting, diarrhea, or constipation  Genitourinary:  No pain with urination, excessive urination, or incontinence  Musculoskeletal:  +back pain, no neck pain, joint pain/redness/swelling, or myalgia  Skin:  No jaundice, rash, or change in sweating  Hematologic/ Lymphatic:  No anemia, easy bruising, bleeding, or enlarged lymph nodes  Endocrine:  No temperature intolerance, polyuria, or polydipsia  Allergic/ Immunological:  No severe allergic reaction  Psychiatric:  No depression or anxiety     PHYSICAL EXAMINATION (Televisit):  Vitals: There were no vitals filed for this visit.   General:  No scleral icterus  Aerating Well    Neurologic Exam:  Mental status:  ? Alert and oriented x 3  ? Recent and remote memory intact  ? Concentration and attention intact  ? Fund of knowledge intact; mood and affect normal  ? Language including naming, repetition, comprehension is normal and spontaneous speech is normal    Cranial nerves:  ? Ocular motility full  ? Face symmetric and strength normal  ? Hearing intact and appropriately responsive to voice  ? Shoulder shrug is symmetric    Motor:  ? Muscle strength at least 4/5 and antigravity diffusely. Unable to perform confrontational testing    Reflexes, sensory exam, coordination, gait:  ? Unable to test on tele visit    ===========================================================================

## 2022-02-07 ENCOUNTER — Encounter: Admit: 2022-02-07 | Discharge: 2022-02-07 | Payer: MEDICARE

## 2022-02-12 NOTE — Progress Notes
[facility]  Brain and Spine Tumor Program        Neuro-oncology Clinic Progress Note    Patient Name: Andrea [name]  DOB: 23-Jul-1971  MRN: [id number]  DOS: 02/13/2022      Subjective:   Andrea [name] is a 51 y.o. 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 ?Coronavirus is limiting activity. ?Dense left hemianopsia persists. ?Her brief treatment history includes presentation with a few weeks of progressive confusion, bumping into things. ?Imaging demonstrates a right inferior parietal enhancing mass with surrounding edema. ?She underwent radiation with concurrent temozolomide (no depakote) ending in early January 2020. ?Began adjuvant temolomide, adjusted to BID dosed due to her comorbidities.??She has completed her 12 cycles of twice daily adjusted adjuvant p.o. temozolomide back in May 2021.  ?  She was last seen by back in October 15, 2021 with our PA-C.  ?  Recently, the patient presented to [facility] with new onset left-sided weakness, headache and recent fall.  MRI reveals progressive right temporal enhancing lesion.?    Unfortunately, the patient has had a recurrence in January 08, 2022, underwent STR ( subtotal resection) revealing similar IDH wild-type methylated glioblastoma recurrence with relatively longer time to recurrence.      Past Medical History:     Medical History:   Diagnosis Date   ? ADHD    ? Depression    ? Disorder of thyroid gland    ? Hypertension      Surgical History:   Procedure Laterality Date ? Right craniotomy for resection of tumor Right 08/27/2018    Performed by Theotis Barrio, MD at [facility] OR   ? MICROSURGICAL TECHNIQUES - REQUIRING OPERATING MICROSCOPE USE N/A 08/27/2018    Performed by Theotis Barrio, MD at [facility] OR   ? STEREOTACTIC COMPUTER-ASSISTED CRANIAL PROCEDURE - INTRADURAL N/A 08/27/2018    Performed by Theotis Barrio, MD at [facility] OR   ? ULTRASOUND GUIDANCE - INTRAOPERATIVE  08/27/2018    Performed by Theotis Barrio, MD at [facility] OR   ? CRANIECTOMY/ BONE FLAP CRANIOTOMY EXCISION SUPRATENTORIAL BRAIN TUMOR Right 08/28/2018    Performed by Theotis Barrio, MD at [facility] OR   ? CRANIECTOMY/ BONE FLAP CRANIOTOMY EXCISION SUPRATENTORIAL BRAIN TUMOR Right 01/08/2022    Performed by Evaristo Bury, MD at [facility] OR   ? CHOLECYSTECTOMY     ? HX APPENDECTOMY     ? HX HYSTERECTOMY     ? HX JOINT REPLACEMENT     ? HYSTERECTOMY       Social History     Socioeconomic History   ? Marital status: Widowed   ? Number of children: 1   Tobacco Use   ? Smoking status: Every Day     Packs/day: 0.25     Years: 30.00     Pack years: 7.50     Types: Cigarettes   ? Smokeless tobacco: Never   Vaping Use   ? Vaping Use: Never used   Substance and Sexual Activity   ? Alcohol use: Yes     Comment:  Seldom   ? Drug use: Never       Current Medications:  ? acetaminophen (TYLENOL) 325 mg tablet Take two tablets by mouth every 6 hours as needed for Pain.   ? apixaban (ELIQUIS) 5 mg tablet Take one tablet by mouth twice daily. Okay to restart on 01/22/22   ? calcium carbonate (TUMS) 500 mg (200 mg elemental calcium) chewable tablet Chew one tablet by mouth as Needed.   ? duloxetine DR (CYMBALTA) 60 mg capsule Take one capsule by mouth daily.   ? lamoTRIgine (LAMICTAL) 150 mg tablet Take one tablet by mouth twice daily for 180 days. Take one tablet twice a day with 25mg  tabs for a total of 125mg .   ? levothyroxine (SYNTHROID) 200 mcg tablet Take one tablet by mouth daily 30 minutes before breakfast.   ? lidocaine/prilocaine (EMLA) 2.5/2.5 % topical cream Apply one g topically to affected area as Needed.   ? losartan-hydrochlorothiazide (HYZAAR) 100-12.5 mg tablet Take one tablet by mouth every morning.   ? oxyCODONE (ROXICODONE) 5 mg tablet Take one tablet to two tablets by mouth every 6 hours as needed for Pain.   ? oxyCODONE-acetaminophen (PERCOCET) 7.5-325 mg tablet TAKE 1 TABLET BY MOUTH TWICE DAILY AS NEEDED FOR PAIN   ? pregabalin (LYRICA) 75 mg capsule Take one capsule by mouth twice daily.   ? vitamins, multiple cap Take one capsule by mouth daily.   ? zonisamide (ZONEGRAN) 100 mg capsule Take four capsules by mouth at bedtime daily for 180 days.       Allergies:  Allergies   Allergen Reactions   ? Morphine ANAPHYLAXIS   ? Codeine RASH   ? Erythromycin SHORTNESS OF BREATH   ? Latex RASH   ? Keppra [Levetiracetam] AGITATION   ? Nsaids (Non-Steroidal Anti-Inflammatory Drug) SEE COMMENTS     History of GI bleed - has been instructed to avoid NSAID's       Vitals:  There were no vitals filed for this visit.  There is no height or weight on file to calculate BSA.    Exam   PREVIOUS IN PERSON    Domains    Gait 1 - Abnormal but walks without assistance    Strength 1- Movement present but decreased against resistance   Ataxia (upper extremities) 1- Able to finger to nose touch without difficulty   Sensation 0- Normal   Visual fields 1- Inconsistent or equivocal partial hemianopsia (>or= quadrantanopsia)   Facial strength 0- Normal   Language 2- Abnormal and difficulty conveying meaning to examiner   Level of consciousness 0- Normal   Behavior 0- Normal     TOTAL NANO SCORE: 6    ECOG PS = 2    Pain = 3/10    Well nourished, well developed, in no acute distress, sequelae of steroid use noted with Cushingoid appearance.    Mood and affect appear appropriate for the situation  Cognition appears slow, expressive aphasia  Somewhat Ambulatory at home    CN grossly intact 2-12 with the exception of dense hemianopsia  Strength appears intact in all 4 extremities  Balance (ambulation) is wide-based      Pathological data:  Final Diagnosis:     A. Brain, right temporal tumor, right supratentorial craniotomy: ?   Consistent with recurrent high-grade glioma     B. Brain, right temporal tumor, right supratentorial craniotomy: ?   High-grade glioma, consistent with recurrence of glioblastoma,   IDH-wildtype, CNS WHO grade 4, MGMT promoter methylation present  See comment. ?     Comment:   Sections show a hypercellular glial neoplasm with atypia, mitotic   activity, microvascular proliferation & (tumor) necrosis. The tumor has   been previously characterized as IDH1 & 2 wild type (limited panel in   2019) & MGMT promoter methylation present. Please see [id number].     Comprehensive next-generation sequencing is pending on this specimen.   Please see medical record and/or addendum for results. ?         RADIOLOGICAL DATA:         IMPRESSION     1. New large right temporal lobectomy.   2. No adjacent abnormal contrast enhancement visible.   3. Old right parieto-occipital resection site.   4. FLAIR signal abnormality in right occipital, parietal and posterior   frontal lobes extending into internal and external capsules and residual   temporal lobe.   5. Narrowing of right lateral ventricle and mild midline shift to the   left.       ?Finalized by Thayer Headings, M.D. on 01/09/2022 10:01 AM. Dictated by Thayer Headings, M.D. on 01/09/2022 8:33 AM.     IMPRESSION/RECOMMENDATION:      Patient Active Problem List    Diagnosis Date Noted   ? Neoplasm of brain causing mass effect on adjacent structures (HCC) 01/05/2022   ? Partial symptomatic epilepsy with complex partial seizures, not intractable, without status epilepticus (HCC) 12/30/2019   ? 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 Glioblastoma (HCC) 09/13/2018   ? Impaired mobility and ADLs 08/31/2018   ? Impaired cognition 08/31/2018   ? HTN (hypertension) 08/31/2018   ? Hypothyroid 08/31/2018   ? 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  Imaging demonstrates a right inferior parietal enhancing mass with surrounding edema.    - November 2019 s/p craniotomy subtotal resection with Dr. Theotis Barrio with post op MRI showing residual peripheral enhancement along the anterior inferior origin of the resection bed  - January 2020 completed radiation with concurrent temozolomide (no depakote) ending in early January 2020.   - May 2021 completed 12 cycles of adjuvant temozolomide, adjusted to BID dosed due to her comorbidities.   -March 2023-recurred, high-grade glioma, IDH wild-type, methylation present    At this point, given the methylation status long time to recurrence, one of the options is to proceed with dose dense p.o. temozolomide at 50 mg/m?Marland Kitchen    It should be noted that in NGS, a high TMB approaching to 40 muts was noted, possible utilization of an ICI like pembrolizumab in the recurrent setting.    -We will order baseline CBC CMP and TSH    -Her total daily p.o. temozolomide dosed corrected to BSA of 2.5 will be 125 mg daily      The case has been extensively discussed with the patient and the family in the room who verbalized understanding, questions were answered and a total of 45 minutes was spent on this day discussing and preparing about the diagnosis,prognosis, treatment options and toxicity profile as well as long-term outcomes.     Gita Kudo, MD   Neuro-Oncology/Medical Oncology

## 2022-02-13 ENCOUNTER — Encounter: Admit: 2022-02-13 | Discharge: 2022-02-13 | Payer: MEDICARE

## 2022-02-13 ENCOUNTER — Ambulatory Visit: Admit: 2022-02-13 | Discharge: 2022-02-14 | Payer: MEDICARE

## 2022-02-13 DIAGNOSIS — D496 Neoplasm of unspecified behavior of brain: Secondary | ICD-10-CM

## 2022-02-13 DIAGNOSIS — C719 Malignant neoplasm of brain, unspecified: Secondary | ICD-10-CM

## 2022-02-13 MED ORDER — IMS MIXTURE TEMPLATE
50 mg/m2 | Freq: Every evening | ORAL | 0 refills | Status: CN
Start: 2022-02-13 — End: ?

## 2022-02-13 MED ORDER — TEMOZOLOMIDE 20 MG PO CAP
20 mg | ORAL_CAPSULE | Freq: Every evening | ORAL | 0 refills | Status: AC
Start: 2022-02-13 — End: ?

## 2022-02-13 MED ORDER — TEMOZOLOMIDE 5 MG PO CAP
5 mg | ORAL_CAPSULE | Freq: Every evening | ORAL | 0 refills | Status: AC
Start: 2022-02-13 — End: ?
  Filled 2022-02-17: 28d supply

## 2022-02-13 MED ORDER — OXYCODONE 5 MG PO TAB
5-10 mg | ORAL_TABLET | ORAL | 0 refills | 6.00000 days | Status: AC | PRN
Start: 2022-02-13 — End: ?

## 2022-02-13 MED ORDER — TEMOZOLOMIDE 100 MG PO CAP
100 mg | ORAL_CAPSULE | Freq: Every evening | ORAL | 0 refills | Status: AC
Start: 2022-02-13 — End: ?

## 2022-02-13 NOTE — Progress Notes
Date of Service: 02/13/2022    Subjective:          History obtained from    Andrea [name] is a 51 y.o. female.    History of Present Illness  Andrea [name] was 1 month status post lateral temporal lobectomy for glioblastoma resection.  This time the patient is overall doing well she notes right-sided jaw pain.  She also notes headaches that are 5-10 out of 10 depending on the day.  Otherwise she complains of incision itchiness.  She does note some memory deficits but otherwise notes her recovery has been smooth.      Objective:         ? acetaminophen (TYLENOL) 325 mg tablet Take two tablets by mouth every 6 hours as needed for Pain.   ? apixaban (ELIQUIS) 5 mg tablet Take one tablet by mouth twice daily. Okay to restart on 01/22/22   ? calcium carbonate (TUMS) 500 mg (200 mg elemental calcium) chewable tablet Chew one tablet by mouth as Needed.   ? duloxetine DR (CYMBALTA) 60 mg capsule Take one capsule by mouth daily.   ? lamoTRIgine (LAMICTAL) 150 mg tablet Take one tablet by mouth twice daily for 180 days. Take one tablet twice a day with 25mg  tabs for a total of 125mg .   ? levothyroxine (SYNTHROID) 200 mcg tablet Take one tablet by mouth daily 30 minutes before breakfast.   ? lidocaine/prilocaine (EMLA) 2.5/2.5 % topical cream Apply one g topically to affected area as Needed.   ? losartan-hydrochlorothiazide (HYZAAR) 100-12.5 mg tablet Take one tablet by mouth every morning.   ? oxyCODONE (ROXICODONE) 5 mg tablet Take one tablet to two tablets by mouth every 6 hours as needed for Pain.   ? oxyCODONE-acetaminophen (PERCOCET) 7.5-325 mg tablet TAKE 1 TABLET BY MOUTH TWICE DAILY AS NEEDED FOR PAIN   ? pregabalin (LYRICA) 75 mg capsule Take one capsule by mouth twice daily.   ? vitamins, multiple cap Take one capsule by mouth daily.   ? zonisamide (ZONEGRAN) 100 mg capsule Take four capsules by mouth at bedtime daily for 180 days.     Vitals:    02/13/22 1152   PainSc: Five   Weight: 136.1 kg (300 lb)   Height: 167.6 cm (5' 6)     Body mass index is 48.42 kg/m?Marland Kitchen     Physical Exam  Alert and appropriate  Incision healing well      Imaging:  None new       Assessment and Plan:  Andrea [name] is a 51 year old female has undergone right-sided parietal tumor resection in the past and most recently right-sided temporal lobe resection for GBM.  She is recovering well at this time.  She does have primarily pain complaints for which we will provide a refill of her oxycodone prescription.  Otherwise she will be following with Dr. Chanda Busing for her medical neuro-oncology.  I will plan to see the patient back at 3 months after the surgery for further follow-up.    Evaristo Bury, MD

## 2022-02-13 NOTE — Telephone Encounter
Called patient and scheduled 6 wk follow up with Dr. Charise Killian w/MRI. Patient requested to do MRI locally. Patient was advised will reach out to [facility] and follow up in can be done at [place]. Scheduler advised will leave MRI scheduled as is until local scans scheduled in case if difficulty scheduling.

## 2022-02-13 NOTE — Progress Notes
Plan of care for appointment on 02/13/2022- MRI in 6 weeks with follow up with Dr. Charise Killian, starting daily chemo, lab orders faxed to [facility] [phone]. Fax was success.

## 2022-02-13 NOTE — Progress Notes
Oral Chemotherapy: Pharmacist Initial Assessment  Temozolomide (Temodar?)    Indication and Regimen Assessment  Andrea [name] is [name] 51 y.o. female with [name] diagnosis of Glioblastoma and is planned to be treated with Temozolomide (Temodar?).  The dosing regimen of Temodar 50 mg/m2 (125 mg or one 100 mg cap + one 20 mg cap + one 5 mg cap) by mouth once daily is appropriate for [name]. It is planned to continue until progression or unacceptable toxicity. The patient's ability to self-administer their medication will be assessed during patient education.    Therapy Goals Assessment and Treatment Regimens  Therapy goal(s) may be referenced in the [facility] treatment plan properties    Active treatment regimens:    Active Treatment Plans for Andrea [name], Andrea [name]     ONCOLOGY 1: OP BRAIN TEMOZOLOMIDE (ORAL) 50 MG/M2/DAY     Current day: Day 1, Cycle 1  (Planned for 02/24/2022)    Following planned day: Day 1, Cycle 2  (Planned for 03/26/2022)              Past treatment regimens:    Past Treatment Plans    SUPPORTIVE CARE   Plan Name Cycles Start Date Discontinue Date Discontinue Reason Discontinue User    OP SUPPORT PENTAMIDINE INH 1 of 12 cycles started 09/21/2018  11/18/2018 Therapy Complete Bartholome Bill, MD   ONCOLOGY 2   Plan Name Cycles Start Date Discontinue Date Discontinue Reason Discontinue User    OP BRAIN TEMOZOLOMIDE (ORAL) + CELECOXIB 11 of 12 cycles started 01/25/2019  03/23/2020 Therapy Complete Hoelscher, Lafonda Mosses, PHARMD    OP BRAIN TEMOZOLOMIDE (ORAL) - ADJUVANT / MAINTENANCE PHASE 1 of 6 cycles planned 12/06/2018  01/25/2019 Not Tolerated Bartholome Bill, MD    OP BRAIN TEMOZOLOMIDE (ORAL) + XRT - CONCOMITANT PHASE Treatment not started 09/20/2018  12/06/2018 Therapy Complete Bartholome Bill, MD           Therapy and Dose Appropriateness Assessment  Relevant molecular studies:   Wt Readings from Last 1 Encounters:   02/13/22 136.1 kg (300 lb)     Estimated body surface area is 2.52 meters squared as calculated from the following:    Height as of an earlier encounter on 02/13/22: 167.6 cm (5' 6).    Weight as of an earlier encounter on 02/13/22: 136.1 kg (300 lb).      Baseline Labs  CBC w/Diff    Lab Results   Component Value Date/Time    WBC 11.4 (H) 01/11/2022 04:05 AM    RBC 3.63 (L) 01/11/2022 04:05 AM    HGB 10.5 (L) 01/11/2022 04:05 AM    HCT 31.7 (L) 01/11/2022 04:05 AM    MCV 87.4 01/11/2022 04:05 AM    MCH 29.0 01/11/2022 04:05 AM    MCHC 33.3 01/11/2022 04:05 AM    RDW 15.1 (H) 01/11/2022 04:05 AM    PLTCT 265 01/11/2022 04:05 AM    MPV 7.4 01/11/2022 04:05 AM    Lab Results   Component Value Date/Time    NEUT 85 (H) 01/11/2022 04:05 AM    ANC 9.68 (H) 01/11/2022 04:05 AM    LYMA 11 (L) 01/11/2022 04:05 AM    ALC 1.27 01/11/2022 04:05 AM    MONA 4 01/11/2022 04:05 AM    AMC 0.41 01/11/2022 04:05 AM    EOSA 0 01/11/2022 04:05 AM    AEC 0.00 01/11/2022 04:05 AM    BASA 0 01/11/2022 04:05 AM    ABC 0.03 01/11/2022  04:05 AM        Comprehensive Metabolic Profile    Lab Results   Component Value Date/Time    NA 140 01/11/2022 04:05 AM    K 4.0 01/11/2022 04:05 AM    CL 104 01/11/2022 04:05 AM    CO2 26 01/11/2022 04:05 AM    GAP 10 01/11/2022 04:05 AM    BUN 21 01/11/2022 04:05 AM    CR 0.79 01/11/2022 04:05 AM    GLU 120 (H) 01/11/2022 04:05 AM    Lab Results   Component Value Date/Time    CA 8.8 01/11/2022 04:05 AM    PO4 3.8 01/09/2022 02:10 AM    ALBUMIN 3.2 (L) 01/11/2022 04:05 AM    TOTPROT 6.2 01/11/2022 04:05 AM    ALKPHOS 39 01/11/2022 04:05 AM    AST 19 01/11/2022 04:05 AM    ALT 23 01/11/2022 04:05 AM    TOTBILI 0.4 01/11/2022 04:05 AM    GFR >60 09/13/2019 10:40 PM    GFRAA >60 09/13/2019 10:40 PM      Creatinine clearance cannot be calculated (Patient's most recent lab result is older than the maximum 30 days allowed.)    Safety Assessment  The following safety precautions to this therapy were reviewed:  ? Bone marrow suppression: Prior to dosing, ANC should be ?1,500/mm3 and platelets ?100,000/mm3  ? Gastrointestinal toxicity: antiemetics are recommended  ? Hepatotoxicity  ? Pneumocystis jirovecii pneumonia (PCP) may occur; risk is increased in those receiving steroids or longer dosing regimens.    Contraindication and/or Safety precautions for this medication have been reviewed:  ? No concerns have been identified.  Confirming baseline labs with Dr. Dian Situ clinic.  The last labs on file are from 01/11/22, but meet treatment parameters.  ? Hypersensitivity (eg, allergic reaction, anaphylaxis, urticaria, Stevens-Johnson syndrome, toxic epidermal necrolysis) to temozolomide or any component of the formulation.  ? Hypersensitivity to dacarbazine (both drugs are metabolized to MTIC).    Reproductive Risk  ? The patient's pregnancy status was assessed. As patient is [name] not of child-bearing potential, education was not applicable.     Risk Evaluation and Mitigation Strategy (REMS)  ? No REMS is required for this medication.    Medication Reconciliation and Drug/Food Interaction Assessment  [name] comprehensive medication history and reconciliation was performed. The medication list was updated, and the patient's current medication list is included below. The patient was instructed to speak with her health care provider and/or the oral chemotherapy pharmacist before starting any new drug, including prescription or over the counter, natural / herbal products, or vitamins.      Home Medications    Medication Sig   acetaminophen (TYLENOL) 325 mg tablet Take two tablets by mouth every 6 hours as needed for Pain.   apixaban (ELIQUIS) 5 mg tablet Take one tablet by mouth twice daily. Okay to restart on 01/22/22   calcium carbonate (TUMS) 500 mg (200 mg elemental calcium) chewable tablet Chew one tablet by mouth as Needed.   duloxetine DR (CYMBALTA) 60 mg capsule Take one capsule by mouth daily.   lamoTRIgine (LAMICTAL) 150 mg tablet Take one tablet by mouth twice daily for 180 days. Take one tablet twice [name] day with 25mg  tabs for [name] total of 125mg .   levothyroxine (SYNTHROID) 200 mcg tablet Take one tablet by mouth daily 30 minutes before breakfast.   lidocaine/prilocaine (EMLA) 2.5/2.5 % topical cream Apply one g topically to affected area as Needed.   losartan-hydrochlorothiazide (HYZAAR) 100-12.5 mg tablet Take  one tablet by mouth every morning.   oxyCODONE (ROXICODONE) 5 mg tablet Take one tablet to two tablets by mouth every 6 hours as needed for Pain.   oxyCODONE-acetaminophen (PERCOCET) 7.5-325 mg tablet TAKE 1 TABLET BY MOUTH TWICE DAILY AS NEEDED FOR PAIN   pregabalin (LYRICA) 75 mg capsule Take one capsule by mouth twice daily.   temozolomide (TEMODAR) 100 mg capsule Take one capsule (100 mg) by mouth at bedtime daily with 2 other temozolomide prescriptions for 125 mg per dose.  Indications: glioblastoma multiforme   temozolomide (TEMODAR) 20 mg capsule Take one capsule (20 mg) by mouth at bedtime daily with 2 other temozolomide prescriptions for 125 mg per dose.  Indications: glioblastoma multiforme   temozolomide (TEMODAR) 5 mg capsule Take one capsule (5 mg) by mouth at bedtime daily with 2 other temozolomide prescriptions for 125 mg per dose.  Indications: glioblastoma multiforme   vitamins, multiple cap Take one capsule by mouth daily.   zonisamide (ZONEGRAN) 100 mg capsule Take four capsules by mouth at bedtime daily for 180 days.     Allergies   Allergen Reactions   ? Morphine ANAPHYLAXIS   ? Codeine RASH   ? Erythromycin SHORTNESS OF BREATH   ? Latex RASH   ? Keppra [Levetiracetam] AGITATION     ? No significant drug-drug interactions were identified.   ? Drug-food interactions were evaluated: doses should be administered consistently with or without food    Past Medical History and Comorbidities   Patient Active Problem List   Diagnosis   ? Impaired mobility and ADLs   ? Impaired cognition   ? HTN (hypertension)   ? Hypothyroid   ? Depression   ? Glioblastoma (HCC)   ? Homonymous hemianopia, left   ? Keratoconjunctivitis sicca (HCC)   ? Obesity, morbid (more than 100 lbs over ideal weight or BMI > 40) (HCC)   ? Partial symptomatic epilepsy with complex partial seizures, not intractable, without status epilepticus (HCC)   ? Neoplasm of brain causing mass effect on adjacent structures [facility])       Additional comorbidities  Not at this time    Immunization Assessment   Immunization History   Administered Date(s) Administered   ? COVID-19 (MODERNA), mRNA vacc, 100 mcg/0.5 mL (PF) 12/13/2020   ? DTaP vaccine IM (Infanrix) 07/31/1970, 02/21/1971, 03/18/1971, 03/16/1972   ? Flu Vaccine =>6 Months Quadrivalent PF 09/04/2018   ? Flu vaccine, inj unspecified (Historical) 09/04/2018, 08/22/2019   ? HEPATITIS B vaccine, unspecified (Historical) 09/15/1989, 10/29/1989, 11/26/1990, 06/11/2004   ? MMR Vaccine 05/01/1971, 05/13/1975, 12/25/2004   ? OPV 07/31/1970, 02/21/1971, 02/22/1972, 01/01/1983   ? Pneumococcal Vaccine(13-Val Peds/immunocompromised adult) 09/20/2018   ? Td vaccine, unspecified (Historical) 02/05/1992, 06/05/2003   ? Tdap Vaccine 04/23/2017     Vaccine history and recommended vaccinations were reviewed and will be recommended as appropriate. The patient will be reminded about the importance of receiving an annual influenza vaccine as indicated.    Initial therapy assessment has been completed and the patient will be contacted to complete education on their regimen.       Ramond Craver, [facility]  Clinical Oncology Pharmacist  02/13/22

## 2022-02-14 ENCOUNTER — Encounter: Admit: 2022-02-14 | Discharge: 2022-02-14 | Payer: MEDICARE

## 2022-02-17 ENCOUNTER — Encounter: Admit: 2022-02-17 | Discharge: 2022-02-17 | Payer: MEDICARE

## 2022-02-17 MED FILL — TEMOZOLOMIDE 20 MG PO CAP: 20 mg | ORAL | 28 days supply | Status: CN

## 2022-02-17 NOTE — Progress Notes
No prior authorization was required for temozolomide for Andrea [name].  The copay is $268.    Andrea [name] has stated this copay is not affordable.  Pharmacy will work on obtaining copay assistance.  Pharmacy will continue to follow.  If no assistance is available, the specialty pharmacy will contact the provider's nurse.    [place] Patient Advocate  [phone]

## 2022-02-18 ENCOUNTER — Encounter: Admit: 2022-02-18 | Discharge: 2022-02-18 | Payer: MEDICARE

## 2022-02-18 DIAGNOSIS — C719 Malignant neoplasm of brain, unspecified: Secondary | ICD-10-CM

## 2022-02-18 NOTE — Progress Notes
[facility]  Brain Tumor Program    Neuro-oncology Clinic Progress Note    Patient Name: Andrea [name]  DOB: [birth year]  MRN: [id number]  DOS: 02/18/2022      Subjective:   Andrea [name] is a 51 y.o. 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 ?Coronavirus is limiting activity. ?Dense left hemianopsia persists. ?Her brief treatment history includes presentation with a few weeks of progressive confusion, bumping into things. ?Imaging demonstrates a right inferior parietal enhancing mass with surrounding edema. ?She underwent radiation with concurrent temozolomide (no depakote) ending in early January 2020. ?Began adjuvant temolomide, adjusted to BID dosed due to her comorbidities.??She has completed her 12 cycles of twice daily adjusted adjuvant p.o. temozolomide back in May 2021.  ?  She was last seen by back in October 15, 2021 with our PA-C.  ?  Recently, the patient presented to [facility] with new onset left-sided weakness, headache and recent fall.  MRI reveals progressive right temporal enhancing lesion.?    Unfortunately, the patient has had a recurrence in January 08, 2022, underwent STR ( subtotal resection) revealing similar IDH wild-type methylated glioblastoma recurrence with relatively longer time to recurrence.    Interval History    Has recovered from surgery well.  Continues to have some post op fatigue otherwise feels she's doing well.     Past Medical History:     Medical History:   Diagnosis Date ? ADHD    ? Depression    ? Disorder of thyroid gland    ? Hypertension      Surgical History:   Procedure Laterality Date   ? Right craniotomy for resection of tumor Right 08/27/2018    Performed by Theotis Barrio, MD at [facility]   ? MICROSURGICAL TECHNIQUES - REQUIRING OPERATING MICROSCOPE USE N/A 08/27/2018    Performed by Theotis Barrio, MD at [facility]   ? STEREOTACTIC COMPUTER-ASSISTED CRANIAL PROCEDURE - INTRADURAL N/A 08/27/2018    Performed by Theotis Barrio, MD at [facility]   ? ULTRASOUND GUIDANCE - INTRAOPERATIVE  08/27/2018    Performed by Theotis Barrio, MD at [facility]   ? CRANIECTOMY/ BONE FLAP CRANIOTOMY EXCISION SUPRATENTORIAL BRAIN TUMOR Right 08/28/2018    Performed by [facility]is [facility]rio, MD at [facility]   ? CRANIECTOMY/ BONE FLAP CRANIOTOMY EXCISION SUPRATENTORIAL BRAIN TUMOR Right 01/08/2022    Performed by Evaristo Bury, MD at [facility]   ? CHOLECYSTECTOMY     ? HX APPENDECTOMY     ? HX HYSTERECTOMY     ? HX JOINT REPLACEMENT     ? HYSTERECTOMY       Social History     Socioeconomic History   ? Marital status: Widowed   ? Number of children: 1   Tobacco Use   ? Smoking status: Every Day     Packs/day: 0.25     Years: 30.00     Pack years: 7.50     Types: Cigarettes   ? Smokeless tobacco: Never   Vaping Use   ? Vaping Use:  Never used   Substance and Sexual Activity   ? Alcohol use: Yes     Comment: Seldom   ? Drug use: Never       Current Medications:  ? acetaminophen (TYLENOL) 325 mg tablet Take two tablets by mouth every 6 hours as needed for Pain.   ? apixaban (ELIQUIS) 5 mg tablet Take one tablet by mouth twice daily. Okay to restart on 01/22/22   ? calcium carbonate (TUMS) 500 mg (200 mg elemental calcium) chewable tablet Chew one tablet by mouth as Needed.   ? duloxetine DR (CYMBALTA) 60 mg capsule Take one capsule by mouth daily.   ? lamoTRIgine (LAMICTAL) 150 mg tablet Take one tablet by mouth twice daily for 180 days. Take one tablet twice a day with 25mg  tabs for a total of 125mg .   ? levothyroxine (SYNTHROID) 200 mcg tablet Take one tablet by mouth daily 30 minutes before breakfast.   ? lidocaine/prilocaine (EMLA) 2.5/2.5 % topical cream Apply one g topically to affected area as Needed.   ? losartan-hydrochlorothiazide (HYZAAR) 100-12.5 mg tablet Take one tablet by mouth every morning.   ? oxyCODONE (ROXICODONE) 5 mg tablet Take one tablet to two tablets by mouth every 6 hours as needed for Pain.   ? pregabalin (LYRICA) 75 mg capsule Take one capsule by mouth twice daily.   ? temozolomide (TEMODAR) 100 mg capsule Take one capsule (100 mg) by mouth at bedtime daily with 2 other temozolomide prescriptions for 125 mg per dose.  Indications: glioblastoma multiforme   ? temozolomide (TEMODAR) 20 mg capsule Take one capsule (20 mg) by mouth at bedtime daily with 2 other temozolomide prescriptions for 125 mg per dose.  Indications: glioblastoma multiforme   ? temozolomide (TEMODAR) 5 mg capsule Take one capsule (5 mg) by mouth at bedtime daily with 2 other temozolomide prescriptions for 125 mg per dose.  Indications: glioblastoma multiforme   ? vitamins, multiple cap Take one capsule by mouth daily.   ? zonisamide (ZONEGRAN) 100 mg capsule Take four capsules by mouth at bedtime daily for 180 days.       Allergies:  Allergies   Allergen Reactions   ? Morphine ANAPHYLAXIS   ? Codeine RASH   ? Erythromycin SHORTNESS OF BREATH   ? Latex RASH   ? Keppra [Levetiracetam] AGITATION       Vitals:  Vitals:    02/18/22 1053   PainSc: Zero     There is no height or weight on file to calculate BSA.    Exam   PREVIOUS IN PERSON    Domains    Gait 1 - Abnormal but walks without assistance    Strength 1- Movement present but decreased against resistance   Ataxia (upper extremities) 1- Able to finger to nose touch without difficulty   Sensation 0- Normal   Visual fields 1- Inconsistent or equivocal partial hemianopsia (>or= quadrantanopsia)   Facial strength 0- Normal   Language 2- Abnormal and difficulty conveying meaning to examiner   Level of consciousness 0- Normal   Behavior 0- Normal     TOTAL NANO SCORE: 6    ECOG PS = 2    Pain = 3/10    Well nourished, well developed, in no acute distress, sequelae of steroid use noted with Cushingoid appearance.    Mood and affect appear appropriate for the situation  Cognition appears slow, expressive aphasia  Somewhat Ambulatory at home    CN grossly intact 2-12 with the exception of dense  hemianopsia  Strength appears intact in all 4 extremities  Balance (ambulation) is wide-based      Pathological data:  Final Diagnosis:     A. Brain, right temporal tumor, right supratentorial craniotomy: ?   Consistent with recurrent high-grade glioma     B. Brain, right temporal tumor, right supratentorial craniotomy: ?   High-grade glioma, consistent with recurrence of glioblastoma,   IDH-wildtype, CNS WHO grade 4, MGMT promoter methylation present   See comment. ?     Comment:   Sections show a hypercellular glial neoplasm with atypia, mitotic   activity, microvascular proliferation & (tumor) necrosis. The tumor has   been previously characterized as IDH1 & 2 wild type (limited panel in   2019) & MGMT promoter methylation present. Please see [id number].     Comprehensive next-generation sequencing is pending on this specimen.   Please see medical record and/or addendum for results. ?         RADIOLOGICAL DATA:         IMPRESSION     1. New large right temporal lobectomy.   2. No adjacent abnormal contrast enhancement visible.   3. Old right parieto-occipital resection site.   4. FLAIR signal abnormality in right occipital, parietal and posterior   frontal lobes extending into internal and external capsules and residual   temporal lobe.   5. Narrowing of right lateral ventricle and mild midline shift to the   left.       ?Finalized by Thayer Headings, M.D. on 01/09/2022 10:01 AM. Dictated by Thayer Headings, M.D. on 01/09/2022 8:33 AM.     IMPRESSION/RECOMMENDATION:      Patient Active Problem List    Diagnosis Date Noted   ? Neoplasm of brain causing mass effect on adjacent structures (HCC) 01/05/2022   ? Partial symptomatic epilepsy with complex partial seizures, not intractable, without status epilepticus (HCC) 12/30/2019   ? 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 Glioblastoma (HCC) 09/13/2018   ? Impaired mobility and ADLs 08/31/2018   ? Impaired cognition 08/31/2018   ? HTN (hypertension) 08/31/2018   ? Hypothyroid 08/31/2018   ? 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  Imaging demonstrates a right inferior parietal enhancing mass with surrounding edema.    - November 2019 s/p craniotomy subtotal resection with Dr. Theotis Barrio with post op MRI showing residual peripheral enhancement along the anterior inferior origin of the resection bed  - January 2020 completed radiation with concurrent temozolomide (no depakote) ending in early January 2020.   - May 2021 completed 12 cycles of adjuvant temozolomide, adjusted to BID dosed due to her comorbidities.   -March 2023-recurred, high-grade glioma, IDH wild-type, methylation present    At this point, given the methylation status long time to recurrence, one of the options is to proceed with dose dense p.o. temozolomide at 50 mg/m?Marland Kitchen  It should be noted that in NGS, a high TMB approaching to 40 muts was noted, possible utilization of an ICI like pembrolizumab in the recurrent setting.  -We will order baseline CBC CMP and TSH  -Her total daily p.o. temozolomide dosed corrected to BSA of 2.5 will be 125 mg daily    02/18/22  We have discussed side effects of temozolomide including but not limited to myelosuppression, MDS, secondary malignancy, PCP pneumonia, hepatotoxicity, alopecia, fatigue, nausea, vomiting, headache, constipation, anorexia, convulsions, rash, hemiparesis, diarrhea, asthenia, fever, dizziness, coordination abnormal, viral infection, amnesia, and insomnia. The most common Grade 3 to 4 hematologic laboratory abnormalities (?10% incidence) that have developed during treatment with temozolomide are: lymphopenia, thrombocytopenia, neutropenia, and leukopenia. Allergic reactions have also been reported. Discussed daily dosing schedule and mechanisms to decrease side effects.       PLAN    -Labs q 2 weeks and will do weekly if platelets begin trending downward  -MRI 03/31/22  -follow up Dr. Neita Carp on 04/02/22    Andrea [name] M. Andrea Mcnee, APRN-FNP-C      The case has been extensively discussed with the patient and the family via tele health who verbalized understanding, questions were answered and a total of 45 minutes was spent on this day discussing and preparing about the diagnosis,prognosis, treatment options and toxicity profile as well as long-term outcomes.

## 2022-02-18 NOTE — Telephone Encounter
Oral Chemotherapy Counseling  Temozolomide (Temodar?)    SATRINA KIESCHNICK was provided medication education regarding her new oral chemotherapy.     Silvina is waiting on approval for copay assistance.  She received emailed paperwork and is in the process of sending that in.  Hopefully she will receive and start on Temodar later this week.  Pharmacy will check in about 10 days from now to assess start date and tolerance.    I reviewed the role of [place] specialty pharmacy, including access to medication assistance specialists if needed.     How to Take the Medication  KIMBALL VANDER was educated on temozolomide (Temodar?) for Glioblastoma; the indication for treatment, dose, route, frequency and duration of therapy were reviewed.     Directions:  Temozolomide (Temodar) 125 mg (one 100 mg cap + one 20 mg cap + one 5 mg cap) by mouth daily at bedtime ; continue until disease progression or unacceptable toxicity.    Patient was educated to swallow capsules whole and not to crush, chew or open capsules. May administer at bedtime to reduce nausea unless the provider would like the medication to be given prior to radiation.     How to Store Medication  LEKESHIA HUESTIS was educated to store temozolomide at room temperature in a safe place away from humidity, pets, and children.  I instructed the patient that it was okay to store temozolomide in a pill box, if needed.  I recommended if family members would be handling the medication, they should use gloves.  Additionally, I recommended cleaning any surfaces touched by temozolomide with bleach, if possible.     Adherence  The patient's ability to self-administer medication was assessed.    Patient was educated on the importance of adherence and that the consequences of non-adherence could include disease progression. The patient's ability to be adherent with drug therapies was discussed and the patient was provided options for tools/resources that promote adherence to therapy. For [employer], calendars, pillboxes, and technology (reminder apps) were recommended and/or provided.     How to Manage Missed Doses  I instructed the patient that if a dose is missed, she should take it as soon as she remembers on the same day and to return to normally scheduled doses the following day; however, not to take extra capsules to make up for the missed dose.  If capsules are vomited up, I recommended not taking additional doses that day.  Instead, resume the medication at the next scheduled dose.     Contraindications / Safety Precautions / Adverse Effects  Contraindications to therapy, safety precautions, and common adverse effects (listed below) were discussed with the patient.  I explained that most patients do NOT experience all these side effects and that this list was not inclusive.  I instructed patient to report any adverse effects to their doctor, pharmacist or nurse.     What You May Expect?  What Should You Do?    Constipation ? Eat a balanced diet with fiber (whole grains, fruit, and raw vegetables)  ? Drink plenty of fluids  ? Try light exercise  ? Speak to your doctor if you have not had a bowel movement in 3 or more days   Nausea and vomiting are common during treatment ? Drink clear fluids and avoid large meals. Get fresh air and rest.  ? Limit spicy, fried foods or foods with a strong smell.  ? Take antinausea drug(s) exactly as directed by your  doctor. It is easier to prevent nausea than to treat it.  ? Contact your doctor if nausea lasts more than 1 day or if any vomiting occurs.   Poor Appetite? Don't feel like eating? Weight loss ? Eat foods that you like and try to eat regular small meals.  ? Use meal supplements if possible. See a dietitian.   Diarrhea ? Drink plenty of clear fluids. Limit hot, spicy, fried foods, foods/drinks with caffeine, orange or prune juice.  ? Try a low fiber BRAT diet (Bananas, white Rice, Apple sauce, Toast made with white bread).  ? Take antidiarrhea drug(s) if given to you by your doctor.   Hair loss, thinning or a change in the texture of your hair may occur within a few days to weeks of treatment. ? Your hair will grow once your treatment is finished.   ? Use a gentle baby shampoo and soft brush.   ? Avoid hair spray, bleaches or perms.   Decreased blood counts, fevers, chills, infections (If given with other chemotherapy drugs)  Your health care providers will test your blood frequently to monitor your blood counts. ? Report any signs of infection, fever, and unusual bleeding or bruising.   ? Phone your doctor right away or go to the nearest emergency department, if your oral temperature is over 38?C or 100.4?F (unless stated otherwise by your healthcare team). Tell the healthcare team that you are on chemotherapy.  ? Check your temperature, especially if you are feeling unwell with sweats, fever or chills. Take your temperature before using acetaminophen (Tylenol?), since it may mask fever.  ? Check with your doctor before getting any vaccines.     Call and Seek Help Immediately If You Have:   Signs or symptoms of an allergic reaction, such as severe rash, any swelling of skin, face, lips or tongue, chest or throat tightness or difficulty breathing.     Contact Your Nurse or Doctor If You Have:   Fluid retention (generalized swelling or swelling of hands and feet), mouth sores, itching, yellowing of the skin or eyes, dark urine or pain in the upper right part of stomach, difficulty with coordination or balance, or any unusual bleeding from stomach that results in coughing up blood or black stools.     Vaccination Status Education   Appropriate recommended vaccinations were reviewed and discussed with the patient. The patient was also reminded about the importance of receiving an annual influenza vaccine as indicated.    REMS Program:   No REMS is required for this medication.    Drug-Drug Interactions:  A medication history and reconciliation was performed (including prescription medications, supplements, over the counter medications, and herbal products). The medication list was updated and the patient?s current medication list is included below.  I stressed the importance of maintaining an accurate medication list and informing their medical team prior to taking any new medications.     Home Medications    Medication Sig   acetaminophen (TYLENOL) 325 mg tablet Take two tablets by mouth every 6 hours as needed for Pain.   apixaban (ELIQUIS) 5 mg tablet Take one tablet by mouth twice daily. Okay to restart on 01/22/22   calcium carbonate (TUMS) 500 mg (200 mg elemental calcium) chewable tablet Chew one tablet by mouth as Needed.   duloxetine DR (CYMBALTA) 60 mg capsule Take one capsule by mouth daily.   lamoTRIgine (LAMICTAL) 150 mg tablet Take one tablet by mouth twice daily for 180 days. Take one  tablet twice a day with 25mg  tabs for a total of 125mg .   levothyroxine (SYNTHROID) 200 mcg tablet Take one tablet by mouth daily 30 minutes before breakfast.   lidocaine/prilocaine (EMLA) 2.5/2.5 % topical cream Apply one g topically to affected area as Needed.   losartan-hydrochlorothiazide (HYZAAR) 100-12.5 mg tablet Take one tablet by mouth every morning.   oxyCODONE (ROXICODONE) 5 mg tablet Take one tablet to two tablets by mouth every 6 hours as needed for Pain.   pregabalin (LYRICA) 75 mg capsule Take one capsule by mouth twice daily.   temozolomide (TEMODAR) 100 mg capsule Take one capsule (100 mg) by mouth at bedtime daily with 2 other temozolomide prescriptions for 125 mg per dose.  Indications: glioblastoma multiforme   temozolomide (TEMODAR) 20 mg capsule Take one capsule (20 mg) by mouth at bedtime daily with 2 other temozolomide prescriptions for 125 mg per dose.  Indications: glioblastoma multiforme   temozolomide (TEMODAR) 5 mg capsule Take one capsule (5 mg) by mouth at bedtime daily with 2 other temozolomide prescriptions for 125 mg per dose. Indications: glioblastoma multiforme   vitamins, multiple cap Take one capsule by mouth daily.   zonisamide (ZONEGRAN) 100 mg capsule Take four capsules by mouth at bedtime daily for 180 days.       Drug-drug and drug-food interactions with the new therapy were assessed and reviewed with the patient. No significant drug-drug interactions were identified.     Reproductive Concerns:  Reproductive concerns were reviewed with the patient. As patient is a female not of child-bearing potential, education was not applicable.     What to Do With Any Unused or Expired Medications  Appropriate safe handling and disposal procedures were reviewed with the patient. [name] was instructed to return any unused or expired oral chemotherapy medication to a designated disposal bin at one of the [facility] locations or to utilize a community drug take back program. Instructed not to flush down the toilet or to crush and dispose of medication in the trash.    Monitoring  Monitoring and follow-up plan was discussed with patient. [name] was instructed to contact the oral chemotherapy pharmacist at [phone] if they have any questions or concerns regarding their medication therapy. Informed the patient that we would send the prescription to a specialty pharmacy and that the pharmacy would be calling the patient to schedule a shipment.  Emphasized if their phone calls were not answered, the specialty pharmacy would not ship the medication.    This medication is considered high risk per our internal oral chemotherapy risk categorization and the patient will be contacted for education, toxicity check at 2 weeks, and reassessment every 3 months, if applicable (high risk monitoring).       Questions  Patient was given the opportunity to ask questions. Patient verbalized understanding, agreed with the plan and had no questions or concerns regarding therapy.     Ramond Craver, [facility]  Clinical Pharmacist  02/18/22

## 2022-02-18 NOTE — Progress Notes
Plan of Care:    Patient will return to clinic in June with new imaging.  MRI and follow-up already scheduled.

## 2022-02-19 ENCOUNTER — Encounter: Admit: 2022-02-19 | Discharge: 2022-02-19 | Payer: MEDICARE

## 2022-02-19 NOTE — Progress Notes
Copay assistance was obtained for the specialty medication temozolomide using the [place] and now the copay is $0.00.  Cornelia Copa has stated this copay is affordable.  The specialty pharmacy will pursue additional copay assistance as necessary.  The specialty pharmacy will reach out to the provider's nurse if the copay becomes unaffordable.    The medication will be delivered to patient's prescription address per the patient's request..    [place] Patient Advocate  [phone]

## 2022-02-20 MED FILL — TEMOZOLOMIDE 100 MG PO CAP: 100 mg | ORAL | 28 days supply | Qty: 28 | Fill #1 | Status: AC

## 2022-02-20 MED FILL — TEMOZOLOMIDE 20 MG PO CAP: 20 mg | ORAL | 28 days supply | Qty: 28 | Fill #1 | Status: AC

## 2022-02-20 MED FILL — TEMOZOLOMIDE 5 MG PO CAP: 5 mg | ORAL | 28 days supply | Qty: 28 | Fill #1 | Status: AC

## 2022-02-21 ENCOUNTER — Encounter: Admit: 2022-02-21 | Discharge: 2022-02-21 | Payer: MEDICARE

## 2022-02-24 ENCOUNTER — Encounter: Admit: 2022-02-24 | Discharge: 2022-02-24 | Payer: MEDICARE

## 2022-02-24 MED ORDER — ONDANSETRON HCL 8 MG PO TAB
ORAL_TABLET | ORAL | 1 refills | 8.00000 days | Status: AC
Start: 2022-02-24 — End: ?

## 2022-02-24 NOTE — Telephone Encounter
Pt LVM - asking about CPAP for OSA. LVM for pt asking her to put the number [phone], as a contact in her phone, and hopefully, will be able to contact pt on Wednesday, to follow up with request.

## 2022-02-27 ENCOUNTER — Encounter: Admit: 2022-02-27 | Discharge: 2022-02-27 | Payer: MEDICARE

## 2022-02-27 DIAGNOSIS — D496 Neoplasm of unspecified behavior of brain: Secondary | ICD-10-CM

## 2022-02-27 DIAGNOSIS — C719 Malignant neoplasm of brain, unspecified: Secondary | ICD-10-CM

## 2022-02-27 MED ORDER — PROCHLORPERAZINE MALEATE 10 MG PO TAB
10 mg | ORAL_TABLET | ORAL | 0 refills | Status: AC | PRN
Start: 2022-02-27 — End: ?

## 2022-02-27 MED ORDER — MAGNESIUM OXIDE 400 MG MAGNESIUM PO CAP
400 mg | ORAL_CAPSULE | Freq: Every day | ORAL | 0 refills | Status: AC
Start: 2022-02-27 — End: ?

## 2022-02-27 NOTE — Telephone Encounter
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  Cycle 1 start date was 02/21/22. Andrea [name] confirms she is taking the medication as prescribed - 125 mg daily at bedtime on an empty stomach.     Adverse Effects and Adherence Assessment   Andrea [name] is having the following adverse effects: nausea and vomiting, despite adding Zofran on 02/24/22 and taking it prior to Temodar and every 8 hours around the clock.  I requested and obtained a Compazine prescription to be added to her Zofran.  Andrea [name] can take Compazine (prochlorperazine) 10 mg by mouth every 6 hours as needed for nausea / vomiting.  The provider also suggested for Andrea [name] to take oral magnesium daily to prevent constipation while on Temodar + Zofran.  We sent a prescription for oral magnesium as well, although this can be purchased over-the-counter too.    None of the adverse effects were severe enough to interfere with adherence.     The patient's ability to self-administer medication was re-assessed. The patient has the ability to self-administer this medication. . Andrea [name] reports missing 0 doses since the start of therapy. she was re-educated on importance of adherence.    Dose Appropriateness Assessment  No dose adjustments based on toxicity are required at this time.  She will get labs every 2 weeks while on this regimen, and treatment will continue until progression or unacceptable toxicity.      CBC w/Diff    Lab Results   Component Value Date/Time    WBC 11.4 (H) 01/11/2022 04:05 AM    RBC 3.63 (L) 01/11/2022 04:05 AM    HGB 10.5 (L) 01/11/2022 04:05 AM    HCT 31.7 (L) 01/11/2022 04:05 AM    MCV 87.4 01/11/2022 04:05 AM    MCH 29.0 01/11/2022 04:05 AM    MCHC 33.3 01/11/2022 04:05 AM    RDW 15.1 (H) 01/11/2022 04:05 AM    PLTCT 265 01/11/2022 04:05 AM    MPV 7.4 01/11/2022 04:05 AM    Lab Results   Component Value Date/Time    NEUT 85 (H) 01/11/2022 04:05 AM    ANC 9.68 (H) 01/11/2022 04:05 AM    LYMA 11 (L) 01/11/2022 04:05 AM    ALC 1.27 01/11/2022 04:05 AM    MONA 4 01/11/2022 04:05 AM    AMC 0.41 01/11/2022 04:05 AM    EOSA 0 01/11/2022 04:05 AM    AEC 0.00 01/11/2022 04:05 AM    BASA 0 01/11/2022 04:05 AM    ABC 0.03 01/11/2022 04:05 AM        Comprehensive Metabolic Profile    Lab Results   Component Value Date/Time    NA 140 01/11/2022 04:05 AM    K 4.0 01/11/2022 04:05 AM    CL 104 01/11/2022 04:05 AM    CO2 26 01/11/2022 04:05 AM    GAP 10 01/11/2022 04:05 AM    BUN 21 01/11/2022 04:05 AM    CR 0.79 01/11/2022 04:05 AM    GLU 120 (H) 01/11/2022 04:05 AM    Lab Results   Component Value Date/Time    CA 8.8 01/11/2022 04:05 AM    PO4 3.8 01/09/2022 02:10 AM    ALBUMIN 3.2 (L) 01/11/2022 04:05 AM    TOTPROT 6.2 01/11/2022 04:05 AM    ALKPHOS 39 01/11/2022 04:05 AM    AST 19 01/11/2022 04:05 AM  ALT 23 01/11/2022 04:05 AM    TOTBILI 0.4 01/11/2022 04:05 AM    GFR >60 09/13/2019 10:40 PM    GFRAA >60 09/13/2019 10:40 PM            Creatinine clearance cannot be calculated (Patient's most recent lab result is older than the maximum 30 days allowed.)    Medication Reconciliation and Drug/Food Interaction Assessment  Andrea [name] reports 0 medication changes since her last medication history. she was instructed to speak with her healthcare provider and/or the oral chemotherapy pharmacist before starting any new drug, including, but not limited to, prescription, over the counter, natural / herbal products, or vitamins and supplements.     Drug-drug and drug-food interactions were assessed between the patients? specialty medication(s) and her most current medication list. No significant drug-drug interactions were identified.     Allergies   Allergen Reactions   ? Codeine RASH and SEE COMMENTS   ? Erythromycin SHORTNESS OF BREATH and SEE COMMENTS   ? Latex RASH and SEE COMMENTS     Other reaction(s): SEVERE RASH   ? Morphine ANAPHYLAXIS   ? Levetiracetam AGITATION and SEE COMMENTS     Agitation         Immunization Assessment   Immunization History   Administered Date(s) Administered   ? COVID-19 [facility]), mRNA vacc, 100 mcg/0.5 mL (PF) 01/14/2020, 02/11/2020, 12/13/2020   ? Covid-19 Bivalent (MODERNA), mRNA Vacc (Historical) 09/27/2021   ? DTaP vaccine IM (Infanrix) 07/31/1970, 02/21/1971, 03/18/1971, 03/16/1972   ? Flu Vaccine =>3 YO (Historical) 08/22/2019, 09/11/2020, 09/27/2021   ? Flu Vaccine =>6 Months Quadrivalent PF 09/04/2018   ? Flu vaccine, inj unspecified (Historical) 09/04/2018, 08/22/2019   ? HEPATITIS B vaccine, unspecified (Historical) 09/15/1989, 10/29/1989, 11/26/1990, 06/11/2004   ? MMR Vaccine 05/01/1971, 05/13/1975, 12/25/2004   ? OPV 07/31/1970, 02/21/1971, 02/22/1972, 01/01/1983   ? Pneumococcal Vaccine(13-Val Peds/immunocompromised adult) 09/20/2018   ? Td vaccine, unspecified (Historical) 02/05/1992, 06/05/2003   ? Tdap Vaccine 04/23/2017   ? Zoster Vaccine Recombinant, Adjuvanted (shingles) IM (vial 2 of 2)(SHINGRIX) 08/07/2020       Vaccine history and recommended vaccinations were reviewed and will be recommended as appropriate. The patient will be reminded about the importance of receiving an annual influenza vaccine as indicated.    Safety Assessment  Risk Evaluation and Mitigation Strategy (REMS)  No REMS is required for this medication.    Reproductive Risk  Andrea [name] is a 51 y.o. female. As patient is a female not of child-bearing potential, education was not applicable.     Handling and Disposal  Appropriate safe handling and disposal procedures were reviewed with the patient. Andrea [name] was instructed to return any unused or expired oral chemotherapy medication to a designated disposal bin at one of the [facility] locations or to utilize a community drug take back program. Instructed not to flush down the toilet or to crush the medication    Follow-up Plan   Andrea [name] was encouraged to call the oral chemotherapy pharmacist at [phone] with questions. This medication is considered high risk per our internal oral chemotherapy risk categorization. This re-assessment has been completed and the next reassessment planned for 3 months.    Brent Bulla, [facility]  Oncology Clinical Pharmacist  02/27/2022

## 2022-02-28 ENCOUNTER — Encounter: Admit: 2022-02-28 | Discharge: 2022-02-28 | Payer: MEDICARE

## 2022-03-04 ENCOUNTER — Encounter: Admit: 2022-03-04 | Discharge: 2022-03-04 | Payer: MEDICARE

## 2022-03-05 ENCOUNTER — Encounter: Admit: 2022-03-05 | Discharge: 2022-03-05 | Payer: MEDICARE

## 2022-03-11 ENCOUNTER — Encounter: Admit: 2022-03-11 | Discharge: 2022-03-11 | Payer: MEDICARE

## 2022-03-12 ENCOUNTER — Encounter: Admit: 2022-03-12 | Discharge: 2022-03-12 | Payer: MEDICARE

## 2022-03-12 MED ORDER — METOCLOPRAMIDE HCL 5 MG PO TAB
5 mg | ORAL_TABLET | Freq: Three times a day (TID) | ORAL | 0 refills | Status: AC | PRN
Start: 2022-03-12 — End: ?

## 2022-03-17 ENCOUNTER — Encounter: Admit: 2022-03-17 | Discharge: 2022-03-17 | Payer: MEDICARE

## 2022-03-18 ENCOUNTER — Encounter: Admit: 2022-03-18 | Discharge: 2022-03-18 | Payer: MEDICARE

## 2022-03-18 DIAGNOSIS — C719 Malignant neoplasm of brain, unspecified: Secondary | ICD-10-CM

## 2022-03-18 DIAGNOSIS — D496 Neoplasm of unspecified behavior of brain: Secondary | ICD-10-CM

## 2022-03-18 MED ORDER — TEMOZOLOMIDE 100 MG PO CAP
100 mg | ORAL_CAPSULE | Freq: Every evening | ORAL | 0 refills
Start: 2022-03-18 — End: ?

## 2022-03-18 MED ORDER — TEMOZOLOMIDE 5 MG PO CAP
5 mg | ORAL_CAPSULE | Freq: Every evening | ORAL | 0 refills
Start: 2022-03-18 — End: ?

## 2022-03-18 MED ORDER — TEMOZOLOMIDE 20 MG PO CAP
20 mg | ORAL_CAPSULE | Freq: Every evening | ORAL | 0 refills
Start: 2022-03-18 — End: ?

## 2022-03-18 NOTE — Oral Chemotherapy Note
Name:Kiley KATRYN PLUMMER           MRN: [id number]                 DOB:December 12, 1970          Age: 51 y.o.  Date of Service: 03/18/2022      Oral Chemotherapy Refill Review     Patient Information:     Correct patient/DOB/Diagnosis: Yes    Reviewed most recent clinic notes for changes in plan: Yes    Most recent labs reviewed: Yes    Appointment Information:   Date of last appointment: 02/13/2022    Follow up appointment scheduled : Yes 04/02/2022      Medication Information:     Medication name: temodar    Medication in [place]: Yes     Date of last refill released from [facility]: 02/13/2022    Weight change >10%: No    Has a dose adjustment been made since last refill?No    Is Pharmacy listed in [place] treatment plan? Yes    Pharmacy: [facility]    Consent Date verbal    Patient Contact  Method of communication: MyChart  Confirmed need for refill: Yes  Patient taking medication as directed Yes  Confirmation of receiving pharmacy: Yes  Patient is anticipating a start date of: ongoing    Other Information: PLT 286  [place] 3.03    Plan was routed to [facility]

## 2022-03-19 ENCOUNTER — Encounter: Admit: 2022-03-19 | Discharge: 2022-03-19 | Payer: MEDICARE

## 2022-03-19 DIAGNOSIS — C719 Malignant neoplasm of brain, unspecified: Secondary | ICD-10-CM

## 2022-03-19 DIAGNOSIS — D496 Neoplasm of unspecified behavior of brain: Secondary | ICD-10-CM

## 2022-03-19 MED ORDER — TEMOZOLOMIDE 100 MG PO CAP
100 mg | ORAL_CAPSULE | Freq: Every evening | ORAL | 0 refills | Status: CN
Start: 2022-03-19 — End: ?

## 2022-03-19 MED ORDER — TEMOZOLOMIDE 5 MG PO CAP
5 mg | ORAL_CAPSULE | Freq: Every evening | ORAL | 0 refills | Status: CN
Start: 2022-03-19 — End: ?

## 2022-03-19 MED ORDER — TEMOZOLOMIDE 20 MG PO CAP
20 mg | ORAL_CAPSULE | Freq: Every evening | ORAL | 0 refills | Status: CN
Start: 2022-03-19 — End: ?

## 2022-03-19 MED ORDER — IMS MIXTURE TEMPLATE
50 mg/m2 | Freq: Every evening | ORAL | 0 refills | Status: CN
Start: 2022-03-19 — End: ?

## 2022-03-19 MED ORDER — MAGNESIUM OXIDE 400 MG MAGNESIUM PO CAP
400 mg | ORAL_CAPSULE | Freq: Every day | ORAL | 0 refills | Status: AC
Start: 2022-03-19 — End: ?

## 2022-03-19 MED ORDER — PROCHLORPERAZINE MALEATE 10 MG PO TAB
10 mg | ORAL_TABLET | ORAL | 0 refills | Status: AC | PRN
Start: 2022-03-19 — End: ?

## 2022-03-19 MED ORDER — ONDANSETRON HCL 8 MG PO TAB
ORAL_TABLET | ORAL | 1 refills | 8.00000 days | Status: AC
Start: 2022-03-19 — End: ?

## 2022-03-19 NOTE — Progress Notes
Contacted Andrea [name] to discuss filling their medication: Temozolomide.  Left voicemail asking patient to return call to the specialty pharmacy [phone]).      [facility]  [phone]

## 2022-03-20 ENCOUNTER — Encounter: Admit: 2022-03-20 | Discharge: 2022-03-20 | Payer: MEDICARE

## 2022-03-21 MED FILL — TEMOZOLOMIDE 5 MG PO CAP: 5 mg | ORAL | 28 days supply | Qty: 28 | Fill #1 | Status: AC

## 2022-03-21 MED FILL — TEMOZOLOMIDE 20 MG PO CAP: 20 mg | ORAL | 28 days supply | Qty: 28 | Fill #1 | Status: AC

## 2022-03-21 MED FILL — TEMOZOLOMIDE 100 MG PO CAP: 100 mg | ORAL | 28 days supply | Qty: 28 | Fill #1 | Status: AC

## 2022-03-25 ENCOUNTER — Encounter: Admit: 2022-03-25 | Discharge: 2022-03-25 | Payer: MEDICARE

## 2022-03-28 ENCOUNTER — Encounter: Admit: 2022-03-28 | Discharge: 2022-03-28 | Payer: MEDICARE

## 2022-03-31 ENCOUNTER — Encounter: Admit: 2022-03-31 | Discharge: 2022-03-31 | Payer: MEDICARE

## 2022-03-31 NOTE — Telephone Encounter
Andrea [name] called and let me know that she has been extremely nauseous and vomiting for the past 3 days with no relief. She has tried Zofran, compazine and home remedies and nothing is working. She is very exhausted and dehydrated and feels very weak. I advised her to go to the ER at [facility] in [place]. She agreed and had no further questions at this time.

## 2022-04-01 ENCOUNTER — Encounter: Admit: 2022-04-01 | Discharge: 2022-04-01 | Payer: MEDICARE

## 2022-04-02 ENCOUNTER — Encounter: Admit: 2022-04-02 | Discharge: 2022-04-02 | Payer: MEDICARE

## 2022-04-03 ENCOUNTER — Encounter: Admit: 2022-04-03 | Discharge: 2022-04-03 | Payer: MEDICARE

## 2022-04-04 ENCOUNTER — Encounter: Admit: 2022-04-04 | Discharge: 2022-04-04 | Payer: MEDICARE

## 2022-04-04 NOTE — Progress Notes
AUTH NEEDED FOR EXTERNAL IMAGING    Patient info:  Name: Andrea [name]    MRN: [id number]          DOB: 1971/02/16      Insurance:   Payor: UHC MEDICARE / Plan: [place] [phone] / Product Type: Medicare /      Order sent to Facility: [facility]     Type of scan ordered:  MRI Head W/WO Contrast    Name of facility where test will be performed:  [facility]     Diagnosis ICD10 Code:  C71.9    Ordering Provider:  Nolon Nations     Scan needed by date:  ASAP

## 2022-04-04 NOTE — Progress Notes
MRI faxed to [facility] @ fax: [phone]. With e confirmation.

## 2022-04-08 ENCOUNTER — Encounter: Admit: 2022-04-08 | Discharge: 2022-04-08 | Payer: MEDICARE

## 2022-04-14 ENCOUNTER — Encounter: Admit: 2022-04-14 | Discharge: 2022-04-14 | Payer: MEDICARE

## 2022-04-14 DIAGNOSIS — C719 Malignant neoplasm of brain, unspecified: Secondary | ICD-10-CM

## 2022-04-14 DIAGNOSIS — D496 Neoplasm of unspecified behavior of brain: Secondary | ICD-10-CM

## 2022-04-14 MED ORDER — TEMOZOLOMIDE 20 MG PO CAP
20 mg | ORAL_CAPSULE | Freq: Every evening | ORAL | 0 refills
Start: 2022-04-14 — End: ?

## 2022-04-14 MED ORDER — TEMOZOLOMIDE 100 MG PO CAP
100 mg | ORAL_CAPSULE | Freq: Every evening | ORAL | 0 refills
Start: 2022-04-14 — End: ?

## 2022-04-14 MED ORDER — TEMOZOLOMIDE 5 MG PO CAP
5 mg | ORAL_CAPSULE | Freq: Every evening | ORAL | 0 refills
Start: 2022-04-14 — End: ?

## 2022-04-15 ENCOUNTER — Encounter: Admit: 2022-04-15 | Discharge: 2022-04-15 | Payer: MEDICARE

## 2022-04-15 NOTE — Telephone Encounter
Andrea [name] called to let us know she feels dehydrated and nauseous she was wondering if we can order fluids today. I called her back twice her VM box is full to let her know we need to see her today at 10:30 over tele-health.

## 2022-04-17 ENCOUNTER — Encounter: Admit: 2022-04-17 | Discharge: 2022-04-17 | Payer: MEDICARE

## 2022-04-17 DIAGNOSIS — C719 Malignant neoplasm of brain, unspecified: Secondary | ICD-10-CM

## 2022-04-17 DIAGNOSIS — D496 Neoplasm of unspecified behavior of brain: Secondary | ICD-10-CM

## 2022-04-17 MED ORDER — ONDANSETRON HCL 8 MG PO TAB
ORAL_TABLET | 1 refills | Status: CN
Start: 2022-04-17 — End: ?

## 2022-04-17 MED ORDER — MAGNESIUM OXIDE 400 MG MAGNESIUM PO CAP
400 mg | ORAL_CAPSULE | Freq: Every day | ORAL | 0 refills | Status: CN
Start: 2022-04-17 — End: ?

## 2022-04-17 MED ORDER — NALOXONE 4 MG/ACTUATION NA SPRY
NASAL | 0 refills | 1.00000 days | Status: AC
Start: 2022-04-17 — End: ?

## 2022-04-17 MED ORDER — IMS MIXTURE TEMPLATE
50 mg/m2 | Freq: Every evening | ORAL | 0 refills | Status: CN
Start: 2022-04-17 — End: ?

## 2022-04-17 MED ORDER — PROCHLORPERAZINE MALEATE 10 MG PO TAB
10 mg | ORAL_TABLET | ORAL | 0 refills | Status: CN | PRN
Start: 2022-04-17 — End: ?

## 2022-04-17 MED ORDER — LORAZEPAM 0.5 MG PO TAB
.5-1 mg | ORAL_TABLET | ORAL | 1 refills | 12.00000 days | Status: AC | PRN
Start: 2022-04-17 — End: ?

## 2022-04-17 NOTE — Oral Chemotherapy Note
Name:Andrea [name]           MRN: [id number]                 DOB:[birth year]          Age: 51 y.o.  Date of Service:04/17/22       Oral Chemotherapy Refill Review     Patient Information:     Correct patient/DOB/Diagnosis: yes    Reviewed most recent clinic notes for changes in plan:yes    Most recent labs reviewed: yes    Appointment Information:   Date of last appointment: 02/18/22    Follow up appointment scheduled :05/01/2022      Medication Information:     Medication name: Temodar    Medication in [place]: yes     Date of last refill released from [facility]: 5/24    Weight change >10%: no    Has a dose adjustment been made since last refill? no    Is Pharmacy listed in [place] treatment plan? yes    Pharmacy: [facility]    Consent Date verbal--will obtain written at next visit    Patient Contact  Method of communication: phone, by primary [place]  Confirmed need for refill: yes  Patient taking medication as directed yes  Confirmation of receiving pharmacy: yes  Patient is anticipating a start date of: continuation of therapy    Other Information: platelets 269k, ANC 4.36    Plan was routed to [facility]

## 2022-04-21 ENCOUNTER — Encounter: Admit: 2022-04-21 | Discharge: 2022-04-21 | Payer: MEDICARE

## 2022-04-21 DIAGNOSIS — C719 Malignant neoplasm of brain, unspecified: Secondary | ICD-10-CM

## 2022-04-21 DIAGNOSIS — D496 Neoplasm of unspecified behavior of brain: Secondary | ICD-10-CM

## 2022-04-21 MED ORDER — TEMOZOLOMIDE 100 MG PO CAP
100 mg | ORAL_CAPSULE | Freq: Every evening | ORAL | 0 refills
Start: 2022-04-21 — End: ?

## 2022-04-21 MED ORDER — TEMOZOLOMIDE 20 MG PO CAP
20 mg | ORAL_CAPSULE | Freq: Every evening | ORAL | 0 refills
Start: 2022-04-21 — End: ?

## 2022-04-21 MED ORDER — TEMOZOLOMIDE 5 MG PO CAP
5 mg | ORAL_CAPSULE | Freq: Every evening | ORAL | 0 refills
Start: 2022-04-21 — End: ?

## 2022-04-22 ENCOUNTER — Encounter: Admit: 2022-04-22 | Discharge: 2022-04-22 | Payer: MEDICARE

## 2022-04-22 DIAGNOSIS — C719 Malignant neoplasm of brain, unspecified: Secondary | ICD-10-CM

## 2022-04-22 DIAGNOSIS — D496 Neoplasm of unspecified behavior of brain: Secondary | ICD-10-CM

## 2022-04-22 MED ORDER — TEMOZOLOMIDE 100 MG PO CAP
100 mg | ORAL_CAPSULE | Freq: Every evening | ORAL | 0 refills | Status: DC
Start: 2022-04-22 — End: 2022-04-22
  Filled 2022-04-23: qty 28, 28d supply, fill #1

## 2022-04-22 MED ORDER — PROCHLORPERAZINE MALEATE 10 MG PO TAB
10 mg | ORAL_TABLET | ORAL | 0 refills | Status: AC | PRN
Start: 2022-04-22 — End: ?
  Filled 2022-04-23: 30d supply

## 2022-04-22 MED ORDER — PROCHLORPERAZINE MALEATE 10 MG PO TAB
10 mg | ORAL_TABLET | ORAL | 0 refills | Status: DC | PRN
Start: 2022-04-22 — End: 2022-04-22

## 2022-04-22 MED ORDER — IMS MIXTURE TEMPLATE
50 mg/m2 | Freq: Every evening | ORAL | 0 refills | Status: CN
Start: 2022-04-22 — End: ?

## 2022-04-22 MED ORDER — TEMOZOLOMIDE 5 MG PO CAP
5 mg | ORAL_CAPSULE | Freq: Every evening | ORAL | 0 refills | Status: DC
Start: 2022-04-22 — End: 2022-04-22

## 2022-04-22 MED ORDER — TEMOZOLOMIDE 5 MG PO CAP
5 mg | ORAL_CAPSULE | Freq: Every evening | ORAL | 0 refills | Status: AC
Start: 2022-04-22 — End: ?
  Filled 2022-04-23: qty 28, 28d supply, fill #1

## 2022-04-22 MED ORDER — TEMOZOLOMIDE 20 MG PO CAP
20 mg | ORAL_CAPSULE | Freq: Every evening | ORAL | 0 refills | Status: DC
Start: 2022-04-22 — End: 2022-04-22
  Filled 2022-04-23: qty 28, 28d supply, fill #1

## 2022-04-22 MED ORDER — MAGNESIUM OXIDE 400 MG MAGNESIUM PO CAP
400 mg | ORAL_CAPSULE | Freq: Every day | ORAL | 0 refills | Status: DC
Start: 2022-04-22 — End: 2022-04-22

## 2022-04-22 MED ORDER — ONDANSETRON HCL 8 MG PO TAB
ORAL_TABLET | ORAL | 1 refills | 8.00000 days | Status: AC
Start: 2022-04-22 — End: ?

## 2022-04-22 MED ORDER — TEMOZOLOMIDE 100 MG PO CAP
100 mg | ORAL_CAPSULE | Freq: Every evening | ORAL | 0 refills | Status: AC
Start: 2022-04-22 — End: ?

## 2022-04-22 MED ORDER — ONDANSETRON HCL 8 MG PO TAB
ORAL_TABLET | ORAL | 1 refills | 8.00000 days | Status: DC
Start: 2022-04-22 — End: 2022-04-22

## 2022-04-22 MED ORDER — TEMOZOLOMIDE 20 MG PO CAP
20 mg | ORAL_CAPSULE | Freq: Every evening | ORAL | 0 refills | Status: AC
Start: 2022-04-22 — End: ?

## 2022-04-22 MED ORDER — MAGNESIUM OXIDE 400 MG MAGNESIUM PO CAP
400 mg | ORAL_CAPSULE | Freq: Every day | ORAL | 0 refills | Status: CN
Start: 2022-04-22 — End: ?

## 2022-04-23 ENCOUNTER — Encounter: Admit: 2022-04-23 | Discharge: 2022-04-23 | Payer: MEDICARE

## 2022-04-24 ENCOUNTER — Encounter: Admit: 2022-04-24 | Discharge: 2022-04-24 | Payer: MEDICARE

## 2022-04-25 ENCOUNTER — Encounter: Admit: 2022-04-25 | Discharge: 2022-04-25 | Payer: MEDICARE

## 2022-04-25 DIAGNOSIS — C719 Malignant neoplasm of brain, unspecified: Secondary | ICD-10-CM

## 2022-04-25 NOTE — Telephone Encounter
Andrea [name] called to let us know that the past few days she has been having weird spells when she walks from outside to inside or from a bright room to a light room. She said she feels like she is going to pass out and drops to her knees but it goes away within a couple seconds. This is happening a couple times a day. She just had an mri this week so I will request the report and images from Aldona Lento for her appointment that is next week. [name] NP was made aware recommended reviewing MRI before next steps.

## 2022-04-28 ENCOUNTER — Encounter: Admit: 2022-04-28 | Discharge: 2022-04-28 | Payer: MEDICARE

## 2022-04-30 ENCOUNTER — Encounter: Admit: 2022-04-30 | Discharge: 2022-04-30 | Payer: MEDICARE

## 2022-04-30 MED ORDER — POTASSIUM CHLORIDE 20 MEQ PO TBTQ
40 meq | ORAL_TABLET | Freq: Every day | ORAL | 0 refills | 30.00000 days | Status: AC
Start: 2022-04-30 — End: ?

## 2022-05-01 ENCOUNTER — Encounter: Admit: 2022-05-01 | Discharge: 2022-05-01 | Payer: MEDICARE

## 2022-05-01 DIAGNOSIS — C719 Malignant neoplasm of brain, unspecified: Principal | ICD-10-CM

## 2022-05-01 NOTE — Progress Notes
AUTH NEEDED FOR EXTERNAL IMAGING    Patient info:  Name: Andrea [name]    MRN: [id number]          DOB: July 03, 1971      Insurance:  Payor: UHC MEDICARE / Plan: [place] [id number] / Product Type: Medicare /    Order sent to Facility: [facility]     Type of scan ordered:  MRI Head W/WO Contrast    Name of facility where test will be performed:  [facility]     Diagnosis ICD10 Code:  C71.9    Ordering Provider:  Nolon Nations     Scan needed by date:  07/16/2022

## 2022-05-01 NOTE — Progress Notes
Plan of care for appointment on 05/01/2022- MRI at [facility]. Faxed to [phone]. Fax was success. Follow up with [place] in September.

## 2022-05-01 NOTE — Progress Notes
[facility] [place] [facility]  Brain and Spine Tumor Program        Neuro-oncology Clinic Progress Note    Patient Name: Andrea [name]  DOB: [birth year]  MRN: [id number]  DOS: 05/01/2022      Subjective:   TANNISHA MENDES is a 51 y.o. 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 ?Coronavirus is limiting activity. ?Dense left hemianopsia persists. ?Her brief treatment history includes presentation with a few weeks of progressive confusion, bumping into things. ?Imaging demonstrates a right inferior parietal enhancing mass with surrounding edema. ?She underwent radiation with concurrent temozolomide (no depakote) ending in early January 2020. ?Began adjuvant temolomide, adjusted to BID dosed due to her comorbidities.??She has completed her 12 cycles of twice daily adjusted adjuvant p.o. temozolomide back in May 2021.  ?  She was last seen by back in October 15, 2021 with our PA-C.  ?  Recently, the patient presented to [facility] with new onset left-sided weakness, headache and recent fall.  MRI reveals progressive right temporal enhancing lesion.?    Unfortunately, the patient has had a recurrence in January 08, 2022, underwent STR ( subtotal resection) revealing similar IDH wild-type methylated glioblastoma recurrence with relatively longer time to recurrence.    Interval history:    The patient is now status post first cycle of dose dense p.o. temozolomide at 50 mg/m?, she had been tolerating it well with grade 1 fatigue and grade 1 constipation. H&H is 9.8 and 31.3 respectively.  MCV is 86.7.  Platelet counts are 231 K.  WBC is 6.3 with normal WBC differential with an ANC of 3.85.  CMP is unimpressive without any evidence of transaminitis or renal dysfunction.    Medical History:   Diagnosis Date   ? ADHD    ? Depression    ? Disorder of thyroid gland    ? Hypertension      Surgical History:   Procedure Laterality Date   ? Right craniotomy for resection of tumor Right 08/27/2018    Performed by Theotis Barrio, MD at [facility] OR   ? MICROSURGICAL TECHNIQUES - REQUIRING OPERATING MICROSCOPE USE N/A 08/27/2018    Performed by Theotis Barrio, MD at [facility] OR   ? STEREOTACTIC COMPUTER-ASSISTED CRANIAL PROCEDURE - INTRADURAL N/A 08/27/2018    Performed by Theotis Barrio, MD at [facility] OR   ? ULTRASOUND GUIDANCE - INTRAOPERATIVE  08/27/2018    Performed by Theotis Barrio, MD at [facility] OR   ? CRANIECTOMY/ BONE FLAP CRANIOTOMY EXCISION SUPRATENTORIAL BRAIN TUMOR Right 08/28/2018    Performed by Theotis Barrio, MD at [facility] OR   ? CRANIECTOMY/ BONE FLAP CRANIOTOMY EXCISION SUPRATENTORIAL BRAIN TUMOR Right 01/08/2022    Performed by Evaristo Bury, MD at [facility] OR   ? CHOLECYSTECTOMY     ? HX APPENDECTOMY     ? HX HYSTERECTOMY     ? HX JOINT REPLACEMENT     ? HYSTERECTOMY       Social History     Socioeconomic History   ? Marital status: Widowed   ? Number of  children: 1   Tobacco Use   ? Smoking status: Every Day     Packs/day: 0.25     Years: 30.00     Pack years: 7.50     Types: Cigarettes   ? Smokeless tobacco: Never   Vaping Use   ? Vaping Use: Never used   Substance and Sexual Activity   ? Alcohol use: Yes     Comment: Seldom   ? Drug use: Never       Current Medications:  ? acetaminophen (TYLENOL) 325 mg tablet Take two tablets by mouth every 6 hours as needed for Pain.   ? apixaban (ELIQUIS) 5 mg tablet Take one tablet by mouth twice daily. Okay to restart on 01/22/22   ? calcium carbonate (TUMS) 500 mg (200 mg elemental calcium) chewable tablet Chew one tablet by mouth as Needed.   ? duloxetine DR (CYMBALTA) 60 mg capsule Take one capsule by mouth daily.   ? lamoTRIgine (LAMICTAL) 150 mg tablet Take one tablet by mouth twice daily for 180 days. Take one tablet twice a day with 25mg  tabs for a total of 125mg .   ? levothyroxine (SYNTHROID) 200 mcg tablet Take one tablet by mouth daily 30 minutes before breakfast.   ? lidocaine/prilocaine (EMLA) 2.5/2.5 % topical cream Apply one g topically to affected area as Needed.   ? LORazepam (ATIVAN) 0.5 mg tablet Take one tablet to two tablets by mouth every 8 hours as needed for Nausea or Vomiting. Indications: nausea and vomiting caused by cancer drugs   ? losartan-hydrochlorothiazide (HYZAAR) 100-12.5 mg tablet Take one tablet by mouth every morning.   ? magnesium oxide 400 mg magnesium capsule Take one capsule by mouth daily. Indications: to prevent constipation while on Zofran   ? naloxone (NARCAN) 4 mg/actuation nasal spray Insert 1 spray into 1 nostril as needed for signs of opioid overdose then call 911. May repeat dose every 2-3 minutes (alternate nostrils) until medical team arrives.  Indications: opioid overdose   ? ondansetron HCL (ZOFRAN) 8 mg tablet Take 1-2 tablets (8-16 mg) one hour prior to daily Temodar doses to prevent nausea/vomiting.  May repeat 1 tablet (8 mg) every 8 hours as needed for breakthrough nausea/vomiting.  Do not take more than 3 tablets in a 24-hour period.   ? oxyCODONE (ROXICODONE) 5 mg tablet Take one tablet to two tablets by mouth every 6 hours as needed for Pain.   ? potassium chloride SR (KLOR-CON M20) 20 mEq tablet Take two tablets by mouth daily.   ? pregabalin (LYRICA) 75 mg capsule Take one capsule by mouth twice daily.   ? prochlorperazine maleate (COMPAZINE) 10 mg tablet Take one tablet by mouth every 6 hours as needed for Nausea or Vomiting. Indications: nausea and vomiting caused by cancer drugs   ? temozolomide (TEMODAR) 100 mg capsule Take one capsule (100 mg) by mouth at bedtime daily with 2 other temozolomide prescriptions for 125 mg per dose.  Indications: glioblastoma multiforme   ? temozolomide (TEMODAR) 20 mg capsule Take one capsule (20 mg) by mouth at bedtime daily with 2 other temozolomide prescriptions for 125 mg per dose.  Indications: glioblastoma multiforme   ? temozolomide (TEMODAR) 5 mg capsule Take one capsule (5 mg) by mouth at bedtime daily with 2 other temozolomide prescriptions for 125 mg per dose.  Indications: glioblastoma multiforme   ? vitamins, multiple cap Take one capsule by mouth daily.   ? zonisamide (ZONEGRAN) 100 mg capsule Take four capsules by mouth at  bedtime daily for 180 days.       Allergies:  Allergies   Allergen Reactions   ? Codeine RASH and SEE COMMENTS   ? Erythromycin SHORTNESS OF BREATH and SEE COMMENTS   ? Latex RASH and SEE COMMENTS     Other reaction(s): SEVERE RASH   ? Morphine ANAPHYLAXIS   ? Levetiracetam RASH, AGITATION and SEE COMMENTS     Other reaction(s): Aggressive behavior       Vitals:  There were no vitals filed for this visit.  There is no height or weight on file to calculate BSA.    Exam   PREVIOUS IN PERSON    Domains    Gait 1 - Abnormal but walks without assistance    Strength 1- Movement present but decreased against resistance   Ataxia (upper extremities) 1- Able to finger to nose touch without difficulty   Sensation 0- Normal   Visual fields 1- Inconsistent or equivocal partial hemianopsia (>or= quadrantanopsia)   Facial strength 0- Normal   Language 2- Abnormal and difficulty conveying meaning to examiner   Level of consciousness 0- Normal   Behavior 0- Normal     TOTAL NANO SCORE: 6    ECOG PS = 2    Pain = 3/10    Well nourished, well developed, in no acute distress, sequelae of steroid use noted with Cushingoid appearance.    Mood and affect appear appropriate for the situation  Cognition appears slow, expressive aphasia  Somewhat Ambulatory at home    CN grossly intact 2-12 with the exception of dense hemianopsia  Strength appears intact in all 4 extremities  Balance (ambulation) is wide-based      Pathological data:  Final Diagnosis:     A. Brain, right temporal tumor, right supratentorial craniotomy: ?   Consistent with recurrent high-grade glioma     B. Brain, right temporal tumor, right supratentorial craniotomy: ?   High-grade glioma, consistent with recurrence of glioblastoma,   IDH-wildtype, CNS WHO grade 4, MGMT promoter methylation present   See comment. ?     Comment:   Sections show a hypercellular glial neoplasm with atypia, mitotic   activity, microvascular proliferation & (tumor) necrosis. The tumor has   been previously characterized as IDH1 & 2 wild type (limited panel in   2019) & MGMT promoter methylation present. Please see [id number].     Comprehensive next-generation sequencing is pending on this specimen.   Please see medical record and/or addendum for results. ?         RADIOLOGICAL DATA:     MRI performed at [facility] [place] [facility] on 04/22/2019 revealed postop changes as discussed above with 2 resection cavities in the right temporal lobe region and within the right posterior parietal occipital lobe between these 2 resection cavities there is abnormal enhancement and T2 signal change without diffusion restriction.  The above findings could be from posttreatment changes however could not exclude residual tumor and follow-up recommended in 6 to 10 weeks.  There is no masslike enhancement or significant mass effect.  There is one region which could potentially represent infarction or residual tumor to posterior to the right resection cavity which reveals mild restricted diffusion.          IMPRESSION/RECOMMENDATION:      Patient Active Problem List    Diagnosis Date Noted   ? Neoplasm of brain causing mass effect on adjacent structures (HCC) 01/05/2022   ? Partial symptomatic epilepsy with  complex partial seizures, not intractable, without status epilepticus (HCC) 12/30/2019   ? 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 Glioblastoma (HCC) 09/13/2018   ? Impaired mobility and ADLs 08/31/2018   ? Impaired cognition 08/31/2018   ? HTN (hypertension) 08/31/2018   ? Hypothyroid 08/31/2018   ? 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  Imaging demonstrates a right inferior parietal enhancing mass with surrounding edema.    - November 2019 s/p craniotomy subtotal resection with Dr. Theotis Barrio with post op MRI showing residual peripheral enhancement along the anterior inferior origin of the resection bed  - January 2020 completed radiation with concurrent temozolomide (no depakote) ending in early January 2020.   - May 2021 completed 12 cycles of adjuvant temozolomide, adjusted to BID dosed due to her comorbidities.   -March 2023-recurred, high-grade glioma, IDH wild-type, methylation present    At this point, given the methylation status long time to recurrence, one of the options is to proceed with dose dense p.o. temozolomide at 50 mg/m?Marland Kitchen    It should be NOTED that in NGS, an unusually high TMB approaching to 40 muts was noted, possible utilization of an ICI like pembrolizumab in the recurrent setting.    -CBC CMP and TSH, monitor hypokalemia    -She needs a full work-up for postural hypotension I have recommended initiated with PCP, keep hydrated, check potassium status intermittently    -Her total daily p.o. temozolomide dosed corrected to BSA of 2.5 is 125 mg daily    -RTC in 10 weeks with an interval MRI      The case has been extensively discussed with the patient and the family in the room who verbalized understanding, questions were answered and a total of 35 minutes was spent on this day discussing and preparing about the diagnosis,prognosis, treatment options and toxicity profile as well as long-term outcomes.     Gita Kudo, MD   Neuro-Oncology/Medical Oncology

## 2022-05-01 NOTE — Telephone Encounter
On-Call Hematology/Oncology Fellow Telephone Note    I received a call from the lab reporting patient has critical lab of potassium is 2.7.  I called the patient and updated the results.  Patient denies any diarrhea, she reported being tired and having poor p.o. intake over the past few days.        Hypokalemia:  -Potassium of 2.7  Plan:  Ordered potassium chloride 60 mEq to be taken tonight   And KCL 20 mEq tomorrow morning  Patient has scheduled appointment tomorrow via telehealth to follow-up in the clinic     [name] M.D.  Hematology/Oncology Fellow - PGY-6

## 2022-05-02 ENCOUNTER — Encounter: Admit: 2022-05-02 | Discharge: 2022-05-02 | Payer: MEDICARE

## 2022-05-12 ENCOUNTER — Encounter: Admit: 2022-05-12 | Discharge: 2022-05-12 | Payer: MEDICARE

## 2022-05-12 DIAGNOSIS — C719 Malignant neoplasm of brain, unspecified: Secondary | ICD-10-CM

## 2022-05-12 DIAGNOSIS — D496 Neoplasm of unspecified behavior of brain: Secondary | ICD-10-CM

## 2022-05-12 MED ORDER — TEMOZOLOMIDE 20 MG PO CAP
20 mg | ORAL_CAPSULE | Freq: Every evening | ORAL | 0 refills
Start: 2022-05-12 — End: ?

## 2022-05-12 MED ORDER — TEMOZOLOMIDE 5 MG PO CAP
5 mg | ORAL_CAPSULE | Freq: Every evening | ORAL | 0 refills
Start: 2022-05-12 — End: ?

## 2022-05-12 MED ORDER — TEMOZOLOMIDE 100 MG PO CAP
100 mg | ORAL_CAPSULE | Freq: Every evening | ORAL | 0 refills
Start: 2022-05-12 — End: ?

## 2022-05-14 ENCOUNTER — Encounter: Admit: 2022-05-14 | Discharge: 2022-05-14 | Payer: MEDICARE

## 2022-05-15 ENCOUNTER — Encounter: Admit: 2022-05-15 | Discharge: 2022-05-15 | Payer: MEDICARE

## 2022-05-15 DIAGNOSIS — C719 Malignant neoplasm of brain, unspecified: Secondary | ICD-10-CM

## 2022-05-20 ENCOUNTER — Encounter: Admit: 2022-05-20 | Discharge: 2022-05-20 | Payer: MEDICARE

## 2022-05-21 ENCOUNTER — Encounter: Admit: 2022-05-21 | Discharge: 2022-05-21 | Payer: MEDICARE

## 2022-05-21 MED ORDER — APIXABAN 5 MG PO TAB
5 mg | ORAL_TABLET | Freq: Two times a day (BID) | ORAL | 0 refills | Status: AC
Start: 2022-05-21 — End: ?

## 2022-05-21 NOTE — Progress Notes
51 yr old Female  Would like to speak with Neuro Oncology.  Patient follows with Dr. Charise Killian.  Hx of Glioblastoma, Takes oral chemo.  Increased vertigo, n/v/ persistent since last night  Generally unwell appearing  Mental Status, GCS 15  C/O Dull Headache, dizziness, n/v    Imaging: Head CT 6.4 x 4.9 x 6.2  posterior right frontal and parietal mass that unsure if it is new or not.  There is Mass effect at the right ventricle and mild left midline shift.    Labs:Lytes normal, No Leukocytosis    Meds:Decadron 10, Fentanyl 50, Ativan 1    Vitals: 112/93  98.4  94  20  96% RA    Procedure:    Hx:    Reason for transfer:Current Patient , [place]  Requested Images to be clouded.

## 2022-05-22 ENCOUNTER — Encounter: Admit: 2022-05-22 | Discharge: 2022-05-22 | Payer: MEDICARE

## 2022-05-22 DIAGNOSIS — C719 Malignant neoplasm of brain, unspecified: Secondary | ICD-10-CM

## 2022-05-22 DIAGNOSIS — D496 Neoplasm of unspecified behavior of brain: Secondary | ICD-10-CM

## 2022-05-22 MED ORDER — TEMOZOLOMIDE 100 MG PO CAP
100 mg | ORAL_CAPSULE | Freq: Every evening | ORAL | 0 refills | Status: AC
Start: 2022-05-22 — End: ?

## 2022-05-22 MED ORDER — TEMOZOLOMIDE 5 MG PO CAP
5 mg | ORAL_CAPSULE | Freq: Every evening | ORAL | 0 refills | Status: AC
Start: 2022-05-22 — End: ?

## 2022-05-22 MED ORDER — PROCHLORPERAZINE MALEATE 10 MG PO TAB
10 mg | ORAL_TABLET | ORAL | 0 refills | Status: AC | PRN
Start: 2022-05-22 — End: ?
  Filled 2022-05-22: qty 120, 30d supply, fill #1

## 2022-05-22 MED ORDER — MAGNESIUM OXIDE 400 MG MAGNESIUM PO CAP
400 mg | ORAL_CAPSULE | Freq: Every day | ORAL | 0 refills | Status: CN
Start: 2022-05-22 — End: ?

## 2022-05-22 MED ORDER — ONDANSETRON HCL 8 MG PO TAB
ORAL_TABLET | ORAL | 1 refills | 8.00000 days | Status: AC
Start: 2022-05-22 — End: ?
  Filled 2022-05-22: qty 60, 20d supply, fill #1

## 2022-05-22 MED ORDER — TEMOZOLOMIDE 20 MG PO CAP
20 mg | ORAL_CAPSULE | Freq: Every evening | ORAL | 0 refills | Status: AC
Start: 2022-05-22 — End: ?

## 2022-05-22 MED ORDER — IMS MIXTURE TEMPLATE
50 mg/m2 | Freq: Every evening | ORAL | 0 refills | Status: CN
Start: 2022-05-22 — End: ?

## 2022-05-22 NOTE — Progress Notes
Plan: Support     Interaction: SW was notified pt has questions regarding hospice. SW called pt and left VM with SW contact number: [place], LMSW

## 2022-05-22 NOTE — Telephone Encounter
-----   Message from Thea Silversmith, [place] sent at 05/22/2022  2:56 PM CDT -----  Regarding: NEUROL Follow-Up  Please call and schedule patient for Dr. Charise Killian or Hoyle Sauer Cook's next available telehealth visit.  Thank you!

## 2022-05-22 NOTE — Telephone Encounter
Called to get patient scheduled for telehealth appt with Dr. Charise Killian or Jerline Pain. No answer, left VM for patient to call us back to get scheduled for that appt.

## 2022-05-22 NOTE — Oral Chemotherapy Note
Name:Cayci KAELY HOLLAN           MRN: [id number]                 DOB:November 17, 1970          Age: 51 y.o.  Date of Service: 05/22/2022    Oral Chemotherapy Refill Review     Patient Information:     Correct patient/DOB/Diagnosis: yes    Reviewed most recent clinic notes for changes in plan:yes    Most recent labs reviewed: yes    Appointment Information:   Date of last appointment: 05/01/2022    Follow up appointment scheduled :07/24/2022              Medication Information:     Medication name: Temodar    Medication in [place] Plan: yes      Date of last refill released from [facility]: 04/22/2022    Weight change >10%: no    Has a dose adjustment been made since last refill? no    Is Pharmacy listed in [facility] treatment plan? yes    Pharmacy: [facility]    Consent Date verbal--will obtain written at next visit    Patient Contact  Method of communication:mychart  Confirmed need for refill: yes  Patient taking medication as directed yes  Confirmation of receiving pharmacy: yes  Patient is anticipating a start date of: continuation of therapy    Other Information: platelets 224 and ANC 3.0    Plan was routed to [facility]

## 2022-05-23 ENCOUNTER — Encounter: Admit: 2022-05-23 | Discharge: 2022-05-23 | Payer: MEDICARE

## 2022-05-23 NOTE — Progress Notes
Contacted Andrea [name] to discuss filling their medication: temozolomide.  Left voicemail asking patient to return call to the specialty pharmacy [phone]).       Andrea [name] Patient [facility]  [phone]

## 2022-05-23 NOTE — Telephone Encounter
-----   Message from Chantal Kildea, RN sent at 05/22/2022  2:56 PM CDT -----  Regarding: NEUROL Follow-Up  Please call and schedule patient for Dr. Tuncer or Carolyn Cook's next available telehealth visit.  Thank you!

## 2022-05-23 NOTE — Telephone Encounter
Called to get patient scheduled for telehealth appt with Dr. Charise Killian or Jerline Pain. No answer, sent patient a MyChart message letting her know she is now scheduled for telehealth visit with C. Lacinda Axon 8/01.

## 2022-05-25 ENCOUNTER — Encounter: Admit: 2022-05-25 | Discharge: 2022-05-25 | Payer: MEDICARE

## 2022-05-26 ENCOUNTER — Encounter: Admit: 2022-05-26 | Discharge: 2022-05-26 | Payer: MEDICARE

## 2022-05-27 ENCOUNTER — Encounter: Admit: 2022-05-27 | Discharge: 2022-05-27 | Payer: MEDICARE

## 2022-06-02 ENCOUNTER — Encounter: Admit: 2022-06-02 | Discharge: 2022-06-02 | Payer: MEDICARE

## 2022-06-02 NOTE — Progress Notes
Plan: follow up     Interaction: SW f/u with [facility]: pt stated on their screen that she did have a life insurance but stated unsure of value and insurance company name. SW attempted to call pts dgt: phone did not go through. SW text dgt to provide info.     Jodene Nam, LMSW

## 2022-06-13 ENCOUNTER — Encounter: Admit: 2022-06-13 | Discharge: 2022-06-13 | Payer: MEDICARE

## 2022-06-19 ENCOUNTER — Encounter: Admit: 2022-06-19 | Discharge: 2022-06-19 | Payer: MEDICARE

## 2022-06-23 ENCOUNTER — Encounter: Admit: 2022-06-23 | Discharge: 2022-06-23 | Payer: MEDICARE

## 2022-06-27 DEATH — deceased

## 2022-07-22 ENCOUNTER — Encounter: Admit: 2022-07-22 | Discharge: 2022-07-22 | Payer: Commercial Managed Care - Pharmacy Benefit Manager

## 2022-07-22 NOTE — Progress Notes
POST-MORTEM DOCUMENTATION    Name: Andrea [name]   MRN: [id number]     DOB: 1971-10-03      Age: 51 y.o.  Admission Date: (Not on file)     LOS: 0 days     Date of Service: 2022-07-24      Date of death if known: 06-08-22   Location of death, if known:Other  How were you notified?  [facility]  Who notified us of death?     Was hospice involved? Unknown  Name of hospice involved, if known:   Date of hospice admission, if known:     Other information:

## 2023-03-21 ENCOUNTER — Encounter: Admit: 2023-03-21 | Discharge: 2023-03-21 | Payer: Commercial Managed Care - Pharmacy Benefit Manager

## 2024-06-23 ENCOUNTER — Encounter: Admit: 2024-06-23 | Discharge: 2024-06-23 | Payer: Commercial Managed Care - Pharmacy Benefit Manager
